# Patient Record
Sex: Female | Born: 1935
Health system: Southern US, Community
[De-identification: ages and names within clinical notes are randomized; demographics above are authoritative.]

## PROBLEM LIST (undated history)

## (undated) DIAGNOSIS — K219 Gastro-esophageal reflux disease without esophagitis: Secondary | ICD-10-CM

## (undated) DIAGNOSIS — E119 Type 2 diabetes mellitus without complications: Secondary | ICD-10-CM

## (undated) DIAGNOSIS — I10 Essential (primary) hypertension: Secondary | ICD-10-CM

## (undated) DIAGNOSIS — T7840XA Allergy, unspecified, initial encounter: Secondary | ICD-10-CM

## (undated) DIAGNOSIS — C50919 Malignant neoplasm of unspecified site of unspecified female breast: Secondary | ICD-10-CM

## (undated) DIAGNOSIS — Z923 Personal history of irradiation: Secondary | ICD-10-CM

## (undated) DIAGNOSIS — Z973 Presence of spectacles and contact lenses: Secondary | ICD-10-CM

## (undated) DIAGNOSIS — M1990 Unspecified osteoarthritis, unspecified site: Secondary | ICD-10-CM

## (undated) HISTORY — PX: TONSILLECTOMY: SUR1361

## (undated) HISTORY — DX: Essential (primary) hypertension: I10

## (undated) HISTORY — PX: KNEE SURGERY: SHX244

## (undated) HISTORY — PX: COLONOSCOPY: SHX174

## (undated) HISTORY — DX: Allergy, unspecified, initial encounter: T78.40XA

## (undated) HISTORY — DX: Unspecified osteoarthritis, unspecified site: M19.90

## (undated) HISTORY — DX: Malignant neoplasm of unspecified site of unspecified female breast: C50.919

## (undated) HISTORY — PX: BREAST LUMPECTOMY: SHX2

## (undated) HISTORY — PX: DILATION AND CURETTAGE OF UTERUS: SHX78

## (undated) HISTORY — DX: Type 2 diabetes mellitus without complications: E11.9

---

## 1998-03-01 ENCOUNTER — Other Ambulatory Visit: Admission: RE | Admit: 1998-03-01 | Discharge: 1998-03-01 | Payer: Self-pay | Admitting: Internal Medicine

## 1998-05-18 ENCOUNTER — Ambulatory Visit (HOSPITAL_COMMUNITY): Admission: RE | Admit: 1998-05-18 | Discharge: 1998-05-18 | Payer: Self-pay | Admitting: Gastroenterology

## 1998-11-24 ENCOUNTER — Emergency Department (HOSPITAL_COMMUNITY): Admission: EM | Admit: 1998-11-24 | Discharge: 1998-11-24 | Payer: Self-pay | Admitting: Emergency Medicine

## 2000-01-02 ENCOUNTER — Encounter: Payer: Self-pay | Admitting: Internal Medicine

## 2000-01-02 ENCOUNTER — Encounter: Admission: RE | Admit: 2000-01-02 | Discharge: 2000-01-02 | Payer: Self-pay | Admitting: Internal Medicine

## 2000-01-09 ENCOUNTER — Encounter: Admission: RE | Admit: 2000-01-09 | Discharge: 2000-01-09 | Payer: Self-pay | Admitting: Internal Medicine

## 2000-01-09 ENCOUNTER — Encounter: Payer: Self-pay | Admitting: Internal Medicine

## 2000-01-15 ENCOUNTER — Encounter: Admission: RE | Admit: 2000-01-15 | Discharge: 2000-04-14 | Payer: Self-pay | Admitting: Internal Medicine

## 2001-08-19 ENCOUNTER — Encounter: Payer: Self-pay | Admitting: Internal Medicine

## 2001-08-19 ENCOUNTER — Encounter: Admission: RE | Admit: 2001-08-19 | Discharge: 2001-08-19 | Payer: Self-pay | Admitting: Internal Medicine

## 2002-11-11 ENCOUNTER — Encounter: Payer: Self-pay | Admitting: Internal Medicine

## 2002-11-11 ENCOUNTER — Encounter: Admission: RE | Admit: 2002-11-11 | Discharge: 2002-11-11 | Payer: Self-pay | Admitting: Internal Medicine

## 2003-05-03 ENCOUNTER — Ambulatory Visit (HOSPITAL_COMMUNITY): Admission: RE | Admit: 2003-05-03 | Discharge: 2003-05-03 | Payer: Self-pay | Admitting: Gastroenterology

## 2003-05-03 ENCOUNTER — Encounter (INDEPENDENT_AMBULATORY_CARE_PROVIDER_SITE_OTHER): Payer: Self-pay

## 2003-10-22 ENCOUNTER — Encounter: Admission: RE | Admit: 2003-10-22 | Discharge: 2003-10-22 | Payer: Self-pay | Admitting: Internal Medicine

## 2004-08-06 HISTORY — PX: FRACTURE SURGERY: SHX138

## 2005-05-14 ENCOUNTER — Encounter: Admission: RE | Admit: 2005-05-14 | Discharge: 2005-05-14 | Payer: Self-pay | Admitting: Internal Medicine

## 2005-05-24 ENCOUNTER — Encounter: Admission: RE | Admit: 2005-05-24 | Discharge: 2005-05-24 | Payer: Self-pay | Admitting: Internal Medicine

## 2006-05-20 ENCOUNTER — Encounter: Admission: RE | Admit: 2006-05-20 | Discharge: 2006-05-20 | Payer: Self-pay | Admitting: Internal Medicine

## 2006-06-03 ENCOUNTER — Encounter: Admission: RE | Admit: 2006-06-03 | Discharge: 2006-06-03 | Payer: Self-pay | Admitting: Internal Medicine

## 2007-05-22 ENCOUNTER — Encounter: Admission: RE | Admit: 2007-05-22 | Discharge: 2007-05-22 | Payer: Self-pay | Admitting: Internal Medicine

## 2008-05-24 ENCOUNTER — Encounter: Admission: RE | Admit: 2008-05-24 | Discharge: 2008-05-24 | Payer: Self-pay | Admitting: Internal Medicine

## 2009-06-02 ENCOUNTER — Encounter: Admission: RE | Admit: 2009-06-02 | Discharge: 2009-06-02 | Payer: Self-pay | Admitting: Internal Medicine

## 2010-06-06 ENCOUNTER — Encounter: Admission: RE | Admit: 2010-06-06 | Discharge: 2010-06-06 | Payer: Self-pay | Admitting: Internal Medicine

## 2010-08-26 ENCOUNTER — Encounter: Payer: Self-pay | Admitting: Internal Medicine

## 2010-12-22 NOTE — Op Note (Signed)
   NAME:  Laurie Fox, Laurie Fox                        ACCOUNT NO.:  [id number]   MEDICAL RECORD NO.:  [id number]                   PATIENT TYPE:  AMB   LOCATION:  ENDO                                 FACILITY:  [facility]   PHYSICIAN:  Danise Edge, M.D.                DATE OF BIRTH:  May 13, 1932   DATE OF PROCEDURE:  05/03/2003  DATE OF DISCHARGE:                                 OPERATIVE REPORT   PROCEDURE:  Screening colonoscopy.   INDICATIONS FOR PROCEDURE:  [name] is a 77 year old female born  [birth year]. In October 1999, [name] underwent a colonoscopy  and polyps were removed. She is scheduled to undergo her second screening  colonoscopy with polypectomy to prevent colon cancer.   ENDOSCOPIST:  Charolett Bumpers, M.D.   PREMEDICATION:  Versed 7 mg, Demerol 50 mg .   DESCRIPTION OF PROCEDURE:  After obtaining informed consent,  [name]  was placed in the left lateral decubitus position. I administered  intravenous Demerol and intravenous Versed to achieve conscious sedation for  the procedure. The patient's blood pressure, oxygen saturation and cardiac  rhythm were monitored throughout the procedure and documented in the medical  record.   Anal inspection was normal. Digital rectal exam was normal. The Olympus  adjustable pediatric colonoscope was introduced into the rectum and advanced  to the cecum. Colonic preparation for the exam today was excellent.   RECTUM:  Normal.   SIGMOID COLON AND DESCENDING COLON:  Normal.   SPLENIC FLEXURE:  Normal.   TRANSVERSE COLON:  Normal.   HEPATIC FLEXURE:  Two diminutive polyps were removed from the hepatic  flexure with the cold biopsy forceps and submitted for pathological  interpretation.   ASCENDING COLON:  Normal.   CECUM AND ILEOCECAL VALVE:  Normal.    ASSESSMENT:  Two diminutive polyps were removed from the hepatic flexure;  otherwise, normal proctocolonoscopy to the cecum.   RECOMMENDATIONS:  Repeat colonoscopy in five years.                                               Danise Edge, M.D.    MJ/MEDQ  D:  05/03/2003  T:  05/03/2003  Job:  [id number]   cc:   Thora Lance, M.D.  [address]  [place]  [place] [address]  Fax: [phone]

## 2011-05-04 ENCOUNTER — Other Ambulatory Visit: Payer: Self-pay | Admitting: Internal Medicine

## 2011-05-04 DIAGNOSIS — Z1231 Encounter for screening mammogram for malignant neoplasm of breast: Secondary | ICD-10-CM

## 2011-06-11 ENCOUNTER — Ambulatory Visit
Admission: RE | Admit: 2011-06-11 | Discharge: 2011-06-11 | Disposition: A | Discharge status: Home / Self Care | Payer: Medicare Other | Source: Ambulatory Visit | Attending: Internal Medicine | Admitting: Internal Medicine

## 2011-06-11 DIAGNOSIS — Z1231 Encounter for screening mammogram for malignant neoplasm of breast: Secondary | ICD-10-CM

## 2012-05-30 ENCOUNTER — Other Ambulatory Visit: Payer: Self-pay | Admitting: Internal Medicine

## 2012-05-30 DIAGNOSIS — Z1231 Encounter for screening mammogram for malignant neoplasm of breast: Secondary | ICD-10-CM

## 2012-07-02 ENCOUNTER — Ambulatory Visit
Admission: RE | Admit: 2012-07-02 | Discharge: 2012-07-02 | Disposition: A | Discharge status: Home / Self Care | Payer: Medicare Other | Source: Ambulatory Visit | Attending: Internal Medicine | Admitting: Internal Medicine

## 2012-07-02 DIAGNOSIS — Z1231 Encounter for screening mammogram for malignant neoplasm of breast: Secondary | ICD-10-CM

## 2013-05-28 ENCOUNTER — Other Ambulatory Visit: Payer: Self-pay

## 2013-05-28 DIAGNOSIS — Z1231 Encounter for screening mammogram for malignant neoplasm of breast: Secondary | ICD-10-CM

## 2013-07-06 ENCOUNTER — Ambulatory Visit
Admission: RE | Admit: 2013-07-06 | Discharge: 2013-07-06 | Disposition: A | Discharge status: Home / Self Care | Payer: Medicare Other | Source: Ambulatory Visit

## 2013-07-06 DIAGNOSIS — Z1231 Encounter for screening mammogram for malignant neoplasm of breast: Secondary | ICD-10-CM

## 2013-07-07 ENCOUNTER — Other Ambulatory Visit: Payer: Self-pay | Admitting: Internal Medicine

## 2013-07-07 DIAGNOSIS — R928 Other abnormal and inconclusive findings on diagnostic imaging of breast: Secondary | ICD-10-CM

## 2013-07-13 ENCOUNTER — Ambulatory Visit
Admission: RE | Admit: 2013-07-13 | Discharge: 2013-07-13 | Disposition: A | Discharge status: Home / Self Care | Payer: Medicare Other | Source: Ambulatory Visit | Attending: Internal Medicine | Admitting: Internal Medicine

## 2013-07-13 ENCOUNTER — Other Ambulatory Visit: Payer: Self-pay | Admitting: Internal Medicine

## 2013-07-13 DIAGNOSIS — R928 Other abnormal and inconclusive findings on diagnostic imaging of breast: Secondary | ICD-10-CM

## 2013-07-15 ENCOUNTER — Other Ambulatory Visit: Payer: Self-pay | Admitting: Internal Medicine

## 2013-07-15 ENCOUNTER — Ambulatory Visit
Admission: RE | Admit: 2013-07-15 | Discharge: 2013-07-15 | Disposition: A | Discharge status: Home / Self Care | Payer: Medicare Other | Source: Ambulatory Visit | Attending: Internal Medicine | Admitting: Internal Medicine

## 2013-07-15 DIAGNOSIS — C50919 Malignant neoplasm of unspecified site of unspecified female breast: Secondary | ICD-10-CM

## 2013-07-15 DIAGNOSIS — R928 Other abnormal and inconclusive findings on diagnostic imaging of breast: Secondary | ICD-10-CM

## 2013-07-15 HISTORY — DX: Malignant neoplasm of unspecified site of unspecified female breast: C50.919

## 2013-07-15 HISTORY — PX: BREAST BIOPSY: SHX20

## 2013-07-16 ENCOUNTER — Ambulatory Visit
Admission: RE | Admit: 2013-07-16 | Discharge: 2013-07-16 | Disposition: A | Discharge status: Home / Self Care | Payer: Medicare Other | Source: Ambulatory Visit | Attending: Internal Medicine | Admitting: Internal Medicine

## 2013-07-16 ENCOUNTER — Other Ambulatory Visit: Payer: Self-pay | Admitting: Internal Medicine

## 2013-07-16 DIAGNOSIS — D0591 Unspecified type of carcinoma in situ of right breast: Secondary | ICD-10-CM

## 2013-07-16 DIAGNOSIS — R928 Other abnormal and inconclusive findings on diagnostic imaging of breast: Secondary | ICD-10-CM

## 2013-07-17 ENCOUNTER — Other Ambulatory Visit: Payer: Self-pay | Admitting: *Deleted

## 2013-07-17 ENCOUNTER — Telehealth: Payer: Self-pay | Admitting: *Deleted

## 2013-07-17 DIAGNOSIS — C50211 Malignant neoplasm of upper-inner quadrant of right female breast: Secondary | ICD-10-CM | POA: Insufficient documentation

## 2013-07-17 NOTE — Telephone Encounter (Signed)
Confirmed [facility] for 07/22/13 at 12N.  Instructions and contact information given. 

## 2013-07-20 ENCOUNTER — Ambulatory Visit
Admission: RE | Admit: 2013-07-20 | Discharge: 2013-07-20 | Disposition: A | Discharge status: Home / Self Care | Payer: Medicare Other | Source: Ambulatory Visit | Attending: Internal Medicine | Admitting: Internal Medicine

## 2013-07-20 DIAGNOSIS — D0591 Unspecified type of carcinoma in situ of right breast: Secondary | ICD-10-CM

## 2013-07-20 MED ORDER — GADOBENATE DIMEGLUMINE 529 MG/ML IV SOLN
20.0000 mL | Freq: Once | INTRAVENOUS | Status: AC | PRN
  Administered 2013-07-20: 20 mL via INTRAVENOUS

## 2013-07-22 ENCOUNTER — Ambulatory Visit: Payer: Medicare Other

## 2013-07-22 ENCOUNTER — Encounter: Payer: Self-pay | Admitting: Oncology

## 2013-07-22 ENCOUNTER — Ambulatory Visit (HOSPITAL_BASED_OUTPATIENT_CLINIC_OR_DEPARTMENT_OTHER): Payer: Medicare Other | Admitting: Oncology

## 2013-07-22 ENCOUNTER — Encounter: Payer: Self-pay | Admitting: *Deleted

## 2013-07-22 ENCOUNTER — Ambulatory Visit: Payer: Medicare Other | Attending: General Surgery | Admitting: Physical Therapy

## 2013-07-22 ENCOUNTER — Ambulatory Visit
Admission: RE | Admit: 2013-07-22 | Discharge: 2013-07-22 | Disposition: A | Discharge status: Home / Self Care | Payer: Medicare Other | Source: Ambulatory Visit | Attending: Radiation Oncology | Admitting: Radiation Oncology

## 2013-07-22 ENCOUNTER — Ambulatory Visit (HOSPITAL_BASED_OUTPATIENT_CLINIC_OR_DEPARTMENT_OTHER): Payer: Medicare Other | Admitting: General Surgery

## 2013-07-22 ENCOUNTER — Other Ambulatory Visit (HOSPITAL_BASED_OUTPATIENT_CLINIC_OR_DEPARTMENT_OTHER): Payer: Medicare Other

## 2013-07-22 VITALS — BP 147/69 | HR 61 | Temp 97.9°F | Resp 20 | Ht 64.0 in | Wt 207.5 lb

## 2013-07-22 DIAGNOSIS — C50219 Malignant neoplasm of upper-inner quadrant of unspecified female breast: Secondary | ICD-10-CM

## 2013-07-22 DIAGNOSIS — C50211 Malignant neoplasm of upper-inner quadrant of right female breast: Secondary | ICD-10-CM

## 2013-07-22 DIAGNOSIS — R293 Abnormal posture: Secondary | ICD-10-CM | POA: Insufficient documentation

## 2013-07-22 DIAGNOSIS — IMO0001 Reserved for inherently not codable concepts without codable children: Secondary | ICD-10-CM | POA: Insufficient documentation

## 2013-07-22 DIAGNOSIS — C50919 Malignant neoplasm of unspecified site of unspecified female breast: Secondary | ICD-10-CM | POA: Insufficient documentation

## 2013-07-22 LAB — COMPREHENSIVE METABOLIC PANEL (CC13)
ALT: 12 U/L (ref 0–55)
AST: 15 U/L (ref 5–34)
Anion Gap: 10 mEq/L (ref 3–11)
BUN: 17.7 mg/dL (ref 7.0–26.0)
CO2: 30 mEq/L — ABNORMAL HIGH (ref 22–29)
Calcium: 10.7 mg/dL — ABNORMAL HIGH (ref 8.4–10.4)
Chloride: 103 mEq/L (ref 98–109)
Glucose: 136 mg/dl (ref 70–140)
Sodium: 143 mEq/L (ref 136–145)
Total Bilirubin: 0.42 mg/dL (ref 0.20–1.20)
Total Protein: 6.7 g/dL (ref 6.4–8.3)

## 2013-07-22 LAB — CBC WITH DIFFERENTIAL/PLATELET
Basophils Absolute: 0 10*3/uL (ref 0.0–0.1)
Eosinophils Absolute: 0.2 10*3/uL (ref 0.0–0.5)
HCT: 36.6 % (ref 34.8–46.6)
LYMPH%: 18.9 % (ref 14.0–49.7)
MONO#: 0.5 10*3/uL (ref 0.1–0.9)
NEUT#: 5.4 10*3/uL (ref 1.5–6.5)
NEUT%: 71.4 % (ref 38.4–76.8)
Platelets: 220 10*3/uL (ref 145–400)
RBC: 4.36 10*6/uL (ref 3.70–5.45)
WBC: 7.6 10*3/uL (ref 3.9–10.3)
lymph#: 1.4 10*3/uL (ref 0.9–3.3)

## 2013-07-22 NOTE — Progress Notes (Signed)
Laurie Fox [id number] 29-Sep-1931 80 y.o. 07/22/2013 2:56 PM  CC  Laurie Mountain, MD [address] [place] [address] Laurie Fox Dr. Everardo Fox REASON FOR CONSULTATION:  79 year old female with invasive ductal carcinoma/ductal carcinoma in situ of the right breast. Patient is seen in the breast clinic for discussion of treatment options  STAGE:   Breast cancer of upper-inner quadrant of right female breast   Primary site: Breast (Right)   Staging method: AJCC 7th Edition   Clinical: Stage IA (T1c, N0, cM0)   Summary: Stage IA (T1c, N0, cM0)  REFERRING PHYSICIAN: Dr. Everardo Fox  HISTORY OF PRESENT ILLNESS:  Laurie Fox is a 79 y.o. female.  Medical history significant for diabetes and hyperlipidemia as well as hypertension. Patient had an annual screening mammogram. In her right breast she was noted to have a mass at the 1:00 position. By ultrasound it measured 7 mm. MRI reveals 1.3 cm area postbiopsy changes. Core needle biopsy of the mass at 1:00 position revealed a grade 1/2 invasive ductal carcinoma/DCIS. Tumor was ER positive PR positive HER-2/neu negative with a proliferation marker Ki-67 30%. Her case was discussed at the multidisciplinary breast conference. She is now seen in the clinic for discussion of treatment options. She was also seen by Dr. Donell Fox and Dr. Dorothy Fox. She is without any complaints related to her breast cancer.   Past Medical History: Past Medical History  Diagnosis Date  . Diabetes mellitus without complication   . Hypertension   . Arthritis     Past Surgical History: Past Surgical History  Procedure Laterality Date  . Knee surgery      Family History: Family History  Problem Relation Age of Onset  . Alzheimer's disease Mother   . Colon cancer Father     Social History History  Substance Use Topics  . Smoking status: Never Smoker   . Smokeless tobacco: Not on file  . Alcohol Use: No     Allergies: Allergies  Allergen Reactions  . Codeine   . Sulfa Antibiotics     Current Medications: Current Outpatient Prescriptions  Medication Sig Dispense Refill  . acetaminophen (TYLENOL) 325 MG tablet Take 650 mg by mouth every 6 (six) hours as needed.      Marland Kitchen amLODipine (NORVASC) 10 MG tablet Take 10 mg by mouth daily.      Marland Kitchen aspirin 81 MG tablet Take 81 mg by mouth daily.      . carboxymethylcellulose (REFRESH PLUS) 0.5 % SOLN 1 drop 3 (three) times daily as needed.      . carvedilol (COREG) 25 MG tablet Take 25 mg by mouth 2 (two) times daily with a meal.      . cycloSPORINE (RESTASIS) 0.05 % ophthalmic emulsion 1 drop 2 (two) times daily.      Marland Kitchen MELATONIN PO Take 2.5 mg by mouth. 1/2 tablet once a day      . metFORMIN (GLUCOPHAGE) 500 MG tablet Take 500 mg by mouth 2 (two) times daily with a meal. 3 tablets in am, 2 tablets in evening      . Multiple Vitamin (MULTI-VITAMIN DAILY PO) Take by mouth.      Marland Kitchen omeprazole (PRILOSEC) 20 MG capsule Take 20 mg by mouth daily.      . simvastatin (ZOCOR) 20 MG tablet Take 20 mg by mouth daily.      Marland Kitchen telmisartan-hydrochlorothiazide (MICARDIS HCT) 40-12.5 MG per tablet Take 1 tablet by mouth daily.      Marland Kitchen  vitamin B-12 (CYANOCOBALAMIN) 100 MCG tablet Take 100 mcg by mouth daily.      Marland Kitchen VITAMIN D, ERGOCALCIFEROL, PO Take by mouth.       No current facility-administered medications for this visit.    OB/GYN History: Patient had menarche at age 46 she underwent menopause at 19 she has never been on hormone replacement therapy first live birth was at 56. She has taken birth control pills in the past.  Fertility Discussion: Not applicable Prior History of Cancer: No  Health Maintenance:  Colonoscopy 2013 Bone Density 2010 Last PAP smear 1995  ECOG PERFORMANCE STATUS: 0 - Asymptomatic  Genetic Counseling/testing: no  REVIEW OF SYSTEMS:  The 14 point comprehensive review of system was obtained and this can separately into the  electronic medical record  PHYSICAL EXAMINATION: Blood pressure 147/69, pulse 61, temperature 97.9 F (36.6 C), temperature source Oral, resp. rate 20, height 5\' 4"  (1.626 m), weight 207 lb 8 oz (94.121 kg).  WUJ:WJXBJ, healthy, no distress, well nourished and well developed SKIN: skin color, texture, turgor are normal HEAD: Normocephalic EYES: PERRLA, EOMI, Conjunctiva are pink and non-injected EARS: External ears normal, Canals clear OROPHARYNX:no exudate  NECK: no adenopathy LYMPH:  no palpable lymphadenopathy BREAST:breasts appear normal, no suspicious masses, no skin or nipple changes or axillary nodes LUNGS: clear to auscultation and percussion HEART: regular rate & rhythm ABDOMEN:abdomen soft, non-tender, normal bowel sounds and no masses or organomegaly BACK: No CVA tenderness EXTREMITIES:no edema, no clubbing, no cyanosis  NEURO: alert & oriented x 3 with fluent speech, no focal motor/sensory deficits, gait normal     STUDIES/RESULTS: US Breast Right  07/13/2013   ADDENDUM REPORT: 07/13/2013 13:19  ADDENDUM: No sonographically identified right axillary lymphadenopathy.   Electronically Signed   By: Annia Belt M.D.   On: 07/13/2013 13:19   07/13/2013   CLINICAL DATA:  Patient recalled from screening for right breast mass.  EXAM: DIGITAL DIAGNOSTIC  RIGHT MAMMOGRAM WITH CAD  ULTRASOUND RIGHT BREAST  COMPARISON:  [facility]  ACR Breast Density Category b: There are scattered areas of fibroglandular density.  FINDINGS: Spot compression views of the right breast 1 o'clock position middle to posterior depth demonstrates a persistent 1 cm irregular mass.  Mammographic images were processed with CAD.  On physical exam,I palpate no discrete mass within the 1 o'clock position middle to posterior depth right breast.  Ultrasound is performed, showing a 7 x 7 x 5 mm irregular hypoechoic mass within the right breast 1 o'clock position 5 cm from the nipple.  IMPRESSION: Suspicious right breast  mass. Ultrasound-guided core needle biopsy is recommended.  RECOMMENDATION: Ultrasound-guided core needle biopsy right breast mass. This is scheduled for July 15, 2013.  I have discussed the findings and recommendations with the patient. Results were also provided in writing at the conclusion of the visit.  BI-RADS CATEGORY  4: Suspicious abnormality - biopsy should be considered.  Electronically Signed: By: Annia Belt M.D. On: 07/13/2013 13:14   Mr Breast Bilateral W Wo Contrast  07/21/2013   CLINICAL DATA:  Recently diagnosed right breast invasive mammary carcinoma and mammary carcinoma in situ.  EXAM: BILATERAL BREAST MRI WITH AND WITHOUT CONTRAST  LABS:  None obtained today.  TECHNIQUE: Multiplanar, multisequence MR images of both breasts were obtained prior to and following the intravenous administration of 20ml of MultiHance.  THREE-DIMENSIONAL MR IMAGE RENDERING ON INDEPENDENT WORKSTATION:  Three-dimensional MR images were rendered by post-processing of the original MR data on an independent workstation. The three-dimensional  MR images were interpreted, and findings are reported in the following complete MRI report for this study. Three dimensional images were evaluated at the independent DynaCad workstation  COMPARISON:  Previous exams  FINDINGS: Breast composition: b. Scattered fibroglandular tissue  Background parenchymal enhancement: Mild  Right breast: Biopsy marker clip artifact and biopsy tract in the upper inner right breast with a small amount of surrounding enhancement measuring 1.3 x 0.9 x 0.9 cm.  Left breast: No mass or abnormal enhancement.  Lymph nodes: No abnormal appearing lymph nodes.  Ancillary findings:  None.  IMPRESSION: 1.3 x 0.9 x 0.9 cm area of post biopsy change and possible minimal residual malignancy in the upper inner right breast. Otherwise, unremarkable examination.  RECOMMENDATION: Treatment plan.  BI-RADS CATEGORY  6: Known biopsy-proven malignancy - appropriate  action should be taken.   Electronically Signed   By: Gordan Payment M.D.   On: 07/21/2013 10:08   Mm Digital Diag Ltd R  07/13/2013   ADDENDUM REPORT: 07/13/2013 13:19  ADDENDUM: No sonographically identified right axillary lymphadenopathy.   Electronically Signed   By: Annia Belt M.D.   On: 07/13/2013 13:19   07/13/2013   CLINICAL DATA:  Patient recalled from screening for right breast mass.  EXAM: DIGITAL DIAGNOSTIC  RIGHT MAMMOGRAM WITH CAD  ULTRASOUND RIGHT BREAST  COMPARISON:  [facility]  ACR Breast Density Category b: There are scattered areas of fibroglandular density.  FINDINGS: Spot compression views of the right breast 1 o'clock position middle to posterior depth demonstrates a persistent 1 cm irregular mass.  Mammographic images were processed with CAD.  On physical exam,I palpate no discrete mass within the 1 o'clock position middle to posterior depth right breast.  Ultrasound is performed, showing a 7 x 7 x 5 mm irregular hypoechoic mass within the right breast 1 o'clock position 5 cm from the nipple.  IMPRESSION: Suspicious right breast mass. Ultrasound-guided core needle biopsy is recommended.  RECOMMENDATION: Ultrasound-guided core needle biopsy right breast mass. This is scheduled for July 15, 2013.  I have discussed the findings and recommendations with the patient. Results were also provided in writing at the conclusion of the visit.  BI-RADS CATEGORY  4: Suspicious abnormality - biopsy should be considered.  Electronically Signed: By: Annia Belt M.D. On: 07/13/2013 13:14   Mm Digital Diagnostic Unilat R  07/15/2013   CLINICAL DATA:  Status post ultrasound-guided core needle biopsy of a 7 mm 1 o'clock right breast mass.  EXAM: DIAGNOSTIC RIGHT MAMMOGRAM POST ULTRASOUND BIOPSY  COMPARISON:  Previous exams  FINDINGS: Mammographic images were obtained following ultrasound guided biopsy of a 7 mm mass in the 1 o'clock position of the right breast. These demonstrate a bow tie shaped biopsy  marker clip at the location of the biopsied mass.  IMPRESSION: Appropriate clip deployment following right breast ultrasound-guided core needle biopsy.  Final Assessment: Post Procedure Mammograms for Marker Placement   Electronically Signed   By: Gordan Payment M.D.   On: 07/15/2013 15:48   Mm Digital Screening  07/06/2013   CLINICAL DATA:  Screening.  EXAM: DIGITAL SCREENING BILATERAL MAMMOGRAM WITH CAD  COMPARISON:  Previous exam(s)  ACR Breast Density Category b: There are scattered areas of fibroglandular density.  FINDINGS: In the right breast, a possible mass warrants further evaluation with spot compression views and possibly ultrasound. In the left breast, no masses or malignant type calcifications are identified. Images were processed with CAD.  IMPRESSION: Further evaluation is suggested for possible mass in the right  breast.  RECOMMENDATION: Diagnostic mammogram and possibly ultrasound of the right breast. (Code:[id number])  The patient will be contacted regarding the findings, and additional imaging will be scheduled.  BI-RADS CATEGORY  0: Incomplete. Need additional imaging evaluation and/or prior mammograms for comparison.   Electronically Signed   By: Amie Portland M.D.   On: 07/06/2013 16:55   Mm Radiologist Eval And Mgmt  07/16/2013   EXAM: ESTABLISHED PATIENT OFFICE VISIT - LEVEL II ([id number])  EXAM: There is firmness to palpation in the upper inner right breast superior to the biopsy site, measuring approximately 4 cm in diameter. Minimal bruising. No significant tenderness to palpation.  HISTORY OF PRESENT ILLNESS: 7 mm mass in the 1 o'clock position of the right breast at recent mammography and ultrasound, biopsied under ultrasound guidance yesterday.  CHIEF COMPLAINT: Status post ultrasound-guided core needle biopsy of a 7 mm mass in the 1 o'clock position of the right breast. No pain reported at the biopsy site.  PATHOLOGY: Invasive mammary carcinoma and mammary carcinoma in situ.  ASSESSMENT  AND PLAN: ASSESSMENT AND PLAN The pathological diagnosis is concordant with the imaging findings. The patient was given an appointment for a bilateral breast MRI with and without contrast at [facility] at [address] at 10:15 a.m. on 07/20/2013. She will be contacted by the Multidisciplinary Clinic for an appointment.   Electronically Signed   By: Gordan Payment M.D.   On: 07/16/2013 15:29   [place] Rt Breast Bx W Loc Dev 1st Lesion Img Bx Spec US Guide  07/15/2013   CLINICAL DATA:  7 mm mass in the 1 o'clock position of the right breast with imaging features suspicious for malignancy.  EXAM: ULTRASOUND GUIDED RIGHT BREAST CORE NEEDLE BIOPSY WITH VACUUM ASSIST  COMPARISON:  Previous exams.  PROCEDURE: I met with the patient and we discussed the procedure of ultrasound-guided biopsy, including benefits and alternatives. We discussed the high likelihood of a successful procedure. We discussed the risks of the procedure including infection, bleeding, tissue injury, clip migration, and inadequate sampling. Informed written consent was given. The usual time-out protocol was performed immediately prior to the procedure.  Using sterile technique and 2% Lidocaine as local anesthetic, under direct ultrasound visualization, a 12 gauge vacuum-assisteddevice was used to perform biopsy of the recently demonstrated 7 mm mass in the 1 o'clock position of the right breast using an inferomedial approach. At the conclusion of the procedure, a bow tie tissue marker clip was deployed into the biopsy cavity. Follow-up 2-view mammogram was performed and dictated separately.  IMPRESSION: Ultrasound-guided biopsy of a 7 mm 1 o'clock right breast mass. No apparent complications.   Electronically Signed   By: Gordan Payment M.D.   On: 07/15/2013 15:19     LABS:    Chemistry      Component Value Date/Time   NA 143 07/22/2013 1206   K 3.7 07/22/2013 1206   CO2 30* 07/22/2013 1206   BUN 17.7 07/22/2013 1206   CREATININE  0.8 07/22/2013 1206      Component Value Date/Time   CALCIUM 10.7* 07/22/2013 1206   ALKPHOS 72 07/22/2013 1206   AST 15 07/22/2013 1206   ALT 12 07/22/2013 1206   BILITOT 0.42 07/22/2013 1206      Lab Results  Component Value Date   WBC 7.6 07/22/2013   HGB 12.1 07/22/2013   HCT 36.6 07/22/2013   MCV 84.1 07/22/2013   PLT 220 07/22/2013       PATHOLOGY: PROGNOSTIC INDICATORS -  ACIS Results: IMMUNOHISTOCHEMICAL AND MORPHOMETRIC ANALYSIS BY THE AUTOMATED CELLULAR IMAGING SYSTEM (ACIS) Estrogen Receptor: 100%, POSITIVE, STRONG STAINING INTENSITY Progesterone Receptor: 100%, POSITIVE, STRONG STAINING INTENSITY Proliferation Marker Ki67: 25% REFERENCE RANGE ESTROGEN RECEPTOR NEGATIVE <1% POSITIVE =>1% PROGESTERONE RECEPTOR NEGATIVE <1% POSITIVE =>1% All controls stained appropriately Jimmy Picket MD Pathologist, Electronic Signature ( Signed 07/22/2013) CHROMOGENIC IN-SITU HYBRIDIZATION Results: HER-2/NEU BY CISH - NO AMPLIFICATION OF HER-2 DETECTED. RESULT RATIO OF HER2: CEP 17 SIGNALS 1.40 AVERAGE HER2 COPY NUMBER PER CELL 2.45 REFERENCE RANGE 1 of 3 FINAL for ICY, FUHRMANN ([id number]) ADDITIONAL INFORMATION:(continued) NEGATIVE HER2/Chr17 Ratio <2.0 and Average HER2 copy number <4.0 EQUIVOCAL HER2/Chr17 Ratio <2.0 and Average HER2 copy number 4.0 and <6.0 POSITIVE HER2/Chr17 Ratio >=2.0 and/or Average HER2 copy number >=6.0  ASSESSMENT    79 year old female with new diagnosis of stage I (T1 NX MX) invasive ductal carcinoma with ductal carcinoma in situ of the  right breast measuring 7 mm on ultrasound 1.3 cm on MRI. Tumor was ER positive PR positive HER-2/neu negative with a proliferation marker Ki-67 30%. Patient is a good breast conservation candidate. We have discussed lumpectomy with sentinel lymph node biopsy. She will also need radiation therapy. We discussed rationale for antiestrogen therapy. We discussed pathophysiology of breast cancer and  multimodality treatment options. We discussed side effects from antiestrogen therapy. We discussed tamoxifen and aromatase inhibitors today. We discussed the side effects risks benefits. Currently patient would be a good candidate for tamoxifen or Arimidex. My initial impression is that patient will not need chemotherapy. However I would like to see her final pathology before making that definitive decision. She has a significant invasive component she may need Oncotype DX testing.  Clinical Trial Eligibility: no  Multidisciplinary conference discussion yes     PLAN:     #1 proceed with lumpectomy with sentinel lymph node biopsy.  #2 I will see the patient back after the surgery to discuss radiation as well as systemic treatment.       Discussion: Patient is being treated per NCCN breast cancer care guidelines appropriate for stage.1   Thank you so much for allowing me to participate in the care of Kelsee S Gallardo. I will continue to follow up the patient with you and assist in her care.  All questions were answered. The patient knows to call the clinic with any problems, questions or concerns. We can certainly see the patient much sooner if necessary.  I spent 30 minutes counseling the patient face to face. The total time spent in the appointment was 60 minutes.  Drue Second, MD Medical/Oncology [facility] [phone] (beeper) [phone] (Office)  07/22/2013, 2:56 PM

## 2013-07-22 NOTE — Assessment & Plan Note (Signed)
Patient has new diagnosis of cT1N0 right breast cancer. She is a good candidate for breast conservation. We will schedule her for right lumpectomy and sentinel lymph node biopsy.  The surgical procedure was described to the patient.  I discussed the incision type and location and that we would need radiology involved on the day of surgery with a wire marker and sentinel node.      The risks and benefits of the procedure were described to the patient and she wishes to proceed.    We discussed the risks bleeding, infection, damage to other structures, need for further procedures/surgeries.  We discussed the risk of seroma.  The patient was advised if the margins are positive, we may need to go back to surgery for additional tissue. The patient was advised that these are the most common complications, but that others can occur as well.  They were advised against taking aspirin or other anti-inflammatory agents/blood thinners the week before surgery.    45 min spent in counseling, exam, evaluation, and coordination of care.  >50% spent in counseling.

## 2013-07-22 NOTE — Progress Notes (Signed)
Checked in new patient with no financial issues and she has appt card and [facility] packet. She has not been to [place].

## 2013-07-22 NOTE — Progress Notes (Signed)
Chief complaint:  Right breast cancer    HISTORY: Patient is a 79-year-old female with a new diagnosis of right breast cancer. She presented with screening detected right breast mass at 1:00. This was 7 mm on mammography and ultrasound. On MRI it measured 1.3 cm and the MRI was negative for other concerning lesions.  The cancer was ER/PR positive Ki-67 was 30%. HER-2 was not overexpressed. She did have invasive mammary and in situ carcinoma.  Her father had colon cancer, but she has no other family cancer history.  Menarche was at age 14.  She had menopause at age 54.  She had 2 children, with the first at age 21.  She did use OCPs for 6 years.  She has not used HRT.  She is up to date with bone density and colonoscopy.    Past Medical History  Diagnosis Date  . Diabetes mellitus without complication   . Hypertension   . Arthritis     Past Surgical History  Procedure Laterality Date  . Knee surgery      Current Outpatient Prescriptions  Medication Sig Dispense Refill  . acetaminophen (TYLENOL) 325 MG tablet Take 650 mg by mouth every 6 (six) hours as needed.      . amLODipine (NORVASC) 10 MG tablet Take 10 mg by mouth daily.      . aspirin 81 MG tablet Take 81 mg by mouth daily.      . carboxymethylcellulose (REFRESH PLUS) 0.5 % SOLN 1 drop 3 (three) times daily as needed.      . carvedilol (COREG) 25 MG tablet Take 25 mg by mouth 2 (two) times daily with a meal.      . cycloSPORINE (RESTASIS) 0.05 % ophthalmic emulsion 1 drop 2 (two) times daily.      . MELATONIN PO Take 2.5 mg by mouth. 1/2 tablet once a day      . metFORMIN (GLUCOPHAGE) 500 MG tablet Take 500 mg by mouth 2 (two) times daily with a meal. 3 tablets in am, 2 tablets in evening      . Multiple Vitamin (MULTI-VITAMIN DAILY PO) Take by mouth.      . omeprazole (PRILOSEC) 20 MG capsule Take 20 mg by mouth daily.      . simvastatin (ZOCOR) 20 MG tablet Take 20 mg by mouth daily.      . telmisartan-hydrochlorothiazide  (MICARDIS HCT) 40-12.5 MG per tablet Take 1 tablet by mouth daily.      . vitamin B-12 (CYANOCOBALAMIN) 100 MCG tablet Take 100 mcg by mouth daily.      . VITAMIN D, ERGOCALCIFEROL, PO Take by mouth.       No current facility-administered medications for this visit.     Allergies  Allergen Reactions  . Codeine   . Sulfa Antibiotics      Family History  Problem Relation Age of Onset  . Alzheimer's disease Mother   . Colon cancer Father      History   Social History  . Marital Status: Widowed    Spouse Name: N/A    Number of Children: N/A  . Years of Education: N/A   Social History Main Topics  . Smoking status: Never Smoker   . Smokeless tobacco: Not on file  . Alcohol Use: No  . Drug Use: No  . Sexual Activity: Not Currently   Other Topics Concern  . Not on file   Social History Narrative  . No narrative on file       REVIEW OF SYSTEMS - PERTINENT POSITIVES ONLY: 12 point review of systems negative other than HPI and PMH except for knee pain, dry eyes, runny nose, dry cough, incontinence, back pain, goint pain, arthritis, difficulty walking.  Diabetes.    EXAM: Wt Readings from Last 3 Encounters:  07/22/13 207 lb 8 oz (94.121 kg)   Temp Readings from Last 3 Encounters:  07/22/13 97.9 F (36.6 C) Oral   BP Readings from Last 3 Encounters:  07/22/13 147/69   Pulse Readings from Last 3 Encounters:  07/22/13 61   Gen:  No acute distress.  Well nourished and well groomed.  Looks younger than stated age. Neurological: Alert and oriented to person, place, and time. Coordination normal.  Head: Normocephalic and atraumatic.  Eyes: Conjunctivae are normal. Pupils are equal, round, and reactive to light. No scleral icterus.  Neck: Normal range of motion. Neck supple. No tracheal deviation or thyromegaly present.  Breast:  Dense breast tissue.  No palpable masses.  Small hematoma on right breast.  No nipple retraction or discharge.  No skin dimpling.  No LAD.    Cardiovascular: Normal rate, regular rhythm, normal heart sounds and intact distal pulses.  Exam reveals no gallop and no friction rub.  No murmur heard. Respiratory: Effort normal.  No respiratory distress. No chest wall tenderness. Breath sounds normal.  No wheezes, rales or rhonchi.  GI: Soft. Bowel sounds are normal. The abdomen is soft and nontender.  There is no rebound and no guarding.  Musculoskeletal: Normal range of motion. Extremities are nontender.  Lymphadenopathy: No cervical, preauricular, postauricular or axillary adenopathy is present Skin: Skin is warm and dry. No rash noted. No diaphoresis. No erythema. No pallor. No clubbing, cyanosis, or edema.   Psychiatric: Normal mood and affect. Behavior is normal. Judgment and thought content normal.    LABORATORY RESULTS: Available labs are reviewed  Diagnosis Breast, right, needle core biopsy, mass, 1 o' clock - INVASIVE MAMMARY CARCINOMA - MAMMARY CARCINOMA IN SITU. - SEE COMMENTS. CMET, CBC essentially normal other than  Ca 10.7  RADIOLOGY RESULTS: See E-Chart or I-Site for most recent results.  Images and reports are reviewed. MRI IMPRESSION:  1.3 x 0.9 x 0.9 cm area of post biopsy change and possible minimal  residual malignancy in the upper inner right breast. Otherwise,  unremarkable examination  Dx mammo Ultrasound is performed, showing a 7 x 7 x 5 mm irregular hypoechoic  mass within the right breast 1 o'clock position 5 cm from the  nipple.  IMPRESSION:  Suspicious right breast mass. Ultrasound-guided core needle biopsy  is recommended.   ASSESSMENT AND PLAN: Breast cancer of upper-inner quadrant of right female breast Patient has new diagnosis of cT1N0 right breast cancer. She is a good candidate for breast conservation. We will schedule her for right lumpectomy and sentinel lymph node biopsy.  The surgical procedure was described to the patient.  I discussed the incision type and location and that we  would need radiology involved on the day of surgery with a wire marker and sentinel node.      The risks and benefits of the procedure were described to the patient and she wishes to proceed.    We discussed the risks bleeding, infection, damage to other structures, need for further procedures/surgeries.  We discussed the risk of seroma.  The patient was advised if the margins are positive, we may need to go back to surgery for additional tissue. The patient was advised that these are the most   common complications, but that others can occur as well.  They were advised against taking aspirin or other anti-inflammatory agents/blood thinners the week before surgery.    45 min spent in counseling, exam, evaluation, and coordination of care.  >50% spent in counseling.        Frantz Quattrone L Suprena Travaglini MD Surgical Oncology, General and Endocrine Surgery [facility]      Visit Diagnoses: 1. Breast cancer of upper-inner quadrant of right female breast     Primary Care Physician: GRIFFIN,JOHN JOSEPH, MD  [facility], med onc [place], rad onc.     

## 2013-07-22 NOTE — Patient Instructions (Signed)
Breast Cancer, Early Stage, Surgery Choices Breast cancer is the most common cancer, except for skin cancer. It is the second most common cause of death, except for lung cancer, in women. The risk of a woman developing breast cancer is 12.5%. Breast cancer in women younger than 77 years old is rare. Most of these cancer cases have spread to the breast from another part of the body (metastatic). Finding out that you have breast cancer is an emotional shock and is difficult to understand, because it is an overwhelming, serious, and life-threatening disease. You will need to make important decisions about how you want it treated.  As a woman with early-stage breast cancer (DCIS or Stage I, IIA, IIB, IIIA, or IV) you may be able to choose which type of breast surgery to have. Your caregiver will help you understand what stage you are in. Often, your choices are:  Removal of the cancerous part(s) of the breast (breast-sparing surgery).  Lumpectomy.  Partial/Segmental mastectomy.  Removal of the whole breast (simple mastectomy). Research shows that women with early-stage breast cancer who have breast-sparing surgery along with radiation therapy live as long as those who have a mastectomy. Most women with breast cancer will lead long, healthy lives after treatment.  Reconstructive surgery. This type of surgery is to make the breast and nipple area as normal looking as it was before the mastectomy. It is usually done by a plastic surgeon at the same time as the mastectomy. There are several types of reconstructive surgery that should be discussed with your caregiver and plastic surgeon.  Lymph node surgery.  Sentinel (removing those lymph nodes in the armpit that breast cancer spreads to first).  Axillary lymph node dissection (removing all the lymph nodes in the armpit). TREATMENT Treatment for breast cancer usually begins a few weeks after diagnosis. In these weeks, you should meet with a surgeon,  learn the facts about your surgery choices, treatment options, get a second opinion, and think about what is important to you.  Treatment choices include:  Surgery.  Radiation.  Chemotherapy.  Medicines, hormones.  Reconstructive breast surgery.  Any combination of the above treatments. Choose which kind of surgery to have. If radiation, chemotherapy, or hormone therapy is considered, this also should be discussed before the surgery. Radical breast surgery is rarely performed now. Usually, lumpectomy, partial/segmental mastectomy, simple mastectomy in combination with radiation, chemotherapy, and/or hormone treatment is recommended. Clinical trial protocols (specific treatment plan with surgery and radiation, chemotherapy, and hormones) for breast cancer have been put in place in hospitals, universities, medical schools, and cancer centers all over the country. These patients are followed for several years to find out what treatment gives the best results for the protocol given, for the different stages of breast cancer.  Most women want to make this choice with help from their caregiver. After all, the kind of surgery you have will affect how you look and feel. But it is often hard to decide what to do. The more information you have, the better the decision you can make.  Talk to a surgeon about your breast cancer surgery choices. Find out what happens during surgery, types of problems that sometimes occur, and other kinds of treatment (if any) you will need after surgery. Be sure to ask a lot of questions and learn as much as you can. You may also wish to talk with family members, friends, or others who have had breast cancer surgery, radiation therapy, chemotherapy, or hormone therapy.    After talking with a surgeon, you may want a second opinion. This means talking with another doctor or surgeon who might tell you about other treatment options. They may simply give you information that can help  you feel better about the choice you are making. Do not worry about hurting your surgeon's feelings. It is common practice to get a second opinion, and some insurance companies require it. Plus, it is better to get a second opinion than to worry that you have made the wrong choice. STAGES OF BREAST CANCER Staging of breast cancer depends on the size of the tumor, if and how far it has spread, lymph node involvement in the armpits, and whether it has spread to other parts of the body. If you are unsure of the stage of your cancer, ask your caregiver. The following are the stages of breast cancer:  Stage 0: This means you either have DCIS or LCIS.  DCIS (Ductal Carcinoma in Situ) is very early breast cancer. It is often too small to form a lump. Your caregiver may refer to DCIS as noninvasive cancer.  LCIS (Lobular Carcinoma in Situ) is not cancer, but it may increase the chance that you will get breast cancer. Talk with your caregiver about treatment options, if you are diagnosed with LCIS.  The 5 year survival rate in this stage is almost 100%.  Stage I:  Your cancer is less than 1 inch across (2 centimeters) or about the size of a quarter. The cancer is only in the breast. It has not spread to lymph nodes or other parts of your body.  Stage IIA:  No cancer is found in your breast. However, cancer is found in the lymph nodes under your arm, or  Your cancer is 1 inch (2 centimeters) or smaller. It has spread to 3 lymph nodes or less (the lymph nodes under your arm), or  Your cancer is about 1-2 inches (2-5 centimeters). It has not spread to the lymph nodes under your arm.  Stage IIB:  Your cancer is about 1-2 inches (2-5 centimeters). It has spread to the lymph nodes under your arm, or  Your cancer is larger than 2 inches (5 centimeters). It has not spread to the lymph nodes under your arm.  The 5 year survival rate is 85 to 90%.  Stage IIIA:  No cancer is found in the breast. It is  found in lymph nodes under your arm. The lymph nodes are attached to each other, or  Your cancer is 2 inches (5 centimeters) or smaller. It has spread to lymph nodes under your arm. The lymph nodes are attached to each other, or  Your cancer is larger than 2 inches (5 centimeters) and has spread to lymph nodes under your arm, and they are not attached to each other.  Your cancer is smaller than 2 inches (5 centimeters) and has spread to lymph nodes above your collarbone.  Inflammatory cancer of the breast (red rash, tender and swollen breasts) is in the stage III category.  The 5 year survival rate is 45 to 67%.  Stage IV:  Cancer cells have spread to other parts of the body.  The 5 year survival rate is about 20%. ABOUT LYMPH NODES  Lymph nodes are part of your body's immune system, which helps fight infection and disease. Lymph nodes are small, round, and clustered (like a bunch of grapes) throughout your body.  Axillary lymph nodes are in the area under your arm. Breast cancer   may spread to these lymph nodes first, even when the tumor in the breast is small. This is why most surgeons take out some of these lymph nodes at the time of breast surgery.  Lymphedema is swelling caused by a buildup of lymph fluid. You may have this type of swelling in your arm, if your lymph nodes are taken out with surgery or damaged by radiation therapy.  Lymphedema can show up soon after surgery. The symptoms are often mild and last for a short time.  Lymphedema can show up months or even years after cancer treatment is over. Often, lymphedema develops after an insect bite, minor injury, or burn on the arm where your lymph nodes were removed. Sometimes, this can be painful. One way to reduce the swelling is to work with a caregiver who specializes in rehabilitation, or with a physical therapist.  Sentinel lymph node biopsy is surgery to remove as few lymph nodes as possible from under the arm. The surgeon  first injects a dye in the breast to see which lymph nodes the breast tumor drains into. Then, he or she removes these nodes to see if they have any cancer. If there is no cancer, the surgeon may leave the other lymph nodes in place. This surgery is fairly new and is being studied experimentally. Talk with your surgeon if you want to learn more. COMPARE YOUR CHOICES  BREAST-SPARING SURGERY  Is this surgery right for me?   Breast-sparing surgery with radiation is a safe choice for most women who have early-stage breast cancer. This means that your cancer is DCIS or at Stage I, IIA, IIB, or IIIA. What are the names of the different kinds of surgery?  Lumpectomy.  Partial mastectomy.  Breast-sparing surgery.  Segmental mastectomy.  Simple mastectomy.  Sentinel lymph node removal.  Axillary lymph node excision. What doctors am I likely to see?  Oncologist.  Surgeon.  Radiation Oncologist.  Chemotherapy Oncologist. What will my breast look like after surgery?  Your breast should look a lot like it did before surgery. But if your tumor is large, your breast may look different or smaller after breast-sparing surgery. Will I have feeling in the area around my breast?  Yes. You should still have feeling in your breast, nipple, and areola (dark area around your nipple). Will I have pain after the surgery?  You may have pain after surgery. Talk with your caregiver about ways to control this pain. What other problems can I expect?  You may feel very tired after radiation therapy. You may get swelling in your arm (lymphedema). Will I need more surgery?  Maybe. You may need more surgery to remove lymph nodes from under your arm. Also, if the surgeon does not remove all your cancer the first time, you may need more surgery. What other types of treatment will I need?  You may need radiation therapy, given almost every day for 5 to 8 weeks. You may also need chemotherapy, hormone  therapy, or both. Will insurance pay for my surgery?  Check with your insurance company to find out how much it pays for breast cancer surgery and other needed treatments. Will the type of surgery I have affect how long I live?  Women with early-stage breast cancer who have breast-sparing surgery followed by radiation live just as long as women who have a mastectomy. Most women with breast cancer will lead long, healthy lives after treatment. What are the chances that my cancer will come back after   surgery?  About 10% of women who have breast-sparing surgery along with radiation therapy get cancer in the same breast within 12 years. If this happens, you will need a mastectomy, but it will not affect how long you live. MASTECTOMY SURGERY Is this surgery right for me?   Mastectomy is a safe choice for women who have early-stage breast cancer (DCIS, Stage I, IIA, IIB, or IIIA). You may need a mastectomy if:  You have small breasts and a large tumor.  You have cancer in more than one part of your breast.  The tumor is under the nipple.  You do not have access to radiation therapy. What are the names of the different kinds of surgery?  Total mastectomy.  Modified radical mastectomy (not usually done now).  Double mastectomy.  Simple mastectomy. What doctors am I likely to see?  Oncologist.  Surgeon.  Radiation Oncologist.  Chemotherapy Oncologist. What will my breast look like after surgery?  Your breast and nipple will be removed. You will have a flat chest on the side of your body where the breast was removed. Will I have feeling in the area around my breast?  Maybe. After surgery, you will have no feeling (numb) in your chest wall and maybe also under your arm. This numb feeling should go away in 1-2 years, but it will never feel like it did before. Also, the skin where your breast was may feel tight. Will I have pain after the surgery?  You may have pain after surgery.  Talk with your caregiver about ways to control this pain. What other problems can I expect?  You may have pain in your neck or back. You may feel out of balance, if you had large breasts and do not have reconstruction surgery. You may get swelling in your arm (lymphedema). Will I need more surgery?  Maybe. You may need surgery to remove lymph nodes from under your arm. Also, if you have problems after your mastectomy, you may need to see your surgeon for treatment. What other types of treatment will I need?  You may also need chemotherapy, hormone therapy, or radiation therapy. Some women get all 3 types of therapy or any combination of the 3. Will insurance pay for my surgery?  Check with your insurance company to find out how much it pays for breast cancer surgery and other needed treatments. Will the type of surgery I have affect how long I live?  Women with early-stage breast cancer who have a mastectomy live as long as women who have breast-sparing surgery followed by radiation therapy. Most women with breast cancer will lead long, healthy lives after treatment. What are the chances that my cancer will come back after surgery?  About 5% of women who have a mastectomy will get cancer on the same side of their chest within 12 years. MASTECTOMY AND BREAST RECONSTRUCTION SURGERY  Is this surgery right for me?  If you have a mastectomy, you might also want breast reconstruction surgery.  You can choose to have reconstruction surgery at the same time as your mastectomy.  You can wait and have it at a later date. What are the names of the different kinds of surgery?  Breast implant.  Tissue flap surgery.  Reconstructive plastic surgery. What doctors am I likely to see?  Oncologist.  Surgeon.  Radiation Oncologist.  Reconstructive Plastic Surgeon.  Chemotherapy Oncologist. What will my breast look like after surgery?  Although you will have a breast-like shape, your   breast  will not look the same as it did before surgery. Will I have feeling in the area around my breast?  No. The area around your breast will always be numb (have no feeling). Will I have pain after the surgery?  You are likely to have pain after major surgery, such as mastectomy and reconstruction surgery. There are many ways to deal with pain. Let your caregiver know if you need relief from pain. What other problems can I expect?  It may take you many weeks or even months to recover from breast reconstruction surgery. If you have an implant, you may get infections, pain, or hardness. Also, you may not like how your breast-like shape looks. You may need more surgery if your implant breaks or leaks. If you have tissue flap surgery, you may lose strength in the part of your body where the flap came from. You may get swelling in your arm (lymphedema). Will I need more surgery?  Yes. You will need surgery at least 2 more times to build a new breast-like shape. With implants, you may need more surgery months or years later. You may also need surgery to remove lymph nodes from under your arm. What other types of treatment will I need?  You may need chemotherapy, hormone therapy, or radiation therapy. Some women get all 3 types of therapy or any combination of the 3. Will insurance pay for my surgery?  Check with your insurance company to find out if it pays for breast reconstruction surgery. You should also ask if your insurance will pay for problems that may result from breast reconstruction surgery. Will the type of surgery I have affect how long I live?  Women with early-stage breast cancer who have a mastectomy live the same amount of time as women who have breast-sparing surgery followed by radiation therapy. Most women with breast cancer will lead long, healthy lives after treatment. What are the chances that my cancer will come back after surgery?  About 5% of women who have a mastectomy will  get cancer on the same side of their chest within 12 years. Breast reconstruction surgery does not affect the chances of your cancer coming back. Where can I learn more about coping with life after cancer? To learn more about life after cancer, you might want to read "Facing Forward: Life After Cancer Treatment." You can get this booklet at www.cancer.gov/publications or by calling 1-800-4-CANCER. THINK ABOUT WHAT IS IMPORTANT TO YOU   After you have talked with your surgeon and learned the facts, you may also want to talk with your spouse or partner, family, friends, or other women who have had breast cancer surgery. The more you know about breast cancer, the treatment, and what happens afterward, the more informed you will be and the easier it will be for you to make good decisions that are important to you.  Think about what is important to you. Here are some questions to think about:  Do I want to get a second opinion?  How important is it to me how my breast looks after cancer surgery?  How important is it to me how my breast feels after cancer surgery?  If I have breast-sparing surgery or more extensive surgery, am I willing to get radiation, hormone and/or chemotherapy?  If I have a mastectomy, do I also want breast reconstruction surgery?  If I have breast reconstruction surgery, do I want it at the same time as my mastectomy?  What treatment   does my insurance cover, and what do I need to pay for?  Who would I like to talk with about my surgery, radiation, hormone, and chemotherapy choices?  What else do I want to know, do, or learn before I make my choice about breast cancer surgery?  After I have learned all I could and have talked with my surgeon, I will make a choice that feels right for me.  A patient who takes the time to be well-informed will feel more comfortable about her decisions regarding surgery, and will feel better about the procedure following the  surgery.  Coping and Support:  Be open and willing to talk to your family and friends about your cancer. Family and friends can be your best support group, to help you work through your concerns and worries.  Discussing your thoughts, concerns, and intimacy issues with your spouse or partner is very important and helpful.  Join support groups to share and learn how others with breast cancer cope and deal with their cancer. The [employer] can help you find support groups in your area.  Breast cancer may affect your confidence and feelings about being a total woman. It may interfere with your intimate relationship with your partner. Counseling may be necessary to help you overcome these feelings.  Talk to a counselor, your clergyperson, psychologist, or psychiatrist.  Talk to a medical social worker, if you have financial questions or problems.  Do not be afraid to ask for help, especially during your treatment.  Learn to be as independent as possible, as soon as possible. Document Released: 10/13/2003 Document Revised: 11/17/2012 Document Reviewed: 06/20/2009 [employer] Patient Information 2014 [employer].  

## 2013-07-23 ENCOUNTER — Encounter: Payer: Self-pay | Admitting: Specialist

## 2013-07-23 ENCOUNTER — Encounter: Payer: Self-pay | Admitting: *Deleted

## 2013-07-23 NOTE — Progress Notes (Signed)
Faxed Care Plan to [facility] & PCP.  Took to Med Rec to scan.

## 2013-07-23 NOTE — Progress Notes (Signed)
I met with the patient and her daughter in clinic.  Ms. Santalucia had a very positive attitude.  I told her about available support services.  I think she will attend support group, and she did request an Marketing executive.

## 2013-07-23 NOTE — Progress Notes (Signed)
Radiation Oncology         [phone] ________________________________  Name: Laurie Fox MRN: [id number]  Date: 07/22/2013  DOB: 01-Mar-1932  WU:[id number],NWGN Jomarie Longs, MD  Almond Lint, MD     REFERRING PHYSICIAN: Almond Lint, MD   DIAGNOSIS: The encounter diagnosis was Breast cancer of upper-inner quadrant of right female breast.   HISTORY OF PRESENT ILLNESS::Laurie Fox is a 79 y.o. female who is seen for an initial consultation visit. The patient is seen today in multidisciplinary breast clinic regarding her recent diagnosis of right-sided breast cancer. The patient was found to have some suspicious findings within the right breast on recent screening mammogram. Proceeded to undergo an ultrasound which revealed a 7 mm corresponding area. A biopsy was obtained and this returned positive for invasive mammary carcinoma favoring invasive ductal carcinoma. This appeared to represent a grade 1/2 tumor. Associated DCIS was present. Receptor studies have indicated that the tumor is ER positive, PR positive, HER-2/neu negative. The proliferative index was 30%.  The patient proceeded to undergo an MRI scan of the breasts bilaterally. This revealed a 1.3 cm area of post biopsy change within the upper inner quadrant of the right breast. No other suspicious findings within either breast and no abnormal appearing lymph nodes present.  I have been asked to see the patient today in multidisciplinary breast clinic to discuss possible adjuvant radiation treatment as part of her overall treatment plan.   PREVIOUS RADIATION THERAPY: No   PAST MEDICAL HISTORY:  has a past medical history of Diabetes mellitus without complication; Hypertension; and Arthritis.     PAST SURGICAL HISTORY: Past Surgical History  Procedure Laterality Date  . Knee surgery       FAMILY HISTORY: family history includes Alzheimer's disease in her mother; Colon cancer in her father.   SOCIAL HISTORY:  reports  that she has never smoked. She does not have any smokeless tobacco history on file. She reports that she does not drink alcohol or use illicit drugs.   ALLERGIES: Codeine and Sulfa antibiotics   MEDICATIONS:  Current Outpatient Prescriptions  Medication Sig Dispense Refill  . acetaminophen (TYLENOL) 325 MG tablet Take 650 mg by mouth every 6 (six) hours as needed.      Marland Kitchen amLODipine (NORVASC) 10 MG tablet Take 10 mg by mouth daily.      Marland Kitchen aspirin 81 MG tablet Take 81 mg by mouth daily.      . carboxymethylcellulose (REFRESH PLUS) 0.5 % SOLN 1 drop 3 (three) times daily as needed.      . carvedilol (COREG) 25 MG tablet Take 25 mg by mouth 2 (two) times daily with a meal.      . cycloSPORINE (RESTASIS) 0.05 % ophthalmic emulsion 1 drop 2 (two) times daily.      Marland Kitchen MELATONIN PO Take 2.5 mg by mouth. 1/2 tablet once a day      . metFORMIN (GLUCOPHAGE) 500 MG tablet Take 500 mg by mouth 2 (two) times daily with a meal. 3 tablets in am, 2 tablets in evening      . Multiple Vitamin (MULTI-VITAMIN DAILY PO) Take by mouth.      Marland Kitchen omeprazole (PRILOSEC) 20 MG capsule Take 20 mg by mouth daily.      . simvastatin (ZOCOR) 20 MG tablet Take 20 mg by mouth daily.      Marland Kitchen telmisartan-hydrochlorothiazide (MICARDIS HCT) 40-12.5 MG per tablet Take 1 tablet by mouth daily.      . vitamin B-12 (CYANOCOBALAMIN)  100 MCG tablet Take 100 mcg by mouth daily.      Marland Kitchen VITAMIN D, ERGOCALCIFEROL, PO Take by mouth.       No current facility-administered medications for this encounter.     REVIEW OF SYSTEMS:  A 15 point review of systems is documented in the electronic medical record. This was obtained by the nursing staff. However, I reviewed this with the patient to discuss relevant findings and make appropriate changes.  Pertinent items are noted in HPI.    PHYSICAL EXAM:  vitals were not taken for this visit.  ECOG = 1  0 - Asymptomatic (Fully active, able to carry on all predisease activities without  restriction)  1 - Symptomatic but completely ambulatory (Restricted in physically strenuous activity but ambulatory and able to carry out work of a light or sedentary nature. For example, light housework, office work)  2 - Symptomatic, <50% in bed during the day (Ambulatory and capable of all self care but unable to carry out any work activities. Up and about more than 50% of waking hours)  3 - Symptomatic, >50% in bed, but not bedbound (Capable of only limited self-care, confined to bed or chair 50% or more of waking hours)  4 - Bedbound (Completely disabled. Cannot carry on any self-care. Totally confined to bed or chair)  5 - Death   Santiago Glad MM, Creech RH, Tormey DC, et al. [phone]). "Toxicity and response criteria of the [facility]". Am. Evlyn Clines. Oncol. 5 (6): 649-55  General: Well-developed, in no acute distress HEENT: Normocephalic, atraumatic; oral cavity clear Neck: Supple without any lymphadenopathy Cardiovascular: Regular rate and rhythm Respiratory: Clear to auscultation bilaterally Breasts: Post biopsy change within the right breast. Otherwise no palpable masses in either breast and no axillary adenopathy present GI: Soft, nontender, normal bowel sounds Extremities: No edema present Neuro: No focal deficits     LABORATORY DATA:  Lab Results  Component Value Date   WBC 7.6 07/22/2013   HGB 12.1 07/22/2013   HCT 36.6 07/22/2013   MCV 84.1 07/22/2013   PLT 220 07/22/2013   Lab Results  Component Value Date   NA 143 07/22/2013   K 3.7 07/22/2013   CO2 30* 07/22/2013   Lab Results  Component Value Date   ALT 12 07/22/2013   AST 15 07/22/2013   ALKPHOS 72 07/22/2013   BILITOT 0.42 07/22/2013      RADIOGRAPHY: US Breast Right  07/13/2013   ADDENDUM REPORT: 07/13/2013 13:19  ADDENDUM: No sonographically identified right axillary lymphadenopathy.   Electronically Signed   By: Annia Belt M.D.   On: 07/13/2013 13:19   07/13/2013   CLINICAL  DATA:  Patient recalled from screening for right breast mass.  EXAM: DIGITAL DIAGNOSTIC  RIGHT MAMMOGRAM WITH CAD  ULTRASOUND RIGHT BREAST  COMPARISON:  [facility]  ACR Breast Density Category b: There are scattered areas of fibroglandular density.  FINDINGS: Spot compression views of the right breast 1 o'clock position middle to posterior depth demonstrates a persistent 1 cm irregular mass.  Mammographic images were processed with CAD.  On physical exam,I palpate no discrete mass within the 1 o'clock position middle to posterior depth right breast.  Ultrasound is performed, showing a 7 x 7 x 5 mm irregular hypoechoic mass within the right breast 1 o'clock position 5 cm from the nipple.  IMPRESSION: Suspicious right breast mass. Ultrasound-guided core needle biopsy is recommended.  RECOMMENDATION: Ultrasound-guided core needle biopsy right breast mass. This is scheduled for July 15, 2013.  I have discussed the findings and recommendations with the patient. Results were also provided in writing at the conclusion of the visit.  BI-RADS CATEGORY  4: Suspicious abnormality - biopsy should be considered.  Electronically Signed: By: Annia Belt M.D. On: 07/13/2013 13:14   Mr Breast Bilateral W Wo Contrast  07/21/2013   CLINICAL DATA:  Recently diagnosed right breast invasive mammary carcinoma and mammary carcinoma in situ.  EXAM: BILATERAL BREAST MRI WITH AND WITHOUT CONTRAST  LABS:  None obtained today.  TECHNIQUE: Multiplanar, multisequence MR images of both breasts were obtained prior to and following the intravenous administration of 20ml of MultiHance.  THREE-DIMENSIONAL MR IMAGE RENDERING ON INDEPENDENT WORKSTATION:  Three-dimensional MR images were rendered by post-processing of the original MR data on an independent workstation. The three-dimensional MR images were interpreted, and findings are reported in the following complete MRI report for this study. Three dimensional images were evaluated at the  independent DynaCad workstation  COMPARISON:  Previous exams  FINDINGS: Breast composition: b. Scattered fibroglandular tissue  Background parenchymal enhancement: Mild  Right breast: Biopsy marker clip artifact and biopsy tract in the upper inner right breast with a small amount of surrounding enhancement measuring 1.3 x 0.9 x 0.9 cm.  Left breast: No mass or abnormal enhancement.  Lymph nodes: No abnormal appearing lymph nodes.  Ancillary findings:  None.  IMPRESSION: 1.3 x 0.9 x 0.9 cm area of post biopsy change and possible minimal residual malignancy in the upper inner right breast. Otherwise, unremarkable examination.  RECOMMENDATION: Treatment plan.  BI-RADS CATEGORY  6: Known biopsy-proven malignancy - appropriate action should be taken.   Electronically Signed   By: Gordan Payment M.D.   On: 07/21/2013 10:08   Mm Digital Diag Ltd R  07/13/2013   ADDENDUM REPORT: 07/13/2013 13:19  ADDENDUM: No sonographically identified right axillary lymphadenopathy.   Electronically Signed   By: Annia Belt M.D.   On: 07/13/2013 13:19   07/13/2013   CLINICAL DATA:  Patient recalled from screening for right breast mass.  EXAM: DIGITAL DIAGNOSTIC  RIGHT MAMMOGRAM WITH CAD  ULTRASOUND RIGHT BREAST  COMPARISON:  [facility]  ACR Breast Density Category b: There are scattered areas of fibroglandular density.  FINDINGS: Spot compression views of the right breast 1 o'clock position middle to posterior depth demonstrates a persistent 1 cm irregular mass.  Mammographic images were processed with CAD.  On physical exam,I palpate no discrete mass within the 1 o'clock position middle to posterior depth right breast.  Ultrasound is performed, showing a 7 x 7 x 5 mm irregular hypoechoic mass within the right breast 1 o'clock position 5 cm from the nipple.  IMPRESSION: Suspicious right breast mass. Ultrasound-guided core needle biopsy is recommended.  RECOMMENDATION: Ultrasound-guided core needle biopsy right breast mass. This is scheduled  for July 15, 2013.  I have discussed the findings and recommendations with the patient. Results were also provided in writing at the conclusion of the visit.  BI-RADS CATEGORY  4: Suspicious abnormality - biopsy should be considered.  Electronically Signed: By: Annia Belt M.D. On: 07/13/2013 13:14   Mm Digital Diagnostic Unilat R  07/15/2013   CLINICAL DATA:  Status post ultrasound-guided core needle biopsy of a 7 mm 1 o'clock right breast mass.  EXAM: DIAGNOSTIC RIGHT MAMMOGRAM POST ULTRASOUND BIOPSY  COMPARISON:  Previous exams  FINDINGS: Mammographic images were obtained following ultrasound guided biopsy of a 7 mm mass in the 1 o'clock position of the right breast. These  demonstrate a bow tie shaped biopsy marker clip at the location of the biopsied mass.  IMPRESSION: Appropriate clip deployment following right breast ultrasound-guided core needle biopsy.  Final Assessment: Post Procedure Mammograms for Marker Placement   Electronically Signed   By: Gordan Payment M.D.   On: 07/15/2013 15:48   Mm Digital Screening  07/06/2013   CLINICAL DATA:  Screening.  EXAM: DIGITAL SCREENING BILATERAL MAMMOGRAM WITH CAD  COMPARISON:  Previous exam(s)  ACR Breast Density Category b: There are scattered areas of fibroglandular density.  FINDINGS: In the right breast, a possible mass warrants further evaluation with spot compression views and possibly ultrasound. In the left breast, no masses or malignant type calcifications are identified. Images were processed with CAD.  IMPRESSION: Further evaluation is suggested for possible mass in the right breast.  RECOMMENDATION: Diagnostic mammogram and possibly ultrasound of the right breast. (Code:[id number])  The patient will be contacted regarding the findings, and additional imaging will be scheduled.  BI-RADS CATEGORY  0: Incomplete. Need additional imaging evaluation and/or prior mammograms for comparison.   Electronically Signed   By: Amie Portland M.D.   On: 07/06/2013  16:55   Mm Radiologist Eval And Mgmt  07/16/2013   EXAM: ESTABLISHED PATIENT OFFICE VISIT - LEVEL II ([id number])  EXAM: There is firmness to palpation in the upper inner right breast superior to the biopsy site, measuring approximately 4 cm in diameter. Minimal bruising. No significant tenderness to palpation.  HISTORY OF PRESENT ILLNESS: 7 mm mass in the 1 o'clock position of the right breast at recent mammography and ultrasound, biopsied under ultrasound guidance yesterday.  CHIEF COMPLAINT: Status post ultrasound-guided core needle biopsy of a 7 mm mass in the 1 o'clock position of the right breast. No pain reported at the biopsy site.  PATHOLOGY: Invasive mammary carcinoma and mammary carcinoma in situ.  ASSESSMENT AND PLAN: ASSESSMENT AND PLAN The pathological diagnosis is concordant with the imaging findings. The patient was given an appointment for a bilateral breast MRI with and without contrast at [facility] at [address] at 10:15 a.m. on 07/20/2013. She will be contacted by the Multidisciplinary Clinic for an appointment.   Electronically Signed   By: Gordan Payment M.D.   On: 07/16/2013 15:29   [place] Rt Breast Bx W Loc Dev 1st Lesion Img Bx Spec US Guide  07/15/2013   CLINICAL DATA:  7 mm mass in the 1 o'clock position of the right breast with imaging features suspicious for malignancy.  EXAM: ULTRASOUND GUIDED RIGHT BREAST CORE NEEDLE BIOPSY WITH VACUUM ASSIST  COMPARISON:  Previous exams.  PROCEDURE: I met with the patient and we discussed the procedure of ultrasound-guided biopsy, including benefits and alternatives. We discussed the high likelihood of a successful procedure. We discussed the risks of the procedure including infection, bleeding, tissue injury, clip migration, and inadequate sampling. Informed written consent was given. The usual time-out protocol was performed immediately prior to the procedure.  Using sterile technique and 2% Lidocaine as local anesthetic, under  direct ultrasound visualization, a 12 gauge vacuum-assisteddevice was used to perform biopsy of the recently demonstrated 7 mm mass in the 1 o'clock position of the right breast using an inferomedial approach. At the conclusion of the procedure, a bow tie tissue marker clip was deployed into the biopsy cavity. Follow-up 2-view mammogram was performed and dictated separately.  IMPRESSION: Ultrasound-guided biopsy of a 7 mm 1 o'clock right breast mass. No apparent complications.   Electronically Signed   By:  Gordan Payment M.D.   On: 07/15/2013 15:19       IMPRESSION: The patient has a recent diagnosis of invasive mammary carcinoma of the right breast. Clinically this represents a T1, N0, M0 tumor. The patient appears to be a good candidate for breast conservation treatment. She indicates an interest in this overall treatment plan today.   I discussed with the patient the possible role of adjuvant radiotherapy in the setting of breast cancer. The patient is 16 and if this remains a T1 N0 tumor with negative margins after lumpectomy then I believe that her outcome with the quite favorable in the absence of radiation treatment. However, the patient is very active and she appears very interested in aggressive treatment. There is a local control benefit in her setting and I for the comfortable proceeding with radiation treatment as well if this is her preference.  We discussed the benefit of radiation treatment therefore as well as the possible side effects and risks of treatment as well. All of her questions were answered.   PLAN: The patient will be seen postoperatively to review how she is doing at that time and to make a final decision regarding possible adjuvant radiation treatment.    I spent 50 minutes face to face with the patient and more than 50% of that time was spent in counseling and/or coordination of care.    ________________________________   Radene Gunning, MD, PhD

## 2013-07-24 ENCOUNTER — Encounter: Payer: Self-pay | Admitting: *Deleted

## 2013-07-24 NOTE — Progress Notes (Signed)
Mailed after appt letter to pt. 

## 2013-07-27 ENCOUNTER — Telehealth: Payer: Self-pay | Admitting: *Deleted

## 2013-07-27 NOTE — Telephone Encounter (Signed)
Called and spoke with patient from [facility] 07/22/13.  No questions or concerns at this time.  Confirmed appt with Dr. Welton Flakes 08/26/13 at 0830.

## 2013-07-31 ENCOUNTER — Telehealth (INDEPENDENT_AMBULATORY_CARE_PROVIDER_SITE_OTHER): Payer: Self-pay

## 2013-07-31 NOTE — Telephone Encounter (Signed)
Pt notified of post op appointment with Dr. Donell Beers on 08/25/13 at 4 o'clock.

## 2013-08-04 ENCOUNTER — Encounter (HOSPITAL_BASED_OUTPATIENT_CLINIC_OR_DEPARTMENT_OTHER): Payer: Self-pay | Admitting: *Deleted

## 2013-08-04 NOTE — Progress Notes (Signed)
Pt vwery active and self sufficient for age-will come in for ekg-had labs done [facility] 07/22/13-no cardiac or resp problems

## 2013-08-05 ENCOUNTER — Encounter (HOSPITAL_BASED_OUTPATIENT_CLINIC_OR_DEPARTMENT_OTHER)
Admission: RE | Admit: 2013-08-05 | Discharge: 2013-08-05 | Disposition: A | Discharge status: Home / Self Care | Payer: Medicare Other | Source: Ambulatory Visit | Attending: General Surgery | Admitting: General Surgery

## 2013-08-05 DIAGNOSIS — Z01810 Encounter for preprocedural cardiovascular examination: Secondary | ICD-10-CM | POA: Insufficient documentation

## 2013-08-05 DIAGNOSIS — Z01818 Encounter for other preprocedural examination: Secondary | ICD-10-CM | POA: Insufficient documentation

## 2013-08-11 ENCOUNTER — Encounter (HOSPITAL_BASED_OUTPATIENT_CLINIC_OR_DEPARTMENT_OTHER)
Admission: RE | Disposition: A | Discharge status: Home / Self Care | Payer: Self-pay | Source: Ambulatory Visit | Attending: General Surgery

## 2013-08-11 ENCOUNTER — Ambulatory Visit
Admission: RE | Admit: 2013-08-11 | Discharge: 2013-08-11 | Disposition: A | Discharge status: Home / Self Care | Payer: Medicare Other | Source: Ambulatory Visit | Attending: General Surgery | Admitting: General Surgery

## 2013-08-11 ENCOUNTER — Encounter (HOSPITAL_BASED_OUTPATIENT_CLINIC_OR_DEPARTMENT_OTHER): Payer: Medicare Other | Admitting: Anesthesiology

## 2013-08-11 ENCOUNTER — Encounter (HOSPITAL_BASED_OUTPATIENT_CLINIC_OR_DEPARTMENT_OTHER): Payer: Self-pay | Admitting: *Deleted

## 2013-08-11 ENCOUNTER — Ambulatory Visit (HOSPITAL_BASED_OUTPATIENT_CLINIC_OR_DEPARTMENT_OTHER): Payer: Medicare Other | Admitting: Anesthesiology

## 2013-08-11 ENCOUNTER — Encounter (HOSPITAL_COMMUNITY)
Admission: RE | Admit: 2013-08-11 | Discharge: 2013-08-11 | Disposition: A | Discharge status: Home / Self Care | Payer: Medicare Other | Source: Ambulatory Visit | Attending: General Surgery | Admitting: General Surgery

## 2013-08-11 ENCOUNTER — Other Ambulatory Visit (INDEPENDENT_AMBULATORY_CARE_PROVIDER_SITE_OTHER): Payer: Self-pay | Admitting: General Surgery

## 2013-08-11 ENCOUNTER — Ambulatory Visit (HOSPITAL_BASED_OUTPATIENT_CLINIC_OR_DEPARTMENT_OTHER)
Admission: RE | Admit: 2013-08-11 | Discharge: 2013-08-11 | Disposition: A | Discharge status: Home / Self Care | Payer: Medicare Other | Source: Ambulatory Visit | Attending: General Surgery | Admitting: General Surgery

## 2013-08-11 DIAGNOSIS — C50211 Malignant neoplasm of upper-inner quadrant of right female breast: Secondary | ICD-10-CM

## 2013-08-11 DIAGNOSIS — E119 Type 2 diabetes mellitus without complications: Secondary | ICD-10-CM | POA: Insufficient documentation

## 2013-08-11 DIAGNOSIS — C50911 Malignant neoplasm of unspecified site of right female breast: Secondary | ICD-10-CM

## 2013-08-11 DIAGNOSIS — D4860 Neoplasm of uncertain behavior of unspecified breast: Secondary | ICD-10-CM

## 2013-08-11 DIAGNOSIS — C50919 Malignant neoplasm of unspecified site of unspecified female breast: Secondary | ICD-10-CM | POA: Insufficient documentation

## 2013-08-11 DIAGNOSIS — I10 Essential (primary) hypertension: Secondary | ICD-10-CM | POA: Insufficient documentation

## 2013-08-11 DIAGNOSIS — K219 Gastro-esophageal reflux disease without esophagitis: Secondary | ICD-10-CM | POA: Insufficient documentation

## 2013-08-11 HISTORY — PX: BREAST LUMPECTOMY WITH NEEDLE LOCALIZATION AND AXILLARY SENTINEL LYMPH NODE BX: SHX5760

## 2013-08-11 HISTORY — DX: Presence of spectacles and contact lenses: Z97.3

## 2013-08-11 HISTORY — DX: Gastro-esophageal reflux disease without esophagitis: K21.9

## 2013-08-11 LAB — GLUCOSE, CAPILLARY
GLUCOSE-CAPILLARY: 143 mg/dL — AB (ref 70–99)
Glucose-Capillary: 132 mg/dL — ABNORMAL HIGH (ref 70–99)

## 2013-08-11 SURGERY — BREAST LUMPECTOMY WITH NEEDLE LOCALIZATION AND AXILLARY SENTINEL LYMPH NODE BX
Anesthesia: General | Site: Breast | Laterality: Right

## 2013-08-11 MED ORDER — SODIUM CHLORIDE 0.9 % IJ SOLN
INTRAMUSCULAR | Status: DC | PRN
  Administered 2013-08-11: 12:00:00

## 2013-08-11 MED ORDER — DEXAMETHASONE SODIUM PHOSPHATE 4 MG/ML IJ SOLN
INTRAMUSCULAR | Status: DC | PRN
  Administered 2013-08-11: 4 mg via INTRAVENOUS

## 2013-08-11 MED ORDER — SODIUM CHLORIDE 0.9 % IJ SOLN: 3.0000 mL | Freq: Two times a day (BID) | INTRAMUSCULAR | Status: DC

## 2013-08-11 MED ORDER — OXYCODONE HCL 5 MG PO TABS
5.0000 mg | ORAL_TABLET | ORAL | Status: DC | PRN
  Administered 2013-08-11: 5 mg via ORAL

## 2013-08-11 MED ORDER — BUPIVACAINE HCL (PF) 0.25 % IJ SOLN
INTRAMUSCULAR | Status: DC | PRN
  Administered 2013-08-11: 10 mL

## 2013-08-11 MED ORDER — TECHNETIUM TC 99M SULFUR COLLOID FILTERED
1.0000 | Freq: Once | INTRAVENOUS | Status: AC | PRN
  Administered 2013-08-11: 1 via INTRADERMAL

## 2013-08-11 MED ORDER — METHYLENE BLUE 1 % INJ SOLN
INTRAMUSCULAR | Status: AC
  Filled 2013-08-11: qty 10

## 2013-08-11 MED ORDER — ONDANSETRON HCL 4 MG/2ML IJ SOLN
4.0000 mg | Freq: Four times a day (QID) | INTRAMUSCULAR | Status: DC | PRN
Start: 2013-08-11 — End: 2013-08-11

## 2013-08-11 MED ORDER — OXYCODONE HCL 5 MG PO TABS
ORAL_TABLET | ORAL | Status: AC
  Filled 2013-08-11: qty 1

## 2013-08-11 MED ORDER — CHLORHEXIDINE GLUCONATE 4 % EX LIQD: 1.0000 "application " | Freq: Once | CUTANEOUS | Status: DC

## 2013-08-11 MED ORDER — ONDANSETRON HCL 4 MG/2ML IJ SOLN
INTRAMUSCULAR | Status: DC | PRN
  Administered 2013-08-11: 4 mg via INTRAVENOUS

## 2013-08-11 MED ORDER — BUPIVACAINE-EPINEPHRINE PF 0.5-1:200000 % IJ SOLN
INTRAMUSCULAR | Status: AC
  Filled 2013-08-11: qty 30

## 2013-08-11 MED ORDER — SODIUM CHLORIDE 0.9 % IV SOLN: 250.0000 mL | INTRAVENOUS | Status: DC | PRN

## 2013-08-11 MED ORDER — HYDROMORPHONE HCL PF 1 MG/ML IJ SOLN
0.2500 mg | INTRAMUSCULAR | Status: DC | PRN
  Administered 2013-08-11: 0.25 mg via INTRAVENOUS
  Administered 2013-08-11: 0.5 mg via INTRAVENOUS

## 2013-08-11 MED ORDER — FENTANYL CITRATE 0.05 MG/ML IJ SOLN
INTRAMUSCULAR | Status: AC
  Filled 2013-08-11: qty 4

## 2013-08-11 MED ORDER — LIDOCAINE HCL (CARDIAC) 20 MG/ML IV SOLN
INTRAVENOUS | Status: DC | PRN
  Administered 2013-08-11: 100 mg via INTRAVENOUS

## 2013-08-11 MED ORDER — ACETAMINOPHEN 325 MG PO TABS: 650.0000 mg | ORAL_TABLET | ORAL | Status: DC | PRN

## 2013-08-11 MED ORDER — BUPIVACAINE HCL (PF) 0.25 % IJ SOLN
INTRAMUSCULAR | Status: AC
  Filled 2013-08-11: qty 30

## 2013-08-11 MED ORDER — ACETAMINOPHEN 650 MG RE SUPP: 650.0000 mg | RECTAL | Status: DC | PRN

## 2013-08-11 MED ORDER — FENTANYL CITRATE 0.05 MG/ML IJ SOLN
INTRAMUSCULAR | Status: DC | PRN
  Administered 2013-08-11: 25 ug via INTRAVENOUS
  Administered 2013-08-11: 100 ug via INTRAVENOUS

## 2013-08-11 MED ORDER — MIDAZOLAM HCL 2 MG/2ML IJ SOLN
INTRAMUSCULAR | Status: AC
  Filled 2013-08-11: qty 2

## 2013-08-11 MED ORDER — LACTATED RINGERS IV SOLN
INTRAVENOUS | Status: DC
  Administered 2013-08-11 (×2): via INTRAVENOUS

## 2013-08-11 MED ORDER — SODIUM CHLORIDE 0.9 % IJ SOLN: 3.0000 mL | INTRAMUSCULAR | Status: DC | PRN

## 2013-08-11 MED ORDER — PROMETHAZINE HCL 25 MG/ML IJ SOLN: 6.2500 mg | INTRAMUSCULAR | Status: DC | PRN

## 2013-08-11 MED ORDER — MIDAZOLAM HCL 2 MG/2ML IJ SOLN
1.0000 mg | INTRAMUSCULAR | Status: DC | PRN
  Administered 2013-08-11: 1 mg via INTRAVENOUS

## 2013-08-11 MED ORDER — OXYCODONE-ACETAMINOPHEN 5-325 MG PO TABS: 1.0000 | ORAL_TABLET | ORAL | Status: DC | PRN

## 2013-08-11 MED ORDER — CEFAZOLIN SODIUM-DEXTROSE 2-3 GM-% IV SOLR
2.0000 g | INTRAVENOUS | Status: AC
  Administered 2013-08-11: 2 g via INTRAVENOUS

## 2013-08-11 MED ORDER — BUPIVACAINE-EPINEPHRINE 0.5% -1:200000 IJ SOLN
INTRAMUSCULAR | Status: DC | PRN
  Administered 2013-08-11: 22 mL

## 2013-08-11 MED ORDER — FENTANYL CITRATE 0.05 MG/ML IJ SOLN
50.0000 ug | INTRAMUSCULAR | Status: DC | PRN
  Administered 2013-08-11: 50 ug via INTRAVENOUS

## 2013-08-11 MED ORDER — SODIUM CHLORIDE 0.9 % IJ SOLN
INTRAMUSCULAR | Status: AC
  Filled 2013-08-11: qty 10

## 2013-08-11 MED ORDER — HYDROMORPHONE HCL PF 1 MG/ML IJ SOLN
INTRAMUSCULAR | Status: AC
  Filled 2013-08-11: qty 1

## 2013-08-11 MED ORDER — PROPOFOL 10 MG/ML IV BOLUS
INTRAVENOUS | Status: DC | PRN
  Administered 2013-08-11: 150 mg via INTRAVENOUS

## 2013-08-11 MED ORDER — FENTANYL CITRATE 0.05 MG/ML IJ SOLN
INTRAMUSCULAR | Status: AC
  Filled 2013-08-11: qty 2

## 2013-08-11 MED ORDER — CEFAZOLIN SODIUM 1-5 GM-% IV SOLN
INTRAVENOUS | Status: AC
  Filled 2013-08-11: qty 100

## 2013-08-11 SURGICAL SUPPLY — 70 items
BINDER BREAST LRG (GAUZE/BANDAGES/DRESSINGS) IMPLANT
BINDER BREAST MEDIUM (GAUZE/BANDAGES/DRESSINGS) IMPLANT
BINDER BREAST XLRG (GAUZE/BANDAGES/DRESSINGS) ×2 IMPLANT
BINDER BREAST XXLRG (GAUZE/BANDAGES/DRESSINGS) IMPLANT
BLADE HEX COATED 2.75 (ELECTRODE) ×3 IMPLANT
BLADE SURG 10 STRL SS (BLADE) ×3 IMPLANT
BLADE SURG 15 STRL LF DISP TIS (BLADE) ×1 IMPLANT
BLADE SURG 15 STRL SS (BLADE) ×3
BNDG COHESIVE 4X5 TAN STRL (GAUZE/BANDAGES/DRESSINGS) ×3 IMPLANT
CANISTER SUCT 1200ML W/VALVE (MISCELLANEOUS) ×3 IMPLANT
CHLORAPREP W/TINT 26ML (MISCELLANEOUS) ×3 IMPLANT
CLIP TI LARGE 6 (CLIP) ×3 IMPLANT
CLIP TI MEDIUM 6 (CLIP) ×9 IMPLANT
CLIP TI WIDE RED SMALL 6 (CLIP) IMPLANT
CLOSURE WOUND 1/2 X4 (GAUZE/BANDAGES/DRESSINGS) ×1
COVER MAYO STAND STRL (DRAPES) ×3 IMPLANT
COVER PROBE W GEL 5X96 (DRAPES) ×3 IMPLANT
DECANTER SPIKE VIAL GLASS SM (MISCELLANEOUS) IMPLANT
DEVICE DUBIN W/COMP PLATE 8390 (MISCELLANEOUS) ×2 IMPLANT
DRAIN CHANNEL 19F RND (DRAIN) IMPLANT
DRAPE UTILITY XL STRL (DRAPES) ×3 IMPLANT
DRSG TEGADERM 4X4.75 (GAUZE/BANDAGES/DRESSINGS) IMPLANT
ELECT REM PT RETURN 9FT ADLT (ELECTROSURGICAL) ×3
ELECTRODE REM PT RTRN 9FT ADLT (ELECTROSURGICAL) ×1 IMPLANT
EVACUATOR SILICONE 100CC (DRAIN) IMPLANT
GLOVE BIO SURGEON STRL SZ 6 (GLOVE) ×3 IMPLANT
GLOVE BIOGEL PI IND STRL 6.5 (GLOVE) ×1 IMPLANT
GLOVE BIOGEL PI IND STRL 7.0 (GLOVE) IMPLANT
GLOVE BIOGEL PI IND STRL 7.5 (GLOVE) ×4 IMPLANT
GLOVE BIOGEL PI INDICATOR 6.5 (GLOVE) ×2
GLOVE BIOGEL PI INDICATOR 7.0 (GLOVE) ×2
GLOVE BIOGEL PI INDICATOR 7.5 (GLOVE) ×8
GLOVE ECLIPSE 6.5 STRL STRAW (GLOVE) ×2 IMPLANT
GLOVE ECLIPSE 7.0 STRL STRAW (GLOVE) ×12 IMPLANT
GOWN STRL REUS W/ TWL LRG LVL3 (GOWN DISPOSABLE) ×1 IMPLANT
GOWN STRL REUS W/ TWL XL LVL3 (GOWN DISPOSABLE) IMPLANT
GOWN STRL REUS W/TWL 2XL LVL3 (GOWN DISPOSABLE) ×9 IMPLANT
GOWN STRL REUS W/TWL LRG LVL3 (GOWN DISPOSABLE) ×3
GOWN STRL REUS W/TWL XL LVL3 (GOWN DISPOSABLE) ×6
KIT MARKER MARGIN INK (KITS) ×3 IMPLANT
NDL HYPO 25X1 1.5 SAFETY (NEEDLE) ×2 IMPLANT
NDL SAFETY ECLIPSE 18X1.5 (NEEDLE) ×1 IMPLANT
NEEDLE HYPO 18GX1.5 SHARP (NEEDLE) ×3
NEEDLE HYPO 25X1 1.5 SAFETY (NEEDLE) ×6 IMPLANT
NS IRRIG 1000ML POUR BTL (IV SOLUTION) ×3 IMPLANT
PACK BASIN DAY SURGERY FS (CUSTOM PROCEDURE TRAY) ×3 IMPLANT
PACK UNIVERSAL I (CUSTOM PROCEDURE TRAY) ×3 IMPLANT
PAD ABD 8X10 STRL (GAUZE/BANDAGES/DRESSINGS) ×2 IMPLANT
PENCIL BUTTON HOLSTER BLD 10FT (ELECTRODE) ×3 IMPLANT
PIN SAFETY STERILE (MISCELLANEOUS) IMPLANT
SLEEVE SCD COMPRESS KNEE MED (MISCELLANEOUS) ×2 IMPLANT
SPONGE GAUZE 4X4 12PLY (GAUZE/BANDAGES/DRESSINGS) ×2 IMPLANT
SPONGE LAP 18X18 X RAY DECT (DISPOSABLE) ×3 IMPLANT
SPONGE LAP 4X18 X RAY DECT (DISPOSABLE) IMPLANT
STAPLER VISISTAT 35W (STAPLE) ×3 IMPLANT
STOCKINETTE IMPERVIOUS LG (DRAPES) ×3 IMPLANT
STRIP CLOSURE SKIN 1/2X4 (GAUZE/BANDAGES/DRESSINGS) ×2 IMPLANT
SUT MON AB 4-0 PC3 18 (SUTURE) ×3 IMPLANT
SUT SILK 2 0 SH (SUTURE) IMPLANT
SUT VIC AB 2-0 SH 27 (SUTURE)
SUT VIC AB 2-0 SH 27XBRD (SUTURE) IMPLANT
SUT VIC AB 3-0 SH 27 (SUTURE) ×6
SUT VIC AB 3-0 SH 27X BRD (SUTURE) ×2 IMPLANT
SYR BULB 3OZ (MISCELLANEOUS) IMPLANT
SYR CONTROL 10ML LL (SYRINGE) ×6 IMPLANT
TOWEL OR 17X24 6PK STRL BLUE (TOWEL DISPOSABLE) ×3 IMPLANT
TOWEL OR NON WOVEN STRL DISP B (DISPOSABLE) ×3 IMPLANT
TUBE CONNECTING 20'X1/4 (TUBING) ×1
TUBE CONNECTING 20X1/4 (TUBING) ×2 IMPLANT
YANKAUER SUCT BULB TIP NO VENT (SUCTIONS) ×3 IMPLANT

## 2013-08-11 NOTE — Interval H&P Note (Signed)
History and Physical Interval Note:  08/11/2013 11:09 AM  Laurie Fox  has presented today for surgery, with the diagnosis of right breast cancer  The various methods of treatment have been discussed with the patient and family. After consideration of risks, benefits and other options for treatment, the patient has consented to  Procedure(s): RIGHT BREAST LUMPECTOMY WITH NEEDLE LOCALIZATION AND AXILLARY SENTINEL LYMPH NODE BX (Right) as a surgical intervention .  The patient's history has been reviewed, patient examined, no change in status, stable for surgery.  I have reviewed the patient's chart and labs.  Questions were answered to the patient's satisfaction.     Corrin Hingle

## 2013-08-11 NOTE — H&P (View-Only) (Signed)
Chief complaint:  Right breast cancer    HISTORY: Patient is a 80 year old female with a new diagnosis of right breast cancer. She presented with screening detected right breast mass at 1:00. This was 7 mm on mammography and ultrasound. On MRI it measured 1.3 cm and the MRI was negative for other concerning lesions.  The cancer was ER/PR positive Ki-67 was 30%. HER-2 was not overexpressed. She did have invasive mammary and in situ carcinoma.  Her father had colon cancer, but she has no other family cancer history.  Menarche was at age 10.  She had menopause at age 83.  She had 2 children, with the first at age 34.  She did use OCPs for 6 years.  She has not used HRT.  She is up to date with bone density and colonoscopy.    Past Medical History  Diagnosis Date  . Diabetes mellitus without complication   . Hypertension   . Arthritis     Past Surgical History  Procedure Laterality Date  . Knee surgery      Current Outpatient Prescriptions  Medication Sig Dispense Refill  . acetaminophen (TYLENOL) 325 MG tablet Take 650 mg by mouth every 6 (six) hours as needed.      Marland Kitchen amLODipine (NORVASC) 10 MG tablet Take 10 mg by mouth daily.      Marland Kitchen aspirin 81 MG tablet Take 81 mg by mouth daily.      . carboxymethylcellulose (REFRESH PLUS) 0.5 % SOLN 1 drop 3 (three) times daily as needed.      . carvedilol (COREG) 25 MG tablet Take 25 mg by mouth 2 (two) times daily with a meal.      . cycloSPORINE (RESTASIS) 0.05 % ophthalmic emulsion 1 drop 2 (two) times daily.      Marland Kitchen MELATONIN PO Take 2.5 mg by mouth. 1/2 tablet once a day      . metFORMIN (GLUCOPHAGE) 500 MG tablet Take 500 mg by mouth 2 (two) times daily with a meal. 3 tablets in am, 2 tablets in evening      . Multiple Vitamin (MULTI-VITAMIN DAILY PO) Take by mouth.      Marland Kitchen omeprazole (PRILOSEC) 20 MG capsule Take 20 mg by mouth daily.      . simvastatin (ZOCOR) 20 MG tablet Take 20 mg by mouth daily.      Marland Kitchen telmisartan-hydrochlorothiazide  (MICARDIS HCT) 40-12.5 MG per tablet Take 1 tablet by mouth daily.      . vitamin B-12 (CYANOCOBALAMIN) 100 MCG tablet Take 100 mcg by mouth daily.      Marland Kitchen VITAMIN D, ERGOCALCIFEROL, PO Take by mouth.       No current facility-administered medications for this visit.     Allergies  Allergen Reactions  . Codeine   . Sulfa Antibiotics      Family History  Problem Relation Age of Onset  . Alzheimer's disease Mother   . Colon cancer Father      History   Social History  . Marital Status: Widowed    Spouse Name: N/A    Number of Children: N/A  . Years of Education: N/A   Social History Main Topics  . Smoking status: Never Smoker   . Smokeless tobacco: Not on file  . Alcohol Use: No  . Drug Use: No  . Sexual Activity: Not Currently   Other Topics Concern  . Not on file   Social History Narrative  . No narrative on file  REVIEW OF SYSTEMS - PERTINENT POSITIVES ONLY: 12 point review of systems negative other than HPI and PMH except for knee pain, dry eyes, runny nose, dry cough, incontinence, back pain, goint pain, arthritis, difficulty walking.  Diabetes.    EXAM: Wt Readings from Last 3 Encounters:  07/22/13 207 lb 8 oz (94.121 kg)   Temp Readings from Last 3 Encounters:  07/22/13 97.9 F (36.6 C) Oral   BP Readings from Last 3 Encounters:  07/22/13 147/69   Pulse Readings from Last 3 Encounters:  07/22/13 61   Gen:  No acute distress.  Well nourished and well groomed.  Looks younger than stated age. Neurological: Alert and oriented to person, place, and time. Coordination normal.  Head: Normocephalic and atraumatic.  Eyes: Conjunctivae are normal. Pupils are equal, round, and reactive to light. No scleral icterus.  Neck: Normal range of motion. Neck supple. No tracheal deviation or thyromegaly present.  Breast:  Dense breast tissue.  No palpable masses.  Small hematoma on right breast.  No nipple retraction or discharge.  No skin dimpling.  No LAD.    Cardiovascular: Normal rate, regular rhythm, normal heart sounds and intact distal pulses.  Exam reveals no gallop and no friction rub.  No murmur heard. Respiratory: Effort normal.  No respiratory distress. No chest wall tenderness. Breath sounds normal.  No wheezes, rales or rhonchi.  GI: Soft. Bowel sounds are normal. The abdomen is soft and nontender.  There is no rebound and no guarding.  Musculoskeletal: Normal range of motion. Extremities are nontender.  Lymphadenopathy: No cervical, preauricular, postauricular or axillary adenopathy is present Skin: Skin is warm and dry. No rash noted. No diaphoresis. No erythema. No pallor. No clubbing, cyanosis, or edema.   Psychiatric: Normal mood and affect. Behavior is normal. Judgment and thought content normal.    LABORATORY RESULTS: Available labs are reviewed  Diagnosis Breast, right, needle core biopsy, mass, 1 o' clock - INVASIVE MAMMARY CARCINOMA - MAMMARY CARCINOMA IN SITU. - SEE COMMENTS. CMET, CBC essentially normal other than  Ca 10.7  RADIOLOGY RESULTS: See E-Chart or I-Site for most recent results.  Images and reports are reviewed. MRI IMPRESSION:  1.3 x 0.9 x 0.9 cm area of post biopsy change and possible minimal  residual malignancy in the upper inner right breast. Otherwise,  unremarkable examination  Dx mammo Ultrasound is performed, showing a 7 x 7 x 5 mm irregular hypoechoic  mass within the right breast 1 o'clock position 5 cm from the  nipple.  IMPRESSION:  Suspicious right breast mass. Ultrasound-guided core needle biopsy  is recommended.   ASSESSMENT AND PLAN: Breast cancer of upper-inner quadrant of right female breast Patient has new diagnosis of cT1N0 right breast cancer. She is a good candidate for breast conservation. We will schedule her for right lumpectomy and sentinel lymph node biopsy.  The surgical procedure was described to the patient.  I discussed the incision type and location and that we  would need radiology involved on the day of surgery with a wire marker and sentinel node.      The risks and benefits of the procedure were described to the patient and she wishes to proceed.    We discussed the risks bleeding, infection, damage to other structures, need for further procedures/surgeries.  We discussed the risk of seroma.  The patient was advised if the margins are positive, we may need to go back to surgery for additional tissue. The patient was advised that these are the most  common complications, but that others can occur as well.  They were advised against taking aspirin or other anti-inflammatory agents/blood thinners the week before surgery.    45 min spent in counseling, exam, evaluation, and coordination of care.  >50% spent in counseling.        Milus Height MD Surgical On[facility]      Visit Diagnoses: 1. Breast cancer of upper-inner quadrant of right female breast     Primary Care Physician: Irven Shelling, MD  [facility] Rolls, med onc [place], rad onc.

## 2013-08-11 NOTE — Op Note (Signed)
Right Breast Lumpectomy with Sentinel Node Mapping and Biopsy Procedure Note  Indications: This patient presents with history of right breast cancer with clinically negative axillary lymph node exam.  Pre-operative Diagnosis: right breast cancer  Post-operative Diagnosis: right breast cancer  Surgeon: Stark Klein   Assistants: n/a  Anesthesia: General LMA anesthesia and Local anesthesia 1% plain lidocaine, 0.25.% bupivacaine, with epinephrine  ASA Class: 3  Procedure Details  The patient was seen in the Holding Room. The risks, benefits, complications, treatment options, and expected outcomes were discussed with the patient. The possibilities of reaction to medication, pulmonary aspiration, bleeding, infection, the need for additional procedures, failure to diagnose a condition, and creating a complication requiring transfusion or operation were discussed with the patient. The patient concurred with the proposed plan, giving informed consent.  The site of surgery properly noted/marked. The patient was taken to Operating Room # 8, identified as THELMA LORENZETTI and the procedure verified as Breast Lumpectomy and Sentinel Node Biopsy. A Time Out was held and the above information confirmed.   The lumpectomy was performed by creating an oblique incision over the upper inner quadrant of the breastaround the previously placed localization guidewire.  Dissection was carried down to the pectoral fascia.  The specimen margins were inked.  Specimen radiography confirmed inclusion of the mammographic lesion.  Hemostasis was achieved with cautery.  The wound was irrigated and closed with a 3-0 Vicryl deep dermal interrupted and a 4-0 Monocryl subcuticular closure in layers.   After induction of anesthesia, the right arm, breast, and chest were prepped and draped in standard fashion.  Using a hand-held gamma probe, axillary sentinel nodes were identified transcutaneously.  An oblique incision was created  below the axillary hairline.  Dissection was carried through the clavipectoral fascia.  2 level 2 axillary sentinel nodes were removed and submitted to pathology. The axillary incision was closed with 3-0 vicryl deep dermal interrupted sutures and a 4-0 Vicryl subcuticular closure in layers.      Sterile dressings were applied. At the end of the operation, all sponge, instrument, and needle counts were correct.  Findings: grossly clear surgical margins and 2 axillary sentinel nodes.  #1 hot, cps 20; #2 hot, cps 390; background 5 cps.  No palpable adenopathy.  Posterior margin is pectoralis fascia.    Estimated Blood Loss:  Minimal         Drains: none         Specimens: right breast partial mastectomy, 2 axillary sentinel nodes.                  Complications:  None; patient tolerated the procedure well.         Disposition: PACU - hemodynamically stable.         Condition: stable

## 2013-08-11 NOTE — Discharge Instructions (Addendum)
[facility] °Office Phone Number [phone] ° °BREAST BIOPSY/ PARTIAL MASTECTOMY: POST OP INSTRUCTIONS ° °Always review your discharge instruction sheet given to you by the facility where your surgery was performed. ° °IF YOU HAVE DISABILITY OR FAMILY LEAVE FORMS, YOU MUST BRING THEM TO THE OFFICE FOR PROCESSING.  DO NOT GIVE THEM TO YOUR DOCTOR. ° °1. A prescription for pain medication may be given to you upon discharge.  Take your pain medication as prescribed, if needed.  If narcotic pain medicine is not needed, then you may take acetaminophen (Tylenol) or ibuprofen (Advil) as needed. °2. Take your usually prescribed medications unless otherwise directed °3. If you need a refill on your pain medication, please contact your pharmacy.  They will contact our office to request authorization.  Prescriptions will not be filled after 5pm or on week-ends. °4. You should eat very light the first 24 hours after surgery, such as soup, crackers, pudding, etc.  Resume your normal diet the day after surgery. °5. Most patients will experience some swelling and bruising in the breast.  Ice packs and a good support bra will help.  Swelling and bruising can take several days to resolve.  °6. It is common to experience some constipation if taking pain medication after surgery.  Increasing fluid intake and taking a stool softener will usually help or prevent this problem from occurring.  A mild laxative (Milk of Magnesia or Miralax) should be taken according to package directions if there are no bowel movements after 48 hours. °7. Unless discharge instructions indicate otherwise, you may remove your bandages 48 hours after surgery, and you may shower at that time.  You may have steri-strips (small skin tapes) in place directly over the incision.  These strips should be left on the skin for 7-10 days.   Any sutures or staples will be removed at the office during your follow-up visit. °8. ACTIVITIES:  You may resume  regular daily activities (gradually increasing) beginning the next day.  Wearing a good support bra or sports bra (or the breast binder) minimizes pain and swelling.  You may have sexual intercourse when it is comfortable. °a. You may drive when you no longer are taking prescription pain medication, you can comfortably wear a seatbelt, and you can safely maneuver your car and apply brakes. °b. RETURN TO WORK:  __________1 week_______________ °9. You should see your doctor in the office for a follow-up appointment approximately two weeks after your surgery.  Your doctor’s nurse will typically make your follow-up appointment when she calls you with your pathology report.  Expect your pathology report 2-3 business days after your surgery.  You may call to check if you do not hear from us after three days. ° ° °WHEN TO CALL YOUR DOCTOR: °1. Fever over 101.0 °2. Nausea and/or vomiting. °3. Extreme swelling or bruising. °4. Continued bleeding from incision. °5. Increased pain, redness, or drainage from the incision. ° °The clinic staff is available to answer your questions during regular business hours.  Please don’t hesitate to call and ask to speak to one of the nurses for clinical concerns.  If you have a medical emergency, go to the nearest emergency room or call 911.  A surgeon from [facility] is always on call at the hospital. ° °For further questions, please visit [email or web address]  ° ° °Post Anesthesia Home Care Instructions ° °Activity: °Get plenty of rest for the remainder of the day. A responsible adult should stay with you for 24   hours following the procedure.  °For the next 24 hours, DO NOT: °-Drive a car °-Operate machinery °-Drink alcoholic beverages °-Take any medication unless instructed by your physician °-Make any legal decisions or sign important papers. ° °Meals: °Start with liquid foods such as gelatin or soup. Progress to regular foods as tolerated. Avoid greasy, spicy, heavy  foods. If nausea and/or vomiting occur, drink only clear liquids until the nausea and/or vomiting subsides. Call your physician if vomiting continues. ° °Special Instructions/Symptoms: °Your throat may feel dry or sore from the anesthesia or the breathing tube placed in your throat during surgery. If this causes discomfort, gargle with warm salt water. The discomfort should disappear within 24 hours. ° ° °

## 2013-08-11 NOTE — Anesthesia Procedure Notes (Signed)
Procedure Name: LMA Insertion Date/Time: 08/11/2013 11:39 AM Performed by: Stark Klein Pre-anesthesia Checklist: Patient identified, Emergency Drugs available, Suction available and Patient being monitored Patient Re-evaluated:Patient Re-evaluated prior to inductionOxygen Delivery Method: Circle System Utilized Preoxygenation: Pre-oxygenation with 100% oxygen Intubation Type: IV induction Ventilation: Mask ventilation without difficulty LMA: LMA inserted LMA Size: 4.0 Number of attempts: 1 Airway Equipment and Method: bite block Placement Confirmation: positive ETCO2 and breath sounds checked- equal and bilateral Tube secured with: Tape Dental Injury: Teeth and Oropharynx as per pre-operative assessment

## 2013-08-11 NOTE — Transfer of Care (Signed)
Immediate Anesthesia Transfer of Care Note  Patient: Laurie Fox  Procedure(s) Performed: Procedure(s): RIGHT BREAST LUMPECTOMY WITH NEEDLE LOCALIZATION AND AXILLARY SENTINEL LYMPH NODE BIOPSY (Right)  Patient Location: PACU  Anesthesia Type:General  Level of Consciousness: awake, alert  and oriented  Airway & Oxygen Therapy: Patient Spontanous Breathing and Patient connected to face mask oxygen  Post-op Assessment: Report given to PACU RN, Post -op Vital signs reviewed and stable and Patient moving all extremities  Post vital signs: Reviewed and stable  Complications: No apparent anesthesia complications

## 2013-08-11 NOTE — Anesthesia Preprocedure Evaluation (Addendum)
Anesthesia Evaluation  Patient identified by MRN, date of birth, ID band Patient awake    Reviewed: Allergy & Precautions, H&P , NPO status , Patient's Chart, lab work & pertinent test results  History of Anesthesia Complications Negative for: history of anesthetic complications  Airway Mallampati: II  Neck ROM: Full    Dental  (+) Teeth Intact   Pulmonary neg pulmonary ROS,  breath sounds clear to auscultation        Cardiovascular hypertension, Rhythm:Regular Rate:Normal     Neuro/Psych negative neurological ROS     GI/Hepatic GERD-  ,  Endo/Other  diabetes  Renal/GU      Musculoskeletal   Abdominal   Peds  Hematology   Anesthesia Other Findings   Reproductive/Obstetrics                          Anesthesia Physical Anesthesia Plan  ASA: II  Anesthesia Plan: General   Post-op Pain Management:    Induction: Intravenous  Airway Management Planned: LMA  Additional Equipment:   Intra-op Plan:   Post-operative Plan: Extubation in OR  Informed Consent: I have reviewed the patients History and Physical, chart, labs and discussed the procedure including the risks, benefits and alternatives for the proposed anesthesia with the patient or authorized representative who has indicated his/her understanding and acceptance.   Dental advisory given  Plan Discussed with: CRNA and Surgeon  Anesthesia Plan Comments:         Anesthesia Quick Evaluation

## 2013-08-11 NOTE — Anesthesia Postprocedure Evaluation (Signed)
Anesthesia Post Note  Patient: Laurie Fox  Procedure(s) Performed: Procedure(s) (LRB): RIGHT BREAST LUMPECTOMY WITH NEEDLE LOCALIZATION AND AXILLARY SENTINEL LYMPH NODE BIOPSY (Right)  Anesthesia type: General  Patient location: PACU  Post pain: Pain level controlled  Post assessment: Patient's Cardiovascular Status Stable  Last Vitals:  Filed Vitals:   08/11/13 1345  BP: 135/65  Pulse: 51  Temp:   Resp: 15    Post vital signs: Reviewed and stable  Level of consciousness: alert  Complications: No apparent anesthesia complications

## 2013-08-12 ENCOUNTER — Encounter (HOSPITAL_BASED_OUTPATIENT_CLINIC_OR_DEPARTMENT_OTHER): Payer: Self-pay | Admitting: General Surgery

## 2013-08-14 NOTE — Progress Notes (Signed)
Quick Note:  Please let patient know margins are negative and lymph nodes are negative.  ______ 

## 2013-08-17 ENCOUNTER — Encounter: Payer: Self-pay | Admitting: *Deleted

## 2013-08-17 NOTE — Progress Notes (Signed)
No oncotype per Dr. Humphrey Rolls.

## 2013-08-18 ENCOUNTER — Telehealth (INDEPENDENT_AMBULATORY_CARE_PROVIDER_SITE_OTHER): Payer: Self-pay

## 2013-08-18 NOTE — Telephone Encounter (Signed)
Pt made aware margins are negative and lymph nodes are negative.  Reminded of her post op appt with Dr. Barry Dienes on 08/25/13.

## 2013-08-25 ENCOUNTER — Encounter (INDEPENDENT_AMBULATORY_CARE_PROVIDER_SITE_OTHER): Payer: Self-pay | Admitting: General Surgery

## 2013-08-25 ENCOUNTER — Ambulatory Visit (INDEPENDENT_AMBULATORY_CARE_PROVIDER_SITE_OTHER): Payer: Medicare Other | Admitting: General Surgery

## 2013-08-25 VITALS — BP 126/76 | HR 60 | Temp 99.0°F | Resp 18 | Ht 62.0 in | Wt 211.0 lb

## 2013-08-25 DIAGNOSIS — C50219 Malignant neoplasm of upper-inner quadrant of unspecified female breast: Secondary | ICD-10-CM

## 2013-08-25 DIAGNOSIS — C50211 Malignant neoplasm of upper-inner quadrant of right female breast: Secondary | ICD-10-CM

## 2013-08-25 NOTE — Progress Notes (Signed)
HISTORY: Patient is approximately 2 weeks status post right lumpectomy and sentinel lymph node biopsy for breast cancer. She is doing well. She has had minimal soreness.  She denies fevers and chills. She has had so little soreness that she says sometimes she forgets that she have surgery. She has full range of motion of her arm. She has followup for a scheduled with other disciplines.    EXAM: General:  Alert and oriented.   Incision:  Healing well.  Small to moderate seroma.     PATHOLOGY: 1. Breast, lumpectomy, Right - INVASIVE GRADE II LOBULAR CARCINOMA SPANNING 1.1 CM IN GREATEST DIMENSION. - ASSOCIATED LOBULAR CARCINOMA IN SITU. - MARGINS ARE NEGATIVE. - SEE ONCOLOGY TEMPLATE. 2. Lymph node, sentinel, biopsy, Right axillary #1 - ONE BENIGN LYMPH NODE WITH NO TUMOR SEEN (0/1). - SEE COMMENT. 3. Lymph node, sentinel, biopsy, Right axillary #2 - ONE BENIGN LYMPH NODE WITH NO TUMOR SEEN (0/1). - SEE COMMENT.   ASSESSMENT AND PLAN:   Breast cancer of upper-inner quadrant of right female breast No evidence of surgical complications. She has anticipated postop seroma but this is small to moderate in size.  I will see her back in approximately 3 months unless she develops redness or pain in her breast. She has followup appointments scheduled with radiation oncology and medical oncology.        Milus Height, MD Surgical Oncology, [facility]  Irven Shelling, MD Irven Shelling, MD

## 2013-08-25 NOTE — Patient Instructions (Signed)
OK to start liberalizing activity.  Follow up in 3 months.  Call if you develop pain/redness, fevers/ chills.

## 2013-08-25 NOTE — Assessment & Plan Note (Signed)
No evidence of surgical complications. She has anticipated postop seroma but this is small to moderate in size.  I will see her back in approximately 3 months unless she develops redness or pain in her breast. She has followup appointments scheduled with radiation oncology and medical oncology.

## 2013-08-26 ENCOUNTER — Encounter: Payer: Self-pay | Admitting: *Deleted

## 2013-08-26 ENCOUNTER — Telehealth: Payer: Self-pay | Admitting: Oncology

## 2013-08-26 ENCOUNTER — Encounter: Payer: Self-pay | Admitting: Oncology

## 2013-08-26 ENCOUNTER — Ambulatory Visit (HOSPITAL_BASED_OUTPATIENT_CLINIC_OR_DEPARTMENT_OTHER): Payer: Medicare Other | Admitting: Oncology

## 2013-08-26 ENCOUNTER — Encounter (INDEPENDENT_AMBULATORY_CARE_PROVIDER_SITE_OTHER): Payer: Self-pay

## 2013-08-26 VITALS — BP 179/78 | HR 62 | Temp 98.6°F | Resp 18 | Ht 62.0 in | Wt 211.0 lb

## 2013-08-26 DIAGNOSIS — C50219 Malignant neoplasm of upper-inner quadrant of unspecified female breast: Secondary | ICD-10-CM

## 2013-08-26 DIAGNOSIS — Z170 Estrogen receptor positive status [ER+]: Secondary | ICD-10-CM

## 2013-08-26 DIAGNOSIS — C50211 Malignant neoplasm of upper-inner quadrant of right female breast: Secondary | ICD-10-CM

## 2013-08-26 NOTE — Progress Notes (Unsigned)
Location of Breast Cancer:Right, mass   7  0'clock position Upper inner quadrant  Histology per Pathology Report: Diagnosis 07/15/13:Breast, right, needle core biopsy, mass, 1 o' clock- INVASIVE MAMMARY CARCINOMA- MAMMARY CARCINOMA IN SITU.  Receptor Status: ER(  + ), PR (  + ), Her2-neu (  Neg. )  Did patient present with symptoms (if so, please note symptoms) or was this found on screening mammography?: found on annual mammogram screening  Past/Anticipated interventions by surgeon, if EEF:[id number] 08/11/13:1. Breast, lumpectomy, Right- INVASIVE GRADE II LOBULAR CARCINOMA SPANNING 1.1 CM IN GREATEST DIMENSION.- ASSOCIATED LOBULAR CARCINOMA IN SITU.- MARGINS ARE NEGATIVE.- SEE ONCOLOGY TEMPLATE.2. Lymph node, sentinel, biopsy, Right axillary #1- ONE BENIGN LYMPH NODE WITH NO TUMOR SEEN (0/1).- SEE COMMENT. 3. Lymph node, sentinel, biopsy, Right axillary #2- ONE BENIGN LYMPH NODE WITH NO TUMOR SEEN (0/1). Dr. Stark Klein, last seen 08/25/13  Small to moderate seroma ,healing well  Past/Anticipated interventions by medical oncology, if any: seen in multidisciplinary clinic [place]  07/22/13 next appt  09/25/13,   Lymphedema issues, if any:  Chronic  Swelling right lower extremity     Pain issues, if any: no, just itching at incision rt breast, incisions well healed   SAFETY ISSUES:  Prior radiation? NO  Pacemaker/ICD? NO  Possible current pregnancy NO  Is the patient on methotrexate? No  Current Complaints / other details:  Seen in breast clinic 07/22/13, Widowed, menarche age 14, 2 children 12st live birth age 80, used OCP;s for 6 years, no HRT,menopause age 50 non smoker. No alcohol or illicit drug use Father colon cancer age 94, deceased age 47    Tashona Calk, Felicita Gage, RN 08/26/2013,10:32 AM

## 2013-08-26 NOTE — Progress Notes (Signed)
OFFICE PROGRESS NOTE  CC**  Irven Shelling, MD [address], [address] [place] [address] Kyung Rudd  Dr. Stark Klein  DIAGNOSIS: 80 year old female with invasive lobular carcinoma and in situ of the right breast diagnosed December 2014   PRIOR THERAPY:  #1Patient had an annual screening mammogram. In her right breast she was noted to have a mass at the 1:00 position. By ultrasound it measured 7 mm. MRI reveals 1.3 cm area postbiopsy changes. Core needle biopsy of the mass at 1:00 position revealed a grade 1/2 invasive ductal carcinoma/DCIS. Tumor was ER positive PR positive HER-2/neu negative with a proliferation marker Ki-67 30%  #2 patient is status post right lumpectomy with the final pathology revealing a 1.1 cm invasive lobular and in situ carcinoma that is strongly estrogen receptor and progesterone receptor positive HER-2/neu negative. 2 sentinel nodes were negative for metastatic disease. Proliferation marker Ki-67 25%.  CURRENT THERAPY: Refer to radiation oncology for adjuvant radiation therapy consultation  INTERVAL HISTORY: Laurie Fox 80 y.o. female returns for followup visit post lumpectomy. Overall she's doing well. Her surgical site is healing very nicely. She herself is without any significant complaints. She denies any headaches double vision blurring of vision fevers chills night sweats shortness of breath chest pains or palpitations no myalgias and arthralgias. She does have some swelling in the right lower extremity which is chronic and ongoing. She has no calf tenderness. She has not had any vaginal bleeding or discharge. Patient's last bone density scan was approximately 2 years ago. Remainder of the 10 point review of systems is negative.  MEDICAL HISTORY: Past Medical History  Diagnosis Date  . Diabetes mellitus without complication   . Hypertension   . Arthritis   . Wears glasses   . GERD (gastroesophageal reflux disease)      ALLERGIES:  is allergic to codeine and sulfa antibiotics.  MEDICATIONS:  Current Outpatient Prescriptions  Medication Sig Dispense Refill  . acetaminophen (TYLENOL) 325 MG tablet Take 650 mg by mouth every 6 (six) hours as needed.      Marland Kitchen amLODipine (NORVASC) 10 MG tablet Take 10 mg by mouth daily.      Marland Kitchen aspirin 81 MG tablet Take 81 mg by mouth daily.      . carboxymethylcellulose (REFRESH PLUS) 0.5 % SOLN 1 drop 3 (three) times daily as needed.      . carvedilol (COREG) 25 MG tablet Take 25 mg by mouth 2 (two) times daily with a meal.      . cycloSPORINE (RESTASIS) 0.05 % ophthalmic emulsion 1 drop 2 (two) times daily.      Marland Kitchen MELATONIN PO Take 2.5 mg by mouth. 1/2 tablet once a day      . metFORMIN (GLUCOPHAGE) 500 MG tablet Take 500 mg by mouth 2 (two) times daily with a meal. 3 tablets in am, 2 tablets in evening      . Multiple Vitamin (MULTI-VITAMIN DAILY PO) Take by mouth.      Marland Kitchen omeprazole (PRILOSEC) 20 MG capsule Take 20 mg by mouth daily.      . simvastatin (ZOCOR) 20 MG tablet Take 20 mg by mouth daily at 6 PM.       . telmisartan-hydrochlorothiazide (MICARDIS HCT) 40-12.5 MG per tablet Take 1 tablet by mouth daily.      . vitamin B-12 (CYANOCOBALAMIN) 100 MCG tablet Take 100 mcg by mouth daily.      Marland Kitchen VITAMIN D, ERGOCALCIFEROL, PO Take by mouth.  No current facility-administered medications for this visit.    SURGICAL HISTORY:  Past Surgical History  Procedure Laterality Date  . Knee surgery  2831,[id number]    lt and rt knee scopes  . Tonsillectomy    . Dilation and curettage of uterus    . Fracture surgery  2006    lt foot  . Colonoscopy    . Breast lumpectomy with needle localization and axillary sentinel lymph node bx Right 08/11/2013    Procedure: RIGHT BREAST LUMPECTOMY WITH NEEDLE LOCALIZATION AND AXILLARY SENTINEL LYMPH NODE BIOPSY;  Surgeon: Stark Klein, MD;  Location: [facility];  Service: General;  Laterality: Right;    REVIEW OF SYSTEMS:   Pertinent items are noted in HPI.   HEALTH MAINTENANCE:  PHYSICAL EXAMINATION: Blood pressure 179/78, pulse 62, temperature 98.6 F (37 C), temperature source Oral, resp. rate 18, height $RemoveBe'5\' 2"'bHCPuSXJh$  (1.575 m), weight 211 lb (95.709 kg). Body mass index is 38.58 kg/(m^2). ECOG PERFORMANCE STATUS: 0 - Asymptomatic   General appearance: alert, cooperative and appears stated age Neck: no adenopathy, no carotid bruit, no JVD, supple, symmetrical, trachea midline and thyroid not enlarged, symmetric, no tenderness/mass/nodules Lymph nodes: Cervical, supraclavicular, and axillary nodes normal. Resp: clear to auscultation bilaterally Back: symmetric, no curvature. ROM normal. No CVA tenderness. Cardio: regular rate and rhythm GI: soft, non-tender; bowel sounds normal; no masses,  no organomegaly Extremities: extremities normal, atraumatic, no cyanosis or edema Neurologic: Grossly normal Breast examination: Right breast with well healed curvilinear surgical scar and sentinel lymph node biopsy scar. No infections. Left breast no masses or nipple discharge.  LABORATORY DATA: Lab Results  Component Value Date   WBC 7.6 07/22/2013   HGB 12.1 07/22/2013   HCT 36.6 07/22/2013   MCV 84.1 07/22/2013   PLT 220 07/22/2013      Chemistry      Component Value Date/Time   NA 143 07/22/2013 1206   K 3.7 07/22/2013 1206   CO2 30* 07/22/2013 1206   BUN 17.7 07/22/2013 1206   CREATININE 0.8 07/22/2013 1206      Component Value Date/Time   CALCIUM 10.7* 07/22/2013 1206   ALKPHOS 72 07/22/2013 1206   AST 15 07/22/2013 1206   ALT 12 07/22/2013 1206   BILITOT 0.42 07/22/2013 1206    Diagnosis 1. Breast, lumpectomy, Right - INVASIVE GRADE II LOBULAR CARCINOMA SPANNING 1.1 CM IN GREATEST DIMENSION. - ASSOCIATED LOBULAR CARCINOMA IN SITU. - MARGINS ARE NEGATIVE. - SEE ONCOLOGY TEMPLATE. 2. Lymph node, sentinel, biopsy, Right axillary #1 - ONE BENIGN LYMPH NODE WITH NO TUMOR SEEN (0/1). - SEE  COMMENT. 3. Lymph node, sentinel, biopsy, Right axillary #2 - ONE BENIGN LYMPH NODE WITH NO TUMOR SEEN (0/1). - SEE COMMENT. Microscopic Comment 1. BREAST, INVASIVE TUMOR, WITH LYMPH NODE SAMPLING Specimen, including laterality and lymph node sampling (sentinel, non-sentinel): Right partial breast with right sentinel lymph node sampling. Procedure: Right breast lumpectomy with right sentinel lymph node biopsies. Histologic type: Invasive lobular carcinoma. Grade: II. Tubule formation: 3. Nuclear pleomorphism: 2. Mitotic: 2. Tumor size (gross measurement and glass slide measurement): 1.1 cm in greatest dimension. Margins: Invasive, distance to closest margin: 0.2 cm (posterior margin). Lymphovascular invasion: Definitive lymphovascular invasion is not identified. Ductal carcinoma in situ: No. Lobular neoplasia: Yes, lobular carcinoma in situ present. Tumor focality: Unifocal. Treatment effect: N/A. Extent of tumor: Tumor confined to breast parenchyma. Skin: Not received. Nipple: Not received. 1 of 3 FINAL for CANDISS, GALEANA ([id number]) Microscopic Comment(continued) Skeletal muscle: Not received. Lymph  nodes: Examined: 2 Sentinel. 0 Non-sentinel. 2 Total. Lymph nodes with metastasis: 0. Breast prognostic profile: Performed on previous case [id number] Estrogen receptor: 100%, positive. Progesterone receptor: 100%, positive. Her 2 neu: 1.40 ratio, no amplification . Ki-67: 25%. Non-neoplastic breast: Fat necrosis present. TNM: pT1c, pN0, MX. Comments: An E-Cadherin immunohistochemical stain is performed on a representative block of tumor. It is negative in both the in situ and invasive components confirming the lobular nature of the tumor. As there was previously no amplification of Her-2 neu by C[facility] this will be repeated on the current tumor and reported in an addendum. 2. , 3. The cytokeratin AE1/AE3 immunostain is performed on both lymph nodes (two stains total).  The stains are negative confirming the lack of metastatic carcinoma. (RH:kh 08-13-13)   RADIOGRAPHIC STUDIES:  Nm Sentinel Node Inj-no Rpt (breast)  08/11/2013   CLINICAL DATA: right breast cancer   Sulfur colloid was injected intradermally by the nuclear medicine  technologist for breast cancer sentinel node localization.    [place] Rt Plc Breast Loc Dev   1st Lesion  Inc US Guide  08/11/2013   CLINICAL DATA:  Known right breast carcinoma. Preoperative needle localization.  EXAM: ULTRASOUND NEEDLE LOCALIZATION OF THE right BREAST  COMPARISON:  Previous exams.  FINDINGS: Patient presents for needle localization prior to surgical excision of right breast carcinoma. I met with the patient and we discussed the procedure of needle localization including benefits and alternatives. We discussed the high likelihood of a successful procedure. We discussed the risks of the procedure, including infection, bleeding, tissue injury, and further surgery. Informed, written consent was given.  The usual time-out protocol was performed immediately prior to the procedure.  Using ultrasound guidance, sterile technique, 2% lidocaine and a 9 cm ultra wire, the mass with adjacent clip waslocalized using a medial approach. The films are marked for Dr. Barry Dienes.  Specimen radiograph is performed at surgery and confirms the mass, clip, and the intact wire to be present in the tissue sample. The specimen is marked for pathology.  IMPRESSION: Needle localization of the mass located within the right breast as discussed above. No apparent complications.   Electronically Signed   By: Luberta Robertson M.D.   On: 08/11/2013 16:00    ASSESSMENT: 80 year old female with new diagnosis of invasive lobular and in situ carcinoma of the right breast originally diagnosed in December 2014. Patient was seen in the multidisciplinary breast clinic at that time. Her tumor was estrogen receptor +100% progesterone receptor +100% HER-2/neu negative with a  proliferation marker Ki-67 25%. She is now status post right lumpectomy with sentinel lymph node biopsy. Final pathology did reveal 1.1 cm invasive lobular carcinoma 2 sentinel nodes were negative for metastatic disease ER/PR positive HER-2/neu negative. Postoperatively patient continues to do well. She is seen now to discuss adjuvant treatment options. Patient and I discussed adjuvant antiestrogen therapy. I think overall she would be a good candidate for this. In my clinical opinion she does not need adjuvant chemotherapy due to low risk of having recurrence. Because patient has had a lumpectomy certainly she would be a good candidate for adjuvant radiation therapy. She was seen by Dr. Kyung Rudd in the [facility]. She has an upcoming appointment with him. In the meantime I have discussed different antiestrogen therapies with her. This would certainly include tamoxifen versus aromatase inhibitors. We discussed side effects of tamoxifen as well as aromatase inhibitors. We discussed endometrial cancer and risk of blood clots with tamoxifen as well as strokes. We  discussed osteopenia/osteoporosis and myalgias or arthralgias etc. for a use. I gave her more information on Arimidex. This will not begin until after radiation therapy is completed.   PLAN:   #1 patient will proceed with consultation with Dr. Kyung Rudd for consideration of adjuvant radiation therapy.  #2 I will plan on seeing the patient back on Feb 20th 2015. Prior to her return I will plan on getting a bone density scan this is been ordered.   All questions were answered. The patient knows to call the clinic with any problems, questions or concerns. We can certainly see the patient much sooner if necessary.  I spent 20 minutes counseling the patient face to face. The total time spent in the appointment was 30 minutes.    Marcy Panning, MD Medical/Oncology [facility] [phone] (beeper) [phone] (Office)  08/26/2013, 8:51  AM

## 2013-08-26 NOTE — Telephone Encounter (Signed)
, °

## 2013-08-26 NOTE — Patient Instructions (Signed)
Proceed with radiation oncology consult  Bone density scan  We discussed arimidex   Anastrozole tablets What is this medicine? ANASTROZOLE (an AS troe zole) is used to treat breast cancer in women who have gone through menopause. Some types of breast cancer depend on estrogen to grow, and this medicine can stop tumor growth by blocking estrogen production. This medicine may be used for other purposes; ask your health care provider or pharmacist if you have questions. COMMON BRAND NAME(S): Arimidex What should I tell my health care provider before I take this medicine? They need to know if you have any of these conditions: -liver disease -an unusual or allergic reaction to anastrozole, other medicines, foods, dyes, or preservatives -pregnant or trying to get pregnant -breast-feeding How should I use this medicine? Take this medicine by mouth with a glass of water. Follow the directions on the prescription label. You can take this medicine with or without food. Take your doses at regular intervals. Do not take your medicine more often than directed. Do not stop taking except on the advice of your doctor or health care professional. Talk to your pediatrician regarding the use of this medicine in children. Special care may be needed. Overdosage: If you think you have taken too much of this medicine contact a poison control center or emergency room at once. NOTE: This medicine is only for you. Do not share this medicine with others. What if I miss a dose? If you miss a dose, take it as soon as you can. If it is almost time for your next dose, take only that dose. Do not take double or extra doses. What may interact with this medicine? Do not take this medicine with any of the following medications: -female hormones, like estrogens or progestins and birth control pills This medicine may also interact with the following medications: -tamoxifen This list may not describe all possible  interactions. Give your health care provider a list of all the medicines, herbs, non-prescription drugs, or dietary supplements you use. Also tell them if you smoke, drink alcohol, or use illegal drugs. Some items may interact with your medicine. What should I watch for while using this medicine? Visit your doctor or health care professional for regular checks on your progress. Let your doctor or health care professional know about any unusual vaginal bleeding. Do not treat yourself for diarrhea, nausea, vomiting or other side effects. Ask your doctor or health care professional for advice. What side effects may I notice from receiving this medicine? Side effects that you should report to your doctor or health care professional as soon as possible: -allergic reactions like skin rash, itching or hives, swelling of the face, lips, or tongue -any new or unusual symptoms -breathing problems -chest pain -leg pain or swelling -vomiting Side effects that usually do not require medical attention (report to your doctor or health care professional if they continue or are bothersome): -back or bone pain -cough, or throat infection -diarrhea or constipation -dizziness -headache -hot flashes -loss of appetite -nausea -sweating -weakness and tiredness -weight gain This list may not describe all possible side effects. Call your doctor for medical advice about side effects. You may report side effects to FDA at 1-800-FDA-1088. Where should I keep my medicine? Keep out of the reach of children. Store at room temperature between 20 and 25 degrees C (68 and 77 degrees F). Throw away any unused medicine after the expiration date. NOTE: This sheet is a summary. It may not cover  all possible information. If you have questions about this medicine, talk to your doctor, pharmacist, or health care provider.  2014, Elsevier/Gold Standard. (2007-10-03 16:31:52)

## 2013-08-27 ENCOUNTER — Ambulatory Visit
Admission: RE | Admit: 2013-08-27 | Discharge: 2013-08-27 | Disposition: A | Discharge status: Home / Self Care | Payer: Medicare Other | Source: Ambulatory Visit | Attending: Radiation Oncology | Admitting: Radiation Oncology

## 2013-08-27 ENCOUNTER — Encounter: Payer: Self-pay | Admitting: Radiation Oncology

## 2013-08-27 ENCOUNTER — Encounter: Payer: Self-pay | Admitting: *Deleted

## 2013-08-27 VITALS — BP 166/77 | HR 66 | Temp 97.3°F | Resp 20 | Ht 62.5 in | Wt 211.7 lb

## 2013-08-27 DIAGNOSIS — Z79899 Other long term (current) drug therapy: Secondary | ICD-10-CM | POA: Insufficient documentation

## 2013-08-27 DIAGNOSIS — C50919 Malignant neoplasm of unspecified site of unspecified female breast: Secondary | ICD-10-CM | POA: Insufficient documentation

## 2013-08-27 DIAGNOSIS — C50211 Malignant neoplasm of upper-inner quadrant of right female breast: Secondary | ICD-10-CM

## 2013-08-27 DIAGNOSIS — Z170 Estrogen receptor positive status [ER+]: Secondary | ICD-10-CM | POA: Insufficient documentation

## 2013-08-27 DIAGNOSIS — Z7982 Long term (current) use of aspirin: Secondary | ICD-10-CM | POA: Insufficient documentation

## 2013-08-27 NOTE — Progress Notes (Signed)
Radiation Oncology         [phone] ________________________________  Name: BRIDGID PRINTZ MRN: [id number]  Date: 08/27/2013  DOB: March 18, 1932  Follow-Up Visit Note  CC: Irven Shelling, MD  Stark Klein, MD  Diagnosis:   Invasive lobular carcinoma of the right breast, T1 C. N0 M0, ER positive, PR positive, HER-2/neu negative  Interval Since Last Radiation:  Not applicable   Narrative:  The patient returns today for routine follow-up.  The patient was initially seen in multidisciplinary breast clinic. We discussed breast conservation treatment is a good option for the patient and she has proceeded to undergo a right-sided breast lumpectomy on 08/11/2013. This revealed a grade 2 lobular carcinoma measuring 1.1 cm. The margins were negative and none of the 2 sentinel lymph nodes were positive for carcinoma. The patient indicates that she has recovered well postoperatively.                              ALLERGIES:  is allergic to codeine and sulfa antibiotics.  Meds: Current Outpatient Prescriptions  Medication Sig Dispense Refill  . acetaminophen (TYLENOL) 325 MG tablet Take 650 mg by mouth every 6 (six) hours as needed.      Marland Kitchen amLODipine (NORVASC) 10 MG tablet Take 10 mg by mouth daily.      Marland Kitchen aspirin 81 MG tablet Take 81 mg by mouth daily.      . carboxymethylcellulose (REFRESH PLUS) 0.5 % SOLN 1 drop 3 (three) times daily as needed.      . carvedilol (COREG) 25 MG tablet Take 25 mg by mouth 2 (two) times daily with a meal.      . cycloSPORINE (RESTASIS) 0.05 % ophthalmic emulsion 1 drop 2 (two) times daily.      Marland Kitchen MELATONIN PO Take 2.5 mg by mouth. 1/2 tablet once a day      . metFORMIN (GLUCOPHAGE) 500 MG tablet Take 500 mg by mouth 2 (two) times daily with a meal. 3 tablets in am, 2 tablets in evening      . Multiple Vitamin (MULTI-VITAMIN DAILY PO) Take by mouth.      Marland Kitchen omeprazole (PRILOSEC) 20 MG capsule Take 20 mg by mouth daily.      . simvastatin (ZOCOR) 20 MG tablet  Take 20 mg by mouth daily at 6 PM.       . telmisartan-hydrochlorothiazide (MICARDIS HCT) 40-12.5 MG per tablet Take 1 tablet by mouth daily.      . vitamin B-12 (CYANOCOBALAMIN) 100 MCG tablet Take 100 mcg by mouth daily.      Marland Kitchen VITAMIN D, ERGOCALCIFEROL, PO Take by mouth.       No current facility-administered medications for this encounter.    Physical Findings: The patient is in no acute distress. Patient is alert and oriented.  height is 5' 2.5" (1.588 m) and weight is 211 lb 11.2 oz (96.026 kg). Her oral temperature is 97.3 F (36.3 C). Her blood pressure is 166/77 and her pulse is 66. Her respiration is 20. .   The surgical site is healing well at this time. No sign of infection.  Lab Findings: Lab Results  Component Value Date   WBC 7.6 07/22/2013   HGB 12.1 07/22/2013   HCT 36.6 07/22/2013   MCV 84.1 07/22/2013   PLT 220 07/22/2013     Radiographic Findings: Nm Sentinel Node Inj-no Rpt (breast)  08/11/2013   CLINICAL DATA: right breast cancer  Sulfur colloid was injected intradermally by the nuclear medicine  technologist for breast cancer sentinel node localization.    [place] Rt Plc Breast Loc Dev   1st Lesion  Inc US Guide  08/11/2013   CLINICAL DATA:  Known right breast carcinoma. Preoperative needle localization.  EXAM: ULTRASOUND NEEDLE LOCALIZATION OF THE right BREAST  COMPARISON:  Previous exams.  FINDINGS: Patient presents for needle localization prior to surgical excision of right breast carcinoma. I met with the patient and we discussed the procedure of needle localization including benefits and alternatives. We discussed the high likelihood of a successful procedure. We discussed the risks of the procedure, including infection, bleeding, tissue injury, and further surgery. Informed, written consent was given.  The usual time-out protocol was performed immediately prior to the procedure.  Using ultrasound guidance, sterile technique, 2% lidocaine and a 9 cm ultra wire, the  mass with adjacent clip waslocalized using a medial approach. The films are marked for Dr. Barry Dienes.  Specimen radiograph is performed at surgery and confirms the mass, clip, and the intact wire to be present in the tissue sample. The specimen is marked for pathology.  IMPRESSION: Needle localization of the mass located within the right breast as discussed above. No apparent complications.   Electronically Signed   By: Luberta Robertson M.D.   On: 08/11/2013 16:00    Impression:    The patient has done well and is status post a lumpectomy for a T1 C. N0 M0 invasive lobular carcinoma of the right breast. This represents an ER positive, PR positive tumor which is HER-2/neu negative.  I discussed with the patient once again that she is in a favorable category. I would expect her prognosis should be quite good with or without adjuvant radiation treatment. I do not believe there would be a substantial survival benefit for the patient. They're likely would be a reasonably small local control benefit. The patient is quite active at 80 years old and she does express once again an interest in being aggressive with treatment. We discussed the pros and cons of this approach and she made a decision to proceed with adjuvant radiation treatment at this time.  Plan:  The patient will undergo simulation in the near future such that we can proceed with treatment. The patient will undergo a four-week hypo-fractionated course of radiation treatment.  I spent 20 minutes with the patient today, the majority of which was spent counseling the patient on the diagnosis of cancer and coordinating care.   Jodelle Gross, M.D., Ph.D.

## 2013-08-27 NOTE — Progress Notes (Signed)
Please see the Nurse Progress Note in the MD Initial Consult Encounter for this patient. 

## 2013-08-28 ENCOUNTER — Ambulatory Visit
Admission: RE | Admit: 2013-08-28 | Discharge: 2013-08-28 | Disposition: A | Discharge status: Home / Self Care | Payer: Medicare Other | Source: Ambulatory Visit | Attending: Oncology | Admitting: Oncology

## 2013-08-28 DIAGNOSIS — C50211 Malignant neoplasm of upper-inner quadrant of right female breast: Secondary | ICD-10-CM

## 2013-09-03 ENCOUNTER — Other Ambulatory Visit: Payer: Medicare Other | Admitting: Radiation Oncology

## 2013-09-03 ENCOUNTER — Ambulatory Visit: Admission: RE | Admit: 2013-09-03 | Payer: Medicare Other | Source: Ambulatory Visit | Admitting: Radiation Oncology

## 2013-09-07 ENCOUNTER — Ambulatory Visit
Admission: RE | Admit: 2013-09-07 | Discharge: 2013-09-07 | Disposition: A | Discharge status: Home / Self Care | Payer: Medicare Other | Source: Ambulatory Visit | Attending: Radiation Oncology | Admitting: Radiation Oncology

## 2013-09-07 DIAGNOSIS — C50919 Malignant neoplasm of unspecified site of unspecified female breast: Secondary | ICD-10-CM | POA: Insufficient documentation

## 2013-09-07 DIAGNOSIS — Z510 Encounter for antineoplastic radiation therapy: Secondary | ICD-10-CM | POA: Insufficient documentation

## 2013-09-07 DIAGNOSIS — C50211 Malignant neoplasm of upper-inner quadrant of right female breast: Secondary | ICD-10-CM

## 2013-09-07 DIAGNOSIS — Z79899 Other long term (current) drug therapy: Secondary | ICD-10-CM | POA: Insufficient documentation

## 2013-09-07 DIAGNOSIS — Z7982 Long term (current) use of aspirin: Secondary | ICD-10-CM | POA: Insufficient documentation

## 2013-09-07 NOTE — Progress Notes (Addendum)
  Radiation Oncology         [phone] ________________________________  Name: WHISPER KURKA MRN: [id number]  Date: 09/07/2013  DOB: May 01, 1932  SIMULATION AND TREATMENT PLANNING NOTE  The patient presented for simulation prior to beginning her course of radiation treatment for her diagnosis of right-sided breast cancer. The patient was placed in a supine position on a breast board. A customized vac-LOC device was also constructed and this complex treatment device will be used on a daily basis during her treatment. In this fashion, a CT scan was obtained through the chest area and an isocenter was placed near the chest wall within the right breast.  The patient will be planned to receive a course of radiation initially to a dose of 42.5 gray. This will consist of a whole breast radiotherapy technique. To accomplish this, 2 initial customized blocks have been designed which will correspond to medial and lateral whole breast tangent fields. The patient will undergo treatment with a reduced field/forward planning technique to reduce heterogeneity within the target area. The number of additional fields will be determined during the treatment planning process to optimize the patient's treatment. This treatment will be accomplished at 1.8 gray per fraction. A complex isodose plan is requested to ensure that the breast target area is adequately covered dosimetrically. A forward planning technique will also be evaluated to determine if this approach improves the plan. It is anticipated that the patient will then receive a 7.5 gray boost to the seroma cavity which has been contoured. This will be accomplished at 2 gray per fraction. The final anticipated total dose therefore will correspond to 50 gray.   _______________________________   Jodelle Gross, MD, PhD

## 2013-09-14 ENCOUNTER — Ambulatory Visit
Admission: RE | Admit: 2013-09-14 | Discharge: 2013-09-14 | Disposition: A | Discharge status: Home / Self Care | Payer: Medicare Other | Source: Ambulatory Visit | Attending: Radiation Oncology | Admitting: Radiation Oncology

## 2013-09-14 DIAGNOSIS — C50211 Malignant neoplasm of upper-inner quadrant of right female breast: Secondary | ICD-10-CM

## 2013-09-14 NOTE — Progress Notes (Signed)
  Radiation Oncology         [phone] ________________________________  Name: Laurie Fox MRN: [id number]  Date: 09/14/2013  DOB: February 15, 1932  Simulation Verification Note   NARRATIVE: The patient was brought to the treatment unit and placed in the planned treatment position. The clinical setup was verified. Then port films were obtained and uploaded to the radiation oncology medical record software.  The treatment beams were carefully compared against the planned radiation fields. The position, location, and shape of the radiation fields was reviewed. The targeted volume of tissue appears to be appropriately covered by the radiation beams. Based on my personal review, I approved the simulation verification. The patient's treatment will proceed as planned.  ________________________________   Jodelle Gross, MD, PhD

## 2013-09-15 ENCOUNTER — Ambulatory Visit
Admission: RE | Admit: 2013-09-15 | Discharge: 2013-09-15 | Disposition: A | Discharge status: Home / Self Care | Payer: Medicare Other | Source: Ambulatory Visit | Attending: Radiation Oncology | Admitting: Radiation Oncology

## 2013-09-15 DIAGNOSIS — C50211 Malignant neoplasm of upper-inner quadrant of right female breast: Secondary | ICD-10-CM

## 2013-09-15 MED ORDER — RADIAPLEXRX EX GEL
Freq: Once | CUTANEOUS | Status: AC
  Administered 2013-09-15: 15:00:00 via TOPICAL

## 2013-09-15 NOTE — Progress Notes (Signed)
Patient educatin, rad book, radiaplex gel cream given to patient discusses skin irritation, fatigue, pain, diet,increase protein, stay hydrated  with water, to apply radiaplex gel on breat area after rad tx and bedtime, t, can get [employer] deodorant at [employer],  No skin products on skin 4 hours before rad txs dailyTeach back

## 2013-09-16 ENCOUNTER — Ambulatory Visit
Admission: RE | Admit: 2013-09-16 | Discharge: 2013-09-16 | Disposition: A | Discharge status: Home / Self Care | Payer: Medicare Other | Source: Ambulatory Visit | Attending: Radiation Oncology | Admitting: Radiation Oncology

## 2013-09-17 ENCOUNTER — Ambulatory Visit
Admission: RE | Admit: 2013-09-17 | Discharge: 2013-09-17 | Disposition: A | Discharge status: Home / Self Care | Payer: Medicare Other | Source: Ambulatory Visit | Attending: Radiation Oncology | Admitting: Radiation Oncology

## 2013-09-18 ENCOUNTER — Encounter: Payer: Self-pay | Admitting: Radiation Oncology

## 2013-09-18 ENCOUNTER — Ambulatory Visit
Admission: RE | Admit: 2013-09-18 | Discharge: 2013-09-18 | Disposition: A | Discharge status: Home / Self Care | Payer: Medicare Other | Source: Ambulatory Visit | Attending: Radiation Oncology | Admitting: Radiation Oncology

## 2013-09-18 ENCOUNTER — Ambulatory Visit: Admission: RE | Admit: 2013-09-18 | Payer: Medicare Other | Source: Ambulatory Visit | Admitting: Radiation Oncology

## 2013-09-18 VITALS — BP 151/61 | HR 60 | Temp 98.1°F | Resp 20 | Wt 208.1 lb

## 2013-09-18 DIAGNOSIS — C50211 Malignant neoplasm of upper-inner quadrant of right female breast: Secondary | ICD-10-CM

## 2013-09-18 NOTE — Progress Notes (Signed)
   Department of Radiation Oncology  Phone:  [phone] Fax:        [phone]  Weekly Treatment Note    Name: Laurie Fox Date: 09/18/2013 MRN: [id number] DOB: 1932/05/21   Current dose: 10 Gy  Current fraction: 4   MEDICATIONS: Current Outpatient Prescriptions  Medication Sig Dispense Refill  . acetaminophen (TYLENOL) 325 MG tablet Take 650 mg by mouth every 6 (six) hours as needed.      Marland Kitchen amLODipine (NORVASC) 10 MG tablet Take 10 mg by mouth daily.      Marland Kitchen aspirin 81 MG tablet Take 81 mg by mouth daily.      . carboxymethylcellulose (REFRESH PLUS) 0.5 % SOLN 1 drop 3 (three) times daily as needed.      . carvedilol (COREG) 25 MG tablet Take 25 mg by mouth 2 (two) times daily with a meal.      . cycloSPORINE (RESTASIS) 0.05 % ophthalmic emulsion 1 drop 2 (two) times daily.      . hyaluronate sodium (RADIAPLEXRX) GEL Apply 1 application topically 2 (two) times daily. Apply to affected skin area after rad tx and bedtime  daily      . MELATONIN PO Take 2.5 mg by mouth. 1/2 tablet once a day      . metFORMIN (GLUCOPHAGE) 500 MG tablet Take 500 mg by mouth 2 (two) times daily with a meal. 3 tablets in am, 2 tablets in evening      . Multiple Vitamin (MULTI-VITAMIN DAILY PO) Take by mouth.      Marland Kitchen omeprazole (PRILOSEC) 20 MG capsule Take 20 mg by mouth daily.      . simvastatin (ZOCOR) 20 MG tablet Take 20 mg by mouth daily at 6 PM.       . telmisartan-hydrochlorothiazide (MICARDIS HCT) 40-12.5 MG per tablet Take 1 tablet by mouth daily.      . vitamin B-12 (CYANOCOBALAMIN) 100 MCG tablet Take 100 mcg by mouth daily.      Marland Kitchen VITAMIN D, ERGOCALCIFEROL, PO Take by mouth.       No current facility-administered medications for this encounter.     ALLERGIES: Codeine and Sulfa antibiotics   LABORATORY DATA:  Lab Results  Component Value Date   WBC 7.6 07/22/2013   HGB 12.1 07/22/2013   HCT 36.6 07/22/2013   MCV 84.1 07/22/2013   PLT 220 07/22/2013   Lab Results    Component Value Date   NA 143 07/22/2013   K 3.7 07/22/2013   CO2 30* 07/22/2013   Lab Results  Component Value Date   ALT 12 07/22/2013   AST 15 07/22/2013   ALKPHOS 72 07/22/2013   BILITOT 0.42 07/22/2013     NARRATIVE: Laurie Fox was seen today for weekly treatment management. The chart was checked and the patient's films were reviewed. The patient is doing well in her first week of treatment. No difficulty so far. She is using skin cream daily.  PHYSICAL EXAMINATION: weight is 208 lb 1.6 oz (94.394 kg). Her oral temperature is 98.1 F (36.7 C). Her blood pressure is 151/61 and her pulse is 60. Her respiration is 20.        ASSESSMENT: The patient is doing satisfactorily with treatment.  PLAN: We will continue with the patient's radiation treatment as planned.

## 2013-09-18 NOTE — Progress Notes (Signed)
Weekly rad tx rt breast ,4 txs complete,  erythema only,skin intact, radiaplex  used bid, feels better over her cold, eating better  No apin 11:32 AM

## 2013-09-21 ENCOUNTER — Ambulatory Visit
Admission: RE | Admit: 2013-09-21 | Discharge: 2013-09-21 | Disposition: A | Discharge status: Home / Self Care | Payer: Medicare Other | Source: Ambulatory Visit | Attending: Radiation Oncology | Admitting: Radiation Oncology

## 2013-09-22 ENCOUNTER — Ambulatory Visit
Admission: RE | Admit: 2013-09-22 | Discharge: 2013-09-22 | Disposition: A | Discharge status: Home / Self Care | Payer: Medicare Other | Source: Ambulatory Visit | Attending: Radiation Oncology | Admitting: Radiation Oncology

## 2013-09-23 ENCOUNTER — Ambulatory Visit
Admission: RE | Admit: 2013-09-23 | Discharge: 2013-09-23 | Disposition: A | Discharge status: Home / Self Care | Payer: Medicare Other | Source: Ambulatory Visit | Attending: Radiation Oncology | Admitting: Radiation Oncology

## 2013-09-24 ENCOUNTER — Ambulatory Visit
Admission: RE | Admit: 2013-09-24 | Discharge: 2013-09-24 | Disposition: A | Discharge status: Home / Self Care | Payer: Medicare Other | Source: Ambulatory Visit | Attending: Radiation Oncology | Admitting: Radiation Oncology

## 2013-09-25 ENCOUNTER — Telehealth: Payer: Self-pay | Admitting: *Deleted

## 2013-09-25 ENCOUNTER — Ambulatory Visit
Admission: RE | Admit: 2013-09-25 | Discharge: 2013-09-25 | Disposition: A | Discharge status: Home / Self Care | Payer: Medicare Other | Source: Ambulatory Visit | Attending: Radiation Oncology | Admitting: Radiation Oncology

## 2013-09-25 ENCOUNTER — Encounter: Payer: Self-pay | Admitting: Radiation Oncology

## 2013-09-25 ENCOUNTER — Encounter: Payer: Self-pay | Admitting: Oncology

## 2013-09-25 ENCOUNTER — Ambulatory Visit (HOSPITAL_BASED_OUTPATIENT_CLINIC_OR_DEPARTMENT_OTHER): Payer: Medicare Other | Admitting: Oncology

## 2013-09-25 VITALS — BP 170/70 | HR 65 | Temp 98.6°F | Resp 20 | Ht 62.5 in | Wt 208.7 lb

## 2013-09-25 VITALS — BP 155/60 | HR 59 | Resp 16 | Wt 208.4 lb

## 2013-09-25 DIAGNOSIS — C50211 Malignant neoplasm of upper-inner quadrant of right female breast: Secondary | ICD-10-CM

## 2013-09-25 DIAGNOSIS — Z170 Estrogen receptor positive status [ER+]: Secondary | ICD-10-CM

## 2013-09-25 DIAGNOSIS — C50219 Malignant neoplasm of upper-inner quadrant of unspecified female breast: Secondary | ICD-10-CM

## 2013-09-25 MED ORDER — ANASTROZOLE 1 MG PO TABS: 1.0000 mg | ORAL_TABLET | Freq: Every day | ORAL | Status: DC

## 2013-09-25 NOTE — Progress Notes (Signed)
   Department of Radiation Oncology  Phone:  [phone] Fax:        [phone]  Weekly Treatment Note    Name: Laurie Fox Date: 09/25/2013 MRN: [id number] DOB: 1932-05-20   Current dose: 22.5 Gy  Current fraction: 9   MEDICATIONS: Current Outpatient Prescriptions  Medication Sig Dispense Refill  . acetaminophen (TYLENOL) 325 MG tablet Take 650 mg by mouth every 6 (six) hours as needed.      Marland Kitchen amLODipine (NORVASC) 10 MG tablet Take 10 mg by mouth daily.      Marland Kitchen aspirin 81 MG tablet Take 81 mg by mouth daily.      . carboxymethylcellulose (REFRESH PLUS) 0.5 % SOLN 1 drop 3 (three) times daily as needed.      . carvedilol (COREG) 25 MG tablet Take 25 mg by mouth 2 (two) times daily with a meal.      . cycloSPORINE (RESTASIS) 0.05 % ophthalmic emulsion 1 drop 2 (two) times daily.      . hyaluronate sodium (RADIAPLEXRX) GEL Apply 1 application topically 2 (two) times daily. Apply to affected skin area after rad tx and bedtime  daily      . MELATONIN PO Take 2.5 mg by mouth. 1/2 tablet once a day      . metFORMIN (GLUCOPHAGE) 500 MG tablet Take 500 mg by mouth 2 (two) times daily with a meal. 3 tablets in am, 2 tablets in evening      . Multiple Vitamin (MULTI-VITAMIN DAILY PO) Take by mouth.      Marland Kitchen omeprazole (PRILOSEC) 20 MG capsule Take 20 mg by mouth daily.      . simvastatin (ZOCOR) 20 MG tablet Take 20 mg by mouth daily at 6 PM.       . telmisartan-hydrochlorothiazide (MICARDIS HCT) 40-12.5 MG per tablet Take 1 tablet by mouth daily.      . vitamin B-12 (CYANOCOBALAMIN) 100 MCG tablet Take 100 mcg by mouth daily.      Marland Kitchen VITAMIN D, ERGOCALCIFEROL, PO Take by mouth.      Marland Kitchen anastrozole (ARIMIDEX) 1 MG tablet Take 1 tablet (1 mg total) by mouth daily.  90 tablet  12   No current facility-administered medications for this encounter.     ALLERGIES: Codeine and Sulfa antibiotics   LABORATORY DATA:  Lab Results  Component Value Date   WBC 7.6 07/22/2013   HGB 12.1  07/22/2013   HCT 36.6 07/22/2013   MCV 84.1 07/22/2013   PLT 220 07/22/2013   Lab Results  Component Value Date   NA 143 07/22/2013   K 3.7 07/22/2013   CO2 30* 07/22/2013   Lab Results  Component Value Date   ALT 12 07/22/2013   AST 15 07/22/2013   ALKPHOS 72 07/22/2013   BILITOT 0.42 07/22/2013     NARRATIVE: Laurie Fox was seen today for weekly treatment management. The chart was checked and the patient's films were reviewed.  The patient is doing well with treatment. Some mild skin changes/irritation over the last week. Some manageable fatigue. No other complaints.  PHYSICAL EXAMINATION: weight is 208 lb 6.4 oz (94.53 kg). Her blood pressure is 155/60 and her pulse is 59. Her respiration is 16.      some emerging radiation change in the treatment area. No desquamation.  ASSESSMENT: The patient is doing satisfactorily with treatment.  PLAN: We will continue with the patient's radiation treatment as planned.

## 2013-09-25 NOTE — Progress Notes (Signed)
Mild hyperpigmentation of right/treated breast without breakdown or desquamation. Reports using radiaplex bid as directed. Reports mild manageable fatigue. Denies other complaints.

## 2013-09-25 NOTE — Patient Instructions (Signed)
Anastrozole tablets What is this medicine? ANASTROZOLE (an AS troe zole) is used to treat breast cancer in women who have gone through menopause. Some types of breast cancer depend on estrogen to grow, and this medicine can stop tumor growth by blocking estrogen production. This medicine may be used for other purposes; ask your health care provider or pharmacist if you have questions. COMMON BRAND NAME(S): Arimidex What should I tell my health care provider before I take this medicine? They need to know if you have any of these conditions: -liver disease -an unusual or allergic reaction to anastrozole, other medicines, foods, dyes, or preservatives -pregnant or trying to get pregnant -breast-feeding How should I use this medicine? Take this medicine by mouth with a glass of water. Follow the directions on the prescription label. You can take this medicine with or without food. Take your doses at regular intervals. Do not take your medicine more often than directed. Do not stop taking except on the advice of your doctor or health care professional. Talk to your pediatrician regarding the use of this medicine in children. Special care may be needed. Overdosage: If you think you have taken too much of this medicine contact a poison control center or emergency room at once. NOTE: This medicine is only for you. Do not share this medicine with others. What if I miss a dose? If you miss a dose, take it as soon as you can. If it is almost time for your next dose, take only that dose. Do not take double or extra doses. What may interact with this medicine? Do not take this medicine with any of the following medications: -female hormones, like estrogens or progestins and birth control pills This medicine may also interact with the following medications: -tamoxifen This list may not describe all possible interactions. Give your health care provider a list of all the medicines, herbs, non-prescription  drugs, or dietary supplements you use. Also tell them if you smoke, drink alcohol, or use illegal drugs. Some items may interact with your medicine. What should I watch for while using this medicine? Visit your doctor or health care professional for regular checks on your progress. Let your doctor or health care professional know about any unusual vaginal bleeding. Do not treat yourself for diarrhea, nausea, vomiting or other side effects. Ask your doctor or health care professional for advice. What side effects may I notice from receiving this medicine? Side effects that you should report to your doctor or health care professional as soon as possible: -allergic reactions like skin rash, itching or hives, swelling of the face, lips, or tongue -any new or unusual symptoms -breathing problems -chest pain -leg pain or swelling -vomiting Side effects that usually do not require medical attention (report to your doctor or health care professional if they continue or are bothersome): -back or bone pain -cough, or throat infection -diarrhea or constipation -dizziness -headache -hot flashes -loss of appetite -nausea -sweating -weakness and tiredness -weight gain This list may not describe all possible side effects. Call your doctor for medical advice about side effects. You may report side effects to FDA at 1-800-FDA-1088. Where should I keep my medicine? Keep out of the reach of children. Store at room temperature between 20 and 25 degrees C (68 and 77 degrees F). Throw away any unused medicine after the expiration date. NOTE: This sheet is a summary. It may not cover all possible information. If you have questions about this medicine, talk to your doctor, pharmacist,   or health care provider.  2014, [employer]vier/Gold Standard. (2007-10-03 16:31:52)  Osteoporosis Throughout your life, your body breaks down old bone and replaces it with new bone. As you get older, your body does not replace bone  as quickly as it breaks it down. By the age of 90 years, most people begin to gradually lose bone because of the imbalance between bone loss and replacement. Some people lose more bone than others. Bone loss beyond a specified normal degree is considered osteoporosis.  Osteoporosis affects the strength and durability of your bones. The inside of the ends of your bones and your flat bones, like the bones of your pelvis, look like honeycomb, filled with tiny open spaces. As bone loss occurs, your bones become less dense. This means that the open spaces inside your bones become bigger and the walls between these spaces become thinner. This makes your bones weaker. Bones of a person with osteoporosis can become so weak that they can break (fracture) during minor accidents, such as a simple fall. CAUSES  The following factors have been associated with the development of osteoporosis:  Smoking.  Drinking more than 2 alcoholic drinks several days per week.  Long-term use of certain medicines:  Corticosteroids.  Chemotherapy medicines.  Thyroid medicines.  Antiepileptic medicines.  Gonadal hormone suppression medicine.  Immunosuppression medicine.  Being underweight.  Lack of physical activity.  Lack of exposure to the sun. This can lead to vitamin D deficiency.  Certain medical conditions:  Certain inflammatory bowel diseases, such as Crohn disease and ulcerative colitis.  Diabetes.  Hyperthyroidism.  Hyperparathyroidism. RISK FACTORS Anyone can develop osteoporosis. However, the following factors can increase your risk of developing osteoporosis:  Gender Women are at higher risk than men.  Age Being older than 17 years increases your risk.  Ethnicity White and Asian people have an increased risk.  Weight Being extremely underweight can increase your risk of osteoporosis.  Family history of osteoporosis Having a family member who has developed osteoporosis can increase your  risk. SYMPTOMS  Usually, people with osteoporosis have no symptoms.  DIAGNOSIS  Signs during a physical exam that may prompt your caregiver to suspect osteoporosis include:  Decreased height. This is usually caused by the compression of the bones that form your spine (vertebrae) because they have weakened and become fractured.  A curving or rounding of the upper back (kyphosis). To confirm signs of osteoporosis, your caregiver may request a procedure that uses 2 low-dose X-ray beams with different levels of energy to measure your bone mineral density (dual-energy X-ray absorptiometry [DXA]). Also, your caregiver may check your level of vitamin D. TREATMENT  The goal of osteoporosis treatment is to strengthen bones in order to decrease the risk of bone fractures. There are different types of medicines available to help achieve this goal. Some of these medicines work by slowing the processes of bone loss. Some medicines work by increasing bone density. Treatment also involves making sure that your levels of calcium and vitamin D are adequate. PREVENTION  There are things you can do to help prevent osteoporosis. Adequate intake of calcium and vitamin D can help you achieve optimal bone mineral density. Regular exercise can also help, especially resistance and weight-bearing activities. If you smoke, quitting smoking is an important part of osteoporosis prevention. MAKE SURE YOU:  Understand these instructions.  Will watch your condition.  Will get help right away if you are not doing well or get worse. FOR MORE INFORMATION www.osteo.org and [employer].com.ee Document Released: 05/02/2005  Document Revised: 11/17/2012 Document Reviewed: 07/07/2011 [facility] Patient Information 2014 [place].

## 2013-09-25 NOTE — Telephone Encounter (Signed)
Lm gv appt for 01/29/14 w/ labs@ 1:30p and ov@ 2pm. Made the pt aware that i will mail a letter/avs...td

## 2013-09-25 NOTE — Progress Notes (Signed)
OFFICE PROGRESS NOTE  CC**  Laurie Shelling, MD [address], [address] [place] [address] Kyung Rudd  Dr. Stark Klein  DIAGNOSIS: 80 year old female with invasive lobular carcinoma and in situ of the right breast diagnosed December 2014   PRIOR THERAPY:  #1Patient had an annual screening mammogram. In her right breast she was noted to have a mass at the 1:00 position. By ultrasound it measured 7 mm. MRI reveals 1.3 cm area postbiopsy changes. Core needle biopsy of the mass at 1:00 position revealed a grade 1/2 invasive ductal carcinoma/DCIS. Tumor was ER positive PR positive HER-2/neu negative with a proliferation marker Ki-67 30%  #2 patient is status post right lumpectomy with the final pathology revealing a 1.1 cm invasive lobular and in situ carcinoma that is strongly estrogen receptor and progesterone receptor positive HER-2/neu negative. 2 sentinel nodes were negative for metastatic disease. Proliferation marker Ki-67 25%.  CURRENT THERAPY: continuing adjuvant radiation therapy consultation  INTERVAL HISTORY: Laurie Fox 80 y.o. female returns for followup visit.patient began radiation about 2 weeks ago. She will not finish radiation until March 2015. Patient  And I discussed role of antiestrogen therapy after completion of radiation. We discussed side effects risk benefits and rationale. She understands that the therapy will be for total of 5 years.  Today she is denying any fevers chills night sweats. She has no headaches double vision blurring of vision. No nausea vomiting. She does have some hot flashes off-and-on. Remainder of the 10 point review of systems is negative.  MEDICAL HISTORY: Past Medical History  Diagnosis Date  . Hypertension   . Wears glasses   . GERD (gastroesophageal reflux disease)   . Breast cancer 07/15/13    right breast   . Diabetes mellitus without complication     type II  . Allergy   . Arthritis     knees, osteo,  shoulders,fingers    ALLERGIES:  is allergic to codeine and sulfa antibiotics.  MEDICATIONS:  Current Outpatient Prescriptions  Medication Sig Dispense Refill  . acetaminophen (TYLENOL) 325 MG tablet Take 650 mg by mouth every 6 (six) hours as needed.      Marland Kitchen amLODipine (NORVASC) 10 MG tablet Take 10 mg by mouth daily.      Marland Kitchen aspirin 81 MG tablet Take 81 mg by mouth daily.      . carboxymethylcellulose (REFRESH PLUS) 0.5 % SOLN 1 drop 3 (three) times daily as needed.      . carvedilol (COREG) 25 MG tablet Take 25 mg by mouth 2 (two) times daily with a meal.      . cycloSPORINE (RESTASIS) 0.05 % ophthalmic emulsion 1 drop 2 (two) times daily.      . hyaluronate sodium (RADIAPLEXRX) GEL Apply 1 application topically 2 (two) times daily. Apply to affected skin area after rad tx and bedtime  daily      . MELATONIN PO Take 2.5 mg by mouth. 1/2 tablet once a day      . metFORMIN (GLUCOPHAGE) 500 MG tablet Take 500 mg by mouth 2 (two) times daily with a meal. 3 tablets in am, 2 tablets in evening      . Multiple Vitamin (MULTI-VITAMIN DAILY PO) Take by mouth.      Marland Kitchen omeprazole (PRILOSEC) 20 MG capsule Take 20 mg by mouth daily.      . simvastatin (ZOCOR) 20 MG tablet Take 20 mg by mouth daily at 6 PM.       . telmisartan-hydrochlorothiazide (MICARDIS  HCT) 40-12.5 MG per tablet Take 1 tablet by mouth daily.      . vitamin B-12 (CYANOCOBALAMIN) 100 MCG tablet Take 100 mcg by mouth daily.      Marland Kitchen VITAMIN D, ERGOCALCIFEROL, PO Take by mouth.       No current facility-administered medications for this visit.    SURGICAL HISTORY:  Past Surgical History  Procedure Laterality Date  . Knee surgery  2956,2130    lt and rt knee scopes  . Tonsillectomy    . Dilation and curettage of uterus    . Fracture surgery  2006    lt foot  . Colonoscopy    . Breast lumpectomy with needle localization and axillary sentinel lymph node bx Right 08/11/2013    Procedure: RIGHT BREAST LUMPECTOMY WITH NEEDLE  LOCALIZATION AND AXILLARY SENTINEL LYMPH NODE BIOPSY;  Surgeon: Stark Klein, MD;  Location: [facility];  Service: General;  Laterality: Right;  . Breast biopsy Right 07/15/13    bx=mass 1 0'clock, invasive mammary ca, mammary ca in situ    REVIEW OF SYSTEMS:  Pertinent items are noted in HPI.   HEALTH MAINTENANCE:  PHYSICAL EXAMINATION: Blood pressure 170/70, pulse 65, temperature 98.6 F (37 C), temperature source Oral, resp. rate 20, height 5' 2.5" (1.588 m), weight 208 lb 11.2 oz (94.666 kg). Body mass index is 37.54 kg/(m^2). ECOG PERFORMANCE STATUS: 0 - Asymptomatic   General appearance: alert, cooperative and appears stated age Neck: no adenopathy, no carotid bruit, no JVD, supple, symmetrical, trachea midline and thyroid not enlarged, symmetric, no tenderness/mass/nodules Lymph nodes: Cervical, supraclavicular, and axillary nodes normal. Resp: clear to auscultation bilaterally Back: symmetric, no curvature. ROM normal. No CVA tenderness. Cardio: regular rate and rhythm GI: soft, non-tender; bowel sounds normal; no masses,  no organomegaly Extremities: extremities normal, atraumatic, no cyanosis or edema Neurologic: Grossly normal Breast examination: Right breast with well healed curvilinear surgical scar and sentinel lymph node biopsy scar. No infections. Left breast no masses or nipple discharge.  LABORATORY DATA: Lab Results  Component Value Date   WBC 7.6 07/22/2013   HGB 12.1 07/22/2013   HCT 36.6 07/22/2013   MCV 84.1 07/22/2013   PLT 220 07/22/2013      Chemistry      Component Value Date/Time   NA 143 07/22/2013 1206   K 3.7 07/22/2013 1206   CO2 30* 07/22/2013 1206   BUN 17.7 07/22/2013 1206   CREATININE 0.8 07/22/2013 1206      Component Value Date/Time   CALCIUM 10.7* 07/22/2013 1206   ALKPHOS 72 07/22/2013 1206   AST 15 07/22/2013 1206   ALT 12 07/22/2013 1206   BILITOT 0.42 07/22/2013 1206    Diagnosis 1. Breast, lumpectomy,  Right - INVASIVE GRADE II LOBULAR CARCINOMA SPANNING 1.1 CM IN GREATEST DIMENSION. - ASSOCIATED LOBULAR CARCINOMA IN SITU. - MARGINS ARE NEGATIVE. - SEE ONCOLOGY TEMPLATE. 2. Lymph node, sentinel, biopsy, Right axillary #1 - ONE BENIGN LYMPH NODE WITH NO TUMOR SEEN (0/1). - SEE COMMENT. 3. Lymph node, sentinel, biopsy, Right axillary #2 - ONE BENIGN LYMPH NODE WITH NO TUMOR SEEN (0/1). - SEE COMMENT. Microscopic Comment 1. BREAST, INVASIVE TUMOR, WITH LYMPH NODE SAMPLING Specimen, including laterality and lymph node sampling (sentinel, non-sentinel): Right partial breast with right sentinel lymph node sampling. Procedure: Right breast lumpectomy with right sentinel lymph node biopsies. Histologic type: Invasive lobular carcinoma. Grade: II. Tubule formation: 3. Nuclear pleomorphism: 2. Mitotic: 2. Tumor size (gross measurement and glass slide measurement): 1.1 cm in  greatest dimension. Margins: Invasive, distance to closest margin: 0.2 cm (posterior margin). Lymphovascular invasion: Definitive lymphovascular invasion is not identified. Ductal carcinoma in situ: No. Lobular neoplasia: Yes, lobular carcinoma in situ present. Tumor focality: Unifocal. Treatment effect: N/A. Extent of tumor: Tumor confined to breast parenchyma. Skin: Not received. Nipple: Not received. 1 of 3 FINAL for Laurie Fox, Laurie Fox ([id number]) Microscopic Comment(continued) Skeletal muscle: Not received. Lymph nodes: Examined: 2 Sentinel. 0 Non-sentinel. 2 Total. Lymph nodes with metastasis: 0. Breast prognostic profile: Performed on previous case [id number] Estrogen receptor: 100%, positive. Progesterone receptor: 100%, positive. Her 2 neu: 1.40 ratio, no amplification . Ki-67: 25%. Non-neoplastic breast: Fat necrosis present. TNM: pT1c, pN0, MX. Comments: An E-Cadherin immunohistochemical stain is performed on a representative block of tumor. It is negative in both the in situ and invasive  components confirming the lobular nature of the tumor. As there was previously no amplification of Her-2 neu by [facility] this will be repeated on the current tumor and reported in an addendum. 2. , 3. The cytokeratin AE1/AE3 immunostain is performed on both lymph nodes (two stains total). The stains are negative confirming the lack of metastatic carcinoma. (RH:kh 08-13-13)   RADIOGRAPHIC STUDIES:  Nm Sentinel Node Inj-no Rpt (breast)  08/11/2013   CLINICAL DATA: right breast cancer   Sulfur colloid was injected intradermally by the nuclear medicine  technologist for breast cancer sentinel node localization.    [place] Rt Plc Breast Loc Dev   1st Lesion  Inc US Guide  08/11/2013   CLINICAL DATA:  Known right breast carcinoma. Preoperative needle localization.  EXAM: ULTRASOUND NEEDLE LOCALIZATION OF THE right BREAST  COMPARISON:  Previous exams.  FINDINGS: Patient presents for needle localization prior to surgical excision of right breast carcinoma. I met with the patient and we discussed the procedure of needle localization including benefits and alternatives. We discussed the high likelihood of a successful procedure. We discussed the risks of the procedure, including infection, bleeding, tissue injury, and further surgery. Informed, written consent was given.  The usual time-out protocol was performed immediately prior to the procedure.  Using ultrasound guidance, sterile technique, 2% lidocaine and a 9 cm ultra wire, the mass with adjacent clip waslocalized using a medial approach. The films are marked for Dr. Barry Dienes.  Specimen radiograph is performed at surgery and confirms the mass, clip, and the intact wire to be present in the tissue sample. The specimen is marked for pathology.  IMPRESSION: Needle localization of the mass located within the right breast as discussed above. No apparent complications.   Electronically Signed   By: Luberta Robertson M.D.   On: 08/11/2013 16:00    ASSESSMENT: 80 year old female  with new diagnosis of invasive lobular and in situ carcinoma of the right breast originally diagnosed in December 2014. Patient was seen in the multidisciplinary breast clinic at that time. Her tumor was estrogen receptor +100% progesterone receptor +100% HER-2/neu negative with a proliferation marker Ki-67 25%. She is now status post right lumpectomy with sentinel lymph node biopsy. Final pathology did reveal 1.1 cm invasive lobular carcinoma 2 sentinel nodes were negative for metastatic disease ER/PR positive HER-2/neu negative. Postoperatively patient continues to do well. She is seen now to discuss adjuvant treatment options. Patient and I discussed adjuvant antiestrogen therapy. I think overall she would be a good candidate for this. In my clinical opinion she does not need adjuvant chemotherapy due to low risk of having recurrence. Because patient has had a lumpectomy certainly she would be a  good candidate for adjuvant radiation therapy. She was seen by Dr. Kyung Rudd in the [facility]. She has an upcoming appointment with him. In the meantime I have discussed different antiestrogen therapies with her. This would certainly include tamoxifen versus aromatase inhibitors. We discussed side effects of tamoxifen as well as aromatase inhibitors. We discussed endometrial cancer and risk of blood clots with tamoxifen as well as strokes. We discussed osteopenia/osteoporosis and myalgias or arthralgias etc. for a use. I gave her more information on Arimidex. This will not begin until after radiation therapy is completed.   PLAN:  #1 patient is continuing radiation therapy. She will complete all of her therapy on 10/12/2013.  #2 I discussed adjuvant antiestrogen therapy with her. My plan is to start her on anastrozole 1 mg daily after she completes radiation therapy. I have given her information on anastrozole. Also a prescription to her to be filled by her pharmacy.she will begin this as soon as she completes her  radiation in March.  #3 patient will be seen back in June 2015.  All questions were answered. The patient knows to call the clinic with any problems, questions or concerns. We can certainly see the patient much sooner if necessary.  I spent 20 minutes counseling the patient face to face. The total time spent in the appointment was 30 minutes.    Marcy Panning, MD Medical/Oncology [facility] [phone] (beeper) [phone] (Office)  09/25/2013, 2:59 PM

## 2013-09-27 ENCOUNTER — Encounter: Payer: Self-pay | Admitting: Oncology

## 2013-09-28 ENCOUNTER — Ambulatory Visit
Admission: RE | Admit: 2013-09-28 | Discharge: 2013-09-28 | Disposition: A | Discharge status: Home / Self Care | Payer: Medicare Other | Source: Ambulatory Visit | Attending: Radiation Oncology | Admitting: Radiation Oncology

## 2013-09-29 ENCOUNTER — Ambulatory Visit
Admission: RE | Admit: 2013-09-29 | Discharge: 2013-09-29 | Disposition: A | Discharge status: Home / Self Care | Payer: Medicare Other | Source: Ambulatory Visit | Attending: Radiation Oncology | Admitting: Radiation Oncology

## 2013-09-30 ENCOUNTER — Ambulatory Visit
Admission: RE | Admit: 2013-09-30 | Discharge: 2013-09-30 | Disposition: A | Discharge status: Home / Self Care | Payer: Medicare Other | Source: Ambulatory Visit | Attending: Radiation Oncology | Admitting: Radiation Oncology

## 2013-10-01 ENCOUNTER — Ambulatory Visit
Admission: RE | Admit: 2013-10-01 | Discharge: 2013-10-01 | Disposition: A | Discharge status: Home / Self Care | Payer: Medicare Other | Source: Ambulatory Visit | Attending: Radiation Oncology | Admitting: Radiation Oncology

## 2013-10-02 ENCOUNTER — Ambulatory Visit
Admission: RE | Admit: 2013-10-02 | Discharge: 2013-10-02 | Disposition: A | Discharge status: Home / Self Care | Payer: Medicare Other | Source: Ambulatory Visit | Attending: Radiation Oncology | Admitting: Radiation Oncology

## 2013-10-02 VITALS — BP 147/56 | HR 57 | Temp 98.4°F | Ht 62.5 in | Wt 209.8 lb

## 2013-10-02 DIAGNOSIS — C50211 Malignant neoplasm of upper-inner quadrant of right female breast: Secondary | ICD-10-CM

## 2013-10-02 NOTE — Progress Notes (Signed)
Laurie Fox has had 14 fractions to her right breast.  She denies pain and says her fatigue has improved this week.  The skin on her right breast and underarm are pink and intact.  She is using radiaplex twice a day.

## 2013-10-03 ENCOUNTER — Encounter: Payer: Self-pay | Admitting: Radiation Oncology

## 2013-10-03 NOTE — Progress Notes (Signed)
  Radiation Oncology         [phone] ________________________________  Name: Laurie Fox MRN: [id number]  Date: 10/02/2013  DOB: 1932-05-26  Weekly Radiation Therapy Management  Current Dose: 35 Gy     Planned Dose:  42.5 Gy  Narrative . . . . . . . . The patient presents for routine under treatment assessment.                                   Laurie Fox has had 14 fractions to her right breast. She denies pain and says her fatigue has improved this week. The skin on her right breast and underarm are pink and intact. She is using radiaplex twice a day The patient is without complaint.                                 Set-up films were reviewed.                                 The chart was checked. Physical Findings. . .  height is 5' 2.5" (1.588 m) and weight is 209 lb 12.8 oz (95.165 kg). Her temperature is 98.4 F (36.9 C). Her blood pressure is 147/56 and her pulse is 57. . Weight essentially stable.  No significant changes. Impression . . . . . . . The patient is tolerating radiation. Plan . . . . . . . . . . . . Continue treatment as planned.  ________________________________  Sheral Apley. Tammi Klippel, M.D.

## 2013-10-05 ENCOUNTER — Ambulatory Visit
Admission: RE | Admit: 2013-10-05 | Discharge: 2013-10-05 | Disposition: A | Discharge status: Home / Self Care | Payer: Medicare Other | Source: Ambulatory Visit | Attending: Radiation Oncology | Admitting: Radiation Oncology

## 2013-10-05 ENCOUNTER — Ambulatory Visit: Payer: Medicare Other | Admitting: Radiation Oncology

## 2013-10-06 ENCOUNTER — Ambulatory Visit
Admission: RE | Admit: 2013-10-06 | Discharge: 2013-10-06 | Disposition: A | Discharge status: Home / Self Care | Payer: Medicare Other | Source: Ambulatory Visit | Attending: Radiation Oncology | Admitting: Radiation Oncology

## 2013-10-06 DIAGNOSIS — C50211 Malignant neoplasm of upper-inner quadrant of right female breast: Secondary | ICD-10-CM

## 2013-10-06 MED ORDER — RADIAPLEXRX EX GEL: Freq: Once | CUTANEOUS | Status: DC

## 2013-10-07 ENCOUNTER — Ambulatory Visit
Admission: RE | Admit: 2013-10-07 | Discharge: 2013-10-07 | Disposition: A | Discharge status: Home / Self Care | Payer: Medicare Other | Source: Ambulatory Visit | Attending: Radiation Oncology | Admitting: Radiation Oncology

## 2013-10-07 MED ORDER — RADIAPLEXRX EX GEL
Freq: Once | CUTANEOUS | Status: AC
  Administered 2013-10-07: 08:00:00 via TOPICAL

## 2013-10-08 ENCOUNTER — Ambulatory Visit
Admission: RE | Admit: 2013-10-08 | Discharge: 2013-10-08 | Disposition: A | Discharge status: Home / Self Care | Payer: Medicare Other | Source: Ambulatory Visit | Attending: Radiation Oncology | Admitting: Radiation Oncology

## 2013-10-08 DIAGNOSIS — C50211 Malignant neoplasm of upper-inner quadrant of right female breast: Secondary | ICD-10-CM

## 2013-10-08 NOTE — Progress Notes (Signed)
  Radiation Oncology         [phone] ________________________________  Name: Laurie Fox MRN: [id number]  Date: 10/08/2013  DOB: 1931-09-11  Simulation Verification Note   NARRATIVE: The patient was brought to the treatment unit and placed in the planned treatment position for the patient's boost treatment. The clinical setup was verified. Then port films were obtained and uploaded to the radiation oncology medical record software.  The treatment beams were carefully compared against the planned radiation fields. The position, location, and shape of the radiation fields was reviewed. The targeted volume of tissue appears to be appropriately covered by the radiation beams. Based on my personal review, I approved the simulation verification. The patient's treatment will proceed as planned.  ________________________________   Jodelle Gross, MD, PhD

## 2013-10-09 ENCOUNTER — Ambulatory Visit
Admission: RE | Admit: 2013-10-09 | Discharge: 2013-10-09 | Disposition: A | Discharge status: Home / Self Care | Payer: Medicare Other | Source: Ambulatory Visit | Attending: Radiation Oncology | Admitting: Radiation Oncology

## 2013-10-09 ENCOUNTER — Encounter: Payer: Self-pay | Admitting: Radiation Oncology

## 2013-10-09 VITALS — BP 134/76 | HR 51 | Temp 98.2°F | Resp 20 | Wt 209.3 lb

## 2013-10-09 DIAGNOSIS — C50211 Malignant neoplasm of upper-inner quadrant of right female breast: Secondary | ICD-10-CM

## 2013-10-09 NOTE — Progress Notes (Signed)
Pt denies pain, loss of appetite. She states she is fatigued on some days. Pt applying Radiaplex to right breast for hyperpigmentation. Pt will complete treatment on Monday, gave her 1 month FU card.

## 2013-10-09 NOTE — Progress Notes (Signed)
Department of Radiation Oncology  Phone:  [phone] Fax:        [phone]  Weekly Treatment Note    Name: Laurie Fox Date: 10/09/2013 MRN: [id number] DOB: [birth year]   Current dose: 47.5 Gy  Current fraction: 19   MEDICATIONS: Current Outpatient Prescriptions  Medication Sig Dispense Refill  . acetaminophen (TYLENOL) 325 MG tablet Take 650 mg by mouth every 6 (six) hours as needed.      Marland Kitchen amLODipine (NORVASC) 10 MG tablet Take 10 mg by mouth daily.      Marland Kitchen anastrozole (ARIMIDEX) 1 MG tablet Take 1 tablet (1 mg total) by mouth daily.  90 tablet  12  . aspirin 81 MG tablet Take 81 mg by mouth daily.      Marland Kitchen bismuth subsalicylate (PEPTO BISMOL) 262 MG chewable tablet Chew 524 mg by mouth as needed.      . carboxymethylcellulose (REFRESH PLUS) 0.5 % SOLN 1 drop 3 (three) times daily as needed.      . carvedilol (COREG) 25 MG tablet Take 25 mg by mouth 2 (two) times daily with a meal.      . cycloSPORINE (RESTASIS) 0.05 % ophthalmic emulsion 1 drop 2 (two) times daily.      . hyaluronate sodium (RADIAPLEXRX) GEL Apply 1 application topically 2 (two) times daily. Apply to affected skin area after rad tx and bedtime  daily      . MELATONIN PO Take 2.5 mg by mouth. 1/2 tablet once a day      . metFORMIN (GLUCOPHAGE) 500 MG tablet Take 500 mg by mouth 2 (two) times daily with a meal. 3 tablets in am, 2 tablets in evening      . Multiple Vitamin (MULTI-VITAMIN DAILY PO) Take by mouth.      Marland Kitchen omeprazole (PRILOSEC) 20 MG capsule Take 20 mg by mouth daily.      . simvastatin (ZOCOR) 20 MG tablet Take 20 mg by mouth daily at 6 PM.       . telmisartan-hydrochlorothiazide (MICARDIS HCT) 40-12.5 MG per tablet Take 1 tablet by mouth daily.      . vitamin B-12 (CYANOCOBALAMIN) 100 MCG tablet Take 100 mcg by mouth daily.      Marland Kitchen VITAMIN D, ERGOCALCIFEROL, PO Take by mouth.       No current facility-administered medications for this encounter.     ALLERGIES: Codeine and Sulfa  antibiotics   LABORATORY DATA:  Lab Results  Component Value Date   WBC 7.6 07/22/2013   HGB 12.1 07/22/2013   HCT 36.6 07/22/2013   MCV 84.1 07/22/2013   PLT 220 07/22/2013   Lab Results  Component Value Date   NA 143 07/22/2013   K 3.7 07/22/2013   CO2 30* 07/22/2013   Lab Results  Component Value Date   ALT 12 07/22/2013   AST 15 07/22/2013   ALKPHOS 72 07/22/2013   BILITOT 0.42 07/22/2013     NARRATIVE: Laurie Fox was seen today for weekly treatment management. The chart was checked and the patient's films were reviewed. The patient states she is doing well. She continues to use skin cream daily. Some fatigue.  PHYSICAL EXAMINATION: weight is 209 lb 4.8 oz (94.938 kg). Her oral temperature is 98.2 F (36.8 C). Her blood pressure is 134/76 and her pulse is 51. Her respiration is 20.      the patient's skin shows some moderate irritative signs consistent with radiation change. No desquamation.  ASSESSMENT: The patient is doing  satisfactorily with treatment.  PLAN: We will continue with the patient's radiation treatment as planned. The patient's skin looks are good for where she is in her treatment. She will followup in one month.

## 2013-10-12 ENCOUNTER — Ambulatory Visit
Admission: RE | Admit: 2013-10-12 | Discharge: 2013-10-12 | Disposition: A | Discharge status: Home / Self Care | Payer: Medicare Other | Source: Ambulatory Visit | Attending: Radiation Oncology | Admitting: Radiation Oncology

## 2013-10-12 ENCOUNTER — Encounter: Payer: Self-pay | Admitting: Radiation Oncology

## 2013-10-27 NOTE — Progress Notes (Signed)
Complex simulation note  Diagnosis: Breast cancer  Narrative The patient has initially been planned to receive a course of whole breast radiation to a dose of 42.5 gray in 17 fractions at 2.5 gray per fraction. The patient will now receive an additional boost to the seroma cavity which has been contoured. This will correspond to a boost of 7.5 gray in 3 fractions at 2.5 gray per fraction. To accomplish this, an additional 4 customized blocks have been designed for this purpose. A complex isodose plan is requested to ensure that the target area is adequately covered with radiation dose and that the nearby normal structures such as the lung are adequately spared. The patient's final total dose will be 50 gray.  ------------------------------------------------  Laurie Gross, MD, PhD

## 2013-10-27 NOTE — Progress Notes (Signed)
  Radiation Oncology         [phone]) [phone] ________________________________  Name: CARINNE BRANDENBURGER MRN: [id number]  Date: 10/12/2013  DOB: 1932/04/13  End of Treatment Note  Diagnosis:   Invasive lobular carcinoma of the right breast     Indication for treatment:  Curative       Radiation treatment dates:   09/15/2013 through 10/12/2013  Site/dose:   The patient received a course of whole breast radiation treatment using tangent fields with a reduced field/forward planning technique. This delivered 42.5 gray at 2.5 gray per fraction. The patient then received a boost to the seroma cavity for an additional 7.5 gray. This corresponded to a 4 field photon technique. The total dose was 50 gray.  Narrative: The patient tolerated radiation treatment relatively well.   She experienced some mild to moderate skin changes at the end of treatment. No significant findings difficulties or moist desquamation during treatment.  Plan: The patient has completed radiation treatment. The patient will return to radiation oncology clinic for routine followup in one month. I advised the patient to call or return sooner if they have any questions or concerns related to their recovery or treatment. ________________________________  Jodelle Gross, M.D., Ph.D.

## 2013-10-27 NOTE — Addendum Note (Signed)
Encounter addended by: Marye Round, MD on: 10/27/2013  9:10 PM<BR>     Documentation filed: Notes Section

## 2013-10-27 NOTE — Progress Notes (Signed)
  Radiation Oncology         (336) [phone] ________________________________  Name: Laurie MCDOUGALD MRN: [id number]  Date: 09/07/2013  DOB: 14-Dec-1931  Optical Surface Tracking Plan:  Since intensity modulated radiotherapy (IMRT) and 3D conformal radiation treatment methods are predicated on accurate and precise positioning for treatment, intrafraction motion monitoring is medically necessary to ensure accurate and safe treatment delivery.  The ability to quantify intrafraction motion without excessive ionizing radiation dose can only be performed with optical surface tracking. Accordingly, surface imaging offers the opportunity to obtain 3D measurements of patient position throughout IMRT and 3D treatments without excessive radiation exposure.  I am ordering optical surface tracking for this patient's upcoming course of radiotherapy. ________________________________  Marye Round, MD 10/27/2013 9:09 PM    Reference:   Ursula Alert, J, et al. Surface imaging-based analysis of intrafraction motion for breast radiotherapy patients.Journal of [place], n. 6, nov. 2014. ISSN [id number].   Available at: <http://www.jacmp.org/index.php/jacmp/article/view/4957>.

## 2013-11-13 ENCOUNTER — Encounter: Payer: Self-pay | Admitting: Radiation Oncology

## 2013-11-19 ENCOUNTER — Ambulatory Visit
Admission: RE | Admit: 2013-11-19 | Discharge: 2013-11-19 | Disposition: A | Discharge status: Home / Self Care | Payer: Medicare Other | Source: Ambulatory Visit | Attending: Radiation Oncology | Admitting: Radiation Oncology

## 2013-11-19 ENCOUNTER — Encounter: Payer: Self-pay | Admitting: Radiation Oncology

## 2013-11-19 VITALS — BP 158/56 | HR 58 | Temp 98.0°F | Resp 20 | Wt 210.6 lb

## 2013-11-19 DIAGNOSIS — C50211 Malignant neoplasm of upper-inner quadrant of right female breast: Secondary | ICD-10-CM

## 2013-11-19 HISTORY — DX: Personal history of irradiation: Z92.3

## 2013-11-19 NOTE — Progress Notes (Addendum)
Pt denies pain, loss of appetite. She states her fatigue is resolving. She states her skin of right breast has healed. She takes Arimidex 1 mg daily.

## 2013-11-19 NOTE — Progress Notes (Signed)
Radiation Oncology         [phone] ________________________________  Name: Laurie Fox MRN: [id number]  Date: 11/19/2013  DOB: 1932/05/09  Follow-Up Visit Note  CC: Irven Shelling, MD  Stark Klein, MD  Diagnosis:   Right-sided breast cancer  Interval Since Last Radiation:  Approximately one month   Narrative:  The patient returns today for routine follow-up.  She has done well overall since she finished treatment. The patient's skin has healed significantly since she completed her course of radiation treatment. She has begun anti-hormonal treatment.                              ALLERGIES:  is allergic to codeine and sulfa antibiotics.  Meds: Current Outpatient Prescriptions  Medication Sig Dispense Refill  . acetaminophen (TYLENOL) 325 MG tablet Take 650 mg by mouth every 6 (six) hours as needed.      Marland Kitchen amLODipine (NORVASC) 10 MG tablet Take 10 mg by mouth daily.      Marland Kitchen anastrozole (ARIMIDEX) 1 MG tablet Take 1 mg by mouth daily.      Marland Kitchen aspirin 81 MG tablet Take 81 mg by mouth daily.      Marland Kitchen bismuth subsalicylate (PEPTO BISMOL) 262 MG chewable tablet Chew 524 mg by mouth as needed.      . carboxymethylcellulose (REFRESH PLUS) 0.5 % SOLN 1 drop 3 (three) times daily as needed.      . carvedilol (COREG) 25 MG tablet Take 25 mg by mouth 2 (two) times daily with a meal.      . cycloSPORINE (RESTASIS) 0.05 % ophthalmic emulsion 1 drop 2 (two) times daily.      . hyaluronate sodium (RADIAPLEXRX) GEL Apply 1 application topically 2 (two) times daily. Apply to affected skin area after rad tx and bedtime  daily      . MELATONIN PO Take 2.5 mg by mouth. 1/2 tablet once a day      . metFORMIN (GLUCOPHAGE) 500 MG tablet Take 500 mg by mouth 2 (two) times daily with a meal. 3 tablets in am, 2 tablets in evening      . Multiple Vitamin (MULTI-VITAMIN DAILY PO) Take by mouth.      Marland Kitchen omeprazole (PRILOSEC) 20 MG capsule Take 20 mg by mouth daily.      . simvastatin (ZOCOR) 20 MG  tablet Take 20 mg by mouth daily at 6 PM.       . telmisartan-hydrochlorothiazide (MICARDIS HCT) 40-12.5 MG per tablet Take 1 tablet by mouth daily.      . vitamin B-12 (CYANOCOBALAMIN) 100 MCG tablet Take 100 mcg by mouth daily.      Marland Kitchen VITAMIN D, ERGOCALCIFEROL, PO Take by mouth.       No current facility-administered medications for this encounter.    Physical Findings: The patient is in no acute distress. Patient is alert and oriented.  weight is 210 lb 9.6 oz (95.528 kg). Her oral temperature is 98 F (36.7 C). Her blood pressure is 158/56 and her pulse is 58. Her respiration is 20. .   The skin in the treatment area has healed satisfactorily, no areas of concern/moist desquamation/poor healing  Lab Findings: Lab Results  Component Value Date   WBC 7.6 07/22/2013   HGB 12.1 07/22/2013   HCT 36.6 07/22/2013   MCV 84.1 07/22/2013   PLT 220 07/22/2013     Radiographic Findings: No results found.  Impression:  The patient has done satisfactorily since finishing treatment. She has begun anti-hormonal treatment.  Plan:  The patient will followup in our clinic on a when necessary basis.   Jodelle Gross, M.D., Ph.D.

## 2013-12-14 ENCOUNTER — Encounter (INDEPENDENT_AMBULATORY_CARE_PROVIDER_SITE_OTHER): Payer: Self-pay | Admitting: General Surgery

## 2013-12-14 ENCOUNTER — Ambulatory Visit (INDEPENDENT_AMBULATORY_CARE_PROVIDER_SITE_OTHER): Payer: Medicare Other | Admitting: General Surgery

## 2013-12-14 VITALS — BP 130/68 | HR 68 | Resp 24 | Ht 63.5 in | Wt 203.6 lb

## 2013-12-14 DIAGNOSIS — C50219 Malignant neoplasm of upper-inner quadrant of unspecified female breast: Secondary | ICD-10-CM

## 2013-12-14 DIAGNOSIS — C50211 Malignant neoplasm of upper-inner quadrant of right female breast: Secondary | ICD-10-CM

## 2013-12-14 NOTE — Patient Instructions (Signed)
Follow up in September 2015.

## 2013-12-15 NOTE — Assessment & Plan Note (Signed)
No clinical evidence of disease.  Continue arimedex  Follow up in September

## 2013-12-15 NOTE — Progress Notes (Signed)
HISTORY: Pt is s/p right lumpectomy and SLN bx for right breast cancer, pT1cN0M0 +/+/06 Aug 2013.  She completed radiation around 3 weeks ago.  She has had some tingling sensations like sharp pins and needles in her right breast since radiation started.  She has also had some fatigue.  She is doing well with the arimedex.  She has follow up arranged at the cancer center in June.     PERTINENT REVIEW OF SYSTEMS: Otherwise negative x 11.    Filed Vitals:   12/14/13 1609  BP: 130/68  Pulse: 68  Resp: 24   Wt Readings from Last 3 Encounters:  12/14/13 203 lb 9.6 oz (92.352 kg)  11/19/13 210 lb 9.6 oz (95.528 kg)  10/09/13 209 lb 4.8 oz (94.938 kg)    EXAM: Head: Normocephalic and atraumatic.  Eyes:  Conjunctivae are normal. Pupils are equal, round, and reactive to light. No scleral icterus.  Neck:  Normal range of motion. Neck supple. No tracheal deviation present. No thyromegaly present.  Resp: No respiratory distress, normal effort. Breast:  Right breast with some thickening as expected at the lumpectomy site. She also has some post op right sided nipple retraction.  She has no evidence of skin dimpling or nipple discharge.   Abd:  Abdomen is soft, non distended and non tender. No masses are palpable.  There is no rebound and no guarding.  Neurological: Alert and oriented to person, place, and time. Coordination normal.  Skin: Skin is warm and dry. No rash noted. No diaphoretic. No erythema. No pallor.  Psychiatric: Normal mood and affect. Normal behavior. Judgment and thought content normal.      ASSESSMENT AND PLAN:   Breast cancer of upper-inner quadrant of right female breast No clinical evidence of disease.  Continue arimedex  Follow up in September        Cristofer Yaffe L Zimri Brennen, MD Surgical Oncology, [facility]  Irven Shelling, MD Irven Shelling, MD

## 2014-01-29 ENCOUNTER — Telehealth: Payer: Self-pay | Admitting: Hematology and Oncology

## 2014-01-29 ENCOUNTER — Ambulatory Visit (HOSPITAL_BASED_OUTPATIENT_CLINIC_OR_DEPARTMENT_OTHER): Payer: Medicare Other | Admitting: Hematology and Oncology

## 2014-01-29 ENCOUNTER — Other Ambulatory Visit (HOSPITAL_BASED_OUTPATIENT_CLINIC_OR_DEPARTMENT_OTHER): Payer: Medicare Other

## 2014-01-29 VITALS — BP 182/68 | HR 65 | Temp 98.2°F | Resp 18 | Ht 63.5 in | Wt 205.7 lb

## 2014-01-29 DIAGNOSIS — C50211 Malignant neoplasm of upper-inner quadrant of right female breast: Secondary | ICD-10-CM

## 2014-01-29 DIAGNOSIS — Z923 Personal history of irradiation: Secondary | ICD-10-CM

## 2014-01-29 DIAGNOSIS — Z79811 Long term (current) use of aromatase inhibitors: Secondary | ICD-10-CM

## 2014-01-29 DIAGNOSIS — Z853 Personal history of malignant neoplasm of breast: Secondary | ICD-10-CM

## 2014-01-29 LAB — COMPREHENSIVE METABOLIC PANEL (CC13)
ALT: 9 U/L (ref 0–55)
AST: 12 U/L (ref 5–34)
Albumin: 4.2 g/dL (ref 3.5–5.0)
Alkaline Phosphatase: 70 U/L (ref 40–150)
Anion Gap: 10 mEq/L (ref 3–11)
BILIRUBIN TOTAL: 0.29 mg/dL (ref 0.20–1.20)
BUN: 22 mg/dL (ref 7.0–26.0)
CO2: 28 meq/L (ref 22–29)
CREATININE: 0.8 mg/dL (ref 0.6–1.1)
Calcium: 10.2 mg/dL (ref 8.4–10.4)
Chloride: 103 mEq/L (ref 98–109)
Glucose: 149 mg/dl — ABNORMAL HIGH (ref 70–140)
Potassium: 4.4 mEq/L (ref 3.5–5.1)
SODIUM: 141 meq/L (ref 136–145)
TOTAL PROTEIN: 6.6 g/dL (ref 6.4–8.3)

## 2014-01-29 LAB — CBC WITH DIFFERENTIAL/PLATELET
BASO%: 0.4 % (ref 0.0–2.0)
Basophils Absolute: 0 10*3/uL (ref 0.0–0.1)
EOS%: 3.9 % (ref 0.0–7.0)
Eosinophils Absolute: 0.2 10*3/uL (ref 0.0–0.5)
HEMATOCRIT: 37.7 % (ref 34.8–46.6)
HGB: 12.1 g/dL (ref 11.6–15.9)
LYMPH%: 16 % (ref 14.0–49.7)
MCH: 26.9 pg (ref 25.1–34.0)
MCHC: 32 g/dL (ref 31.5–36.0)
MCV: 84.1 fL (ref 79.5–101.0)
MONO#: 0.5 10*3/uL (ref 0.1–0.9)
MONO%: 9.4 % (ref 0.0–14.0)
NEUT#: 4.1 10*3/uL (ref 1.5–6.5)
NEUT%: 70.3 % (ref 38.4–76.8)
Platelets: 207 10*3/uL (ref 145–400)
RBC: 4.49 10*6/uL (ref 3.70–5.45)
RDW: 15.1 % — ABNORMAL HIGH (ref 11.2–14.5)
WBC: 5.8 10*3/uL (ref 3.9–10.3)
lymph#: 0.9 10*3/uL (ref 0.9–3.3)

## 2014-01-29 NOTE — Telephone Encounter (Signed)
gv pt appt schedule for oct and mammo for 9/10 @ [facility]

## 2014-01-29 NOTE — Progress Notes (Signed)
OFFICE PROGRESS NOTE    Irven Shelling, MD [address], [address] [place]  [address] [place]  Dr. Stark Klein  Chief complaint: Follow up visit for breast cancer  DIAGNOSIS:  invasive lobular carcinoma and in situ carcinoma of the right breast diagnosed December 2014   PRIOR THERAPY: From original intake note:  #1Patient had an annual screening mammogram. In her right breast she was noted to have a mass at the 1:00 position. By ultrasound it measured 7 mm. MRI reveals 1.3 cm area postbiopsy changes. Core needle biopsy of the mass at 1:00 position revealed a grade 1/2 invasive ductal carcinoma/DCIS. Tumor was ER positive PR positive HER-2/neu negative with a proliferation marker Ki-67 30%  #2 patient is status post right lumpectomy with the final pathology revealing a 1.1 cm invasive lobular and in situ carcinoma that is strongly estrogen receptor and progesterone receptor positive HER-2/neu negative. 2 sentinel nodes were negative for metastatic disease. Proliferation marker Ki-67 25%.  CURRENT THERAPY: Anastrozole 1 mg by mouth once daily started on 10/13/2013   INTERVAL HISTORY: Laurie Fox 80 y.o. female returns for followup visit for her breast cancer. She completed radiation therapy on 10/12/2013. She tolerated radiation therapy very well. He does complain of occasional sharp pains in the right breast. She complains of minimal joint pains. Denies any hot flashes, shortness of breath, chest pain, palpitations, blood in the stool or blood in the urine. She says  her  bilateral lower extremity swelling is improving.  She denies any dizziness, headaches, blurred vision ,constipation or diarrhea.      MEDICAL HISTORY: Past Medical History  Diagnosis Date  . Hypertension   . Wears glasses   . GERD (gastroesophageal reflux disease)   . Breast cancer 07/15/13    right breast   . Diabetes mellitus without complication     type II  . Allergy   .  Arthritis     knees, osteo, shoulders,fingers  . History of radiation therapy 09/15/13-10/12/13    right breast    ALLERGIES:  is allergic to codeine and sulfa antibiotics.  MEDICATIONS:  Current Outpatient Prescriptions  Medication Sig Dispense Refill  . acetaminophen (TYLENOL) 325 MG tablet Take 650 mg by mouth every 6 (six) hours as needed.      Marland Kitchen amLODipine (NORVASC) 10 MG tablet Take 10 mg by mouth daily.      Marland Kitchen anastrozole (ARIMIDEX) 1 MG tablet Take 1 mg by mouth daily.      Marland Kitchen aspirin 81 MG tablet Take 81 mg by mouth daily.      Marland Kitchen bismuth subsalicylate (PEPTO BISMOL) 262 MG chewable tablet Chew 524 mg by mouth as needed.      . carboxymethylcellulose (REFRESH PLUS) 0.5 % SOLN 1 drop 3 (three) times daily as needed.      . carvedilol (COREG) 25 MG tablet Take 25 mg by mouth 2 (two) times daily with a meal.      . cycloSPORINE (RESTASIS) 0.05 % ophthalmic emulsion 1 drop 2 (two) times daily.      Marland Kitchen MELATONIN PO Take 2.5 mg by mouth. 1/2 tablet once a day      . metFORMIN (GLUCOPHAGE) 500 MG tablet Take 500 mg by mouth 2 (two) times daily with a meal. 3 tablets in am, 2 tablets in evening      . Multiple Vitamin (MULTI-VITAMIN DAILY PO) Take by mouth.      Marland Kitchen omeprazole (PRILOSEC) 20 MG capsule Take 20 mg by mouth  daily.      . simvastatin (ZOCOR) 20 MG tablet Take 20 mg by mouth daily at 6 PM.       . telmisartan-hydrochlorothiazide (MICARDIS HCT) 40-12.5 MG per tablet Take 1 tablet by mouth daily.      . vitamin B-12 (CYANOCOBALAMIN) 100 MCG tablet Take 100 mcg by mouth daily.      Marland Kitchen VITAMIN D, ERGOCALCIFEROL, PO Take by mouth.      . hyaluronate sodium (RADIAPLEXRX) GEL Apply 1 application topically 2 (two) times daily. Apply to affected skin area after rad tx and bedtime  daily       No current facility-administered medications for this visit.    SURGICAL HISTORY:  Past Surgical History  Procedure Laterality Date  . Knee surgery  3710,6269    lt and rt knee scopes  .  Tonsillectomy    . Dilation and curettage of uterus    . Fracture surgery  2006    lt foot  . Colonoscopy    . Breast lumpectomy with needle localization and axillary sentinel lymph node bx Right 08/11/2013    Procedure: RIGHT BREAST LUMPECTOMY WITH NEEDLE LOCALIZATION AND AXILLARY SENTINEL LYMPH NODE BIOPSY;  Surgeon: Stark Klein, MD;  Location: [facility];  Service: General;  Laterality: Right;  . Breast biopsy Right 07/15/13    bx=mass 1 0'clock, invasive mammary ca, mammary ca in situ    REVIEW OF SYSTEMS:   A detailed review of systems is been assessed and the pertinent symptoms as mentioned in history of present illness   HEALTH MAINTENANCE:  PHYSICAL EXAMINATION: Blood pressure 182/68, pulse 65, temperature 98.2 F (36.8 C), temperature source Oral, resp. rate 18, height 5' 3.5" (1.613 m), weight 205 lb 11.2 oz (93.305 kg). Body mass index is 35.86 kg/(m^2). ECOG PERFORMANCE STATUS: 0 - Asymptomatic   General appearance: alert, cooperative and appears stated age Neck: no adenopathy, no carotid bruit, no JVD, supple, symmetrical, trachea midline and thyroid not enlarged, symmetric, no tenderness/mass/nodules Lymph nodes: Cervical, supraclavicular, and axillary nodes normal. Resp: clear to auscultation bilaterally Back: symmetric, no curvature. ROM normal. No CVA tenderness. Cardio: regular rate and rhythm GI: soft, non-tender; bowel sounds normal; no masses,  no organomegaly Extremities: extremities normal, atraumatic, no cyanosis or edema Neurologic: Grossly normal Breast examination: Right breast with lumpectomy scar noted. Left breast no masses felt. No bilateral axillary lymphadenopathy noted   LABORATORY DATA: Lab Results  Component Value Date   WBC 5.8 01/29/2014   HGB 12.1 01/29/2014   HCT 37.7 01/29/2014   MCV 84.1 01/29/2014   PLT 207 01/29/2014      Chemistry      Component Value Date/Time   NA 141 01/29/2014 1331   K 4.4 01/29/2014 1331   CO2  28 01/29/2014 1331   BUN 22.0 01/29/2014 1331   CREATININE 0.8 01/29/2014 1331      Component Value Date/Time   CALCIUM 10.2 01/29/2014 1331   ALKPHOS 70 01/29/2014 1331   AST 12 01/29/2014 1331   ALT 9 01/29/2014 1331   BILITOT 0.29 01/29/2014 1331    Diagnosis 1. Breast, lumpectomy, Right - INVASIVE GRADE II LOBULAR CARCINOMA SPANNING 1.1 CM IN GREATEST DIMENSION. - ASSOCIATED LOBULAR CARCINOMA IN SITU. - MARGINS ARE NEGATIVE. - SEE ONCOLOGY TEMPLATE. 2. Lymph node, sentinel, biopsy, Right axillary #1 - ONE BENIGN LYMPH NODE WITH NO TUMOR SEEN (0/1). - SEE COMMENT. 3. Lymph node, sentinel, biopsy, Right axillary #2 - ONE BENIGN LYMPH NODE WITH NO TUMOR SEEN (  0/1). - SEE COMMENT. Microscopic Comment 1. BREAST, INVASIVE TUMOR, WITH LYMPH NODE SAMPLING Specimen, including laterality and lymph node sampling (sentinel, non-sentinel): Right partial breast with right sentinel lymph node sampling. Procedure: Right breast lumpectomy with right sentinel lymph node biopsies. Histologic type: Invasive lobular carcinoma. Grade: II. Tubule formation: 3. Nuclear pleomorphism: 2. Mitotic: 2. Tumor size (gross measurement and glass slide measurement): 1.1 cm in greatest dimension. Margins: Invasive, distance to closest margin: 0.2 cm (posterior margin). Lymphovascular invasion: Definitive lymphovascular invasion is not identified. Ductal carcinoma in situ: No. Lobular neoplasia: Yes, lobular carcinoma in situ present. Tumor focality: Unifocal. Treatment effect: N/A. Extent of tumor: Tumor confined to breast parenchyma. Skin: Not received. Nipple: Not received. 1 of 3 FINAL for Laurie Fox, Laurie Fox ([id number]) Microscopic Comment(continued) Skeletal muscle: Not received. Lymph nodes: Examined: 2 Sentinel. 0 Non-sentinel. 2 Total. Lymph nodes with metastasis: 0. Breast prognostic profile: Performed on previous case [id number] Estrogen receptor: 100%, positive. Progesterone receptor:  100%, positive. Her 2 neu: 1.40 ratio, no amplification . Ki-67: 25%. Non-neoplastic breast: Fat necrosis present. TNM: pT1c, pN0, MX. Comments: An E-Cadherin immunohistochemical stain is performed on a representative block of tumor. It is negative in both the in situ and invasive components confirming the lobular nature of the tumor. As there was previously no amplification of Her-2 neu by C[facility] this will be repeated on the current tumor and reported in an addendum. 2. , 3. The cytokeratin AE1/AE3 immunostain is performed on both lymph nodes (two stains total). The stains are negative confirming the lack of metastatic carcinoma. (RH:kh 08-13-13)   RADIOGRAPHIC STUDIES:  Nm Sentinel Node Inj-no Rpt (breast)  08/11/2013   CLINICAL DATA: right breast cancer   Sulfur colloid was injected intradermally by the nuclear medicine  technologist for breast cancer sentinel node localization.    [place] Rt Plc Breast Loc Dev   1st Lesion  Inc US Guide  08/11/2013   CLINICAL DATA:  Known right breast carcinoma. Preoperative needle localization.  EXAM: ULTRASOUND NEEDLE LOCALIZATION OF THE right BREAST  COMPARISON:  Previous exams.  FINDINGS: Patient presents for needle localization prior to surgical excision of right breast carcinoma. I met with the patient and we discussed the procedure of needle localization including benefits and alternatives. We discussed the high likelihood of a successful procedure. We discussed the risks of the procedure, including infection, bleeding, tissue injury, and further surgery. Informed, written consent was given.  The usual time-out protocol was performed immediately prior to the procedure.  Using ultrasound guidance, sterile technique, 2% lidocaine and a 9 cm ultra wire, the mass with adjacent clip waslocalized using a medial approach. The films are marked for Dr. Barry Dienes.  Specimen radiograph is performed at surgery and confirms the mass, clip, and the intact wire to be present in the  tissue sample. The specimen is marked for pathology.  IMPRESSION: Needle localization of the mass located within the right breast as discussed above. No apparent complications.   Electronically Signed   By: Luberta Robertson M.D.   On: 08/11/2013 16:00    ASSESSMENT:  #76.  80 year old female with new diagnosis of invasive lobular and in situ carcinoma of the right breast originally diagnosed in December 2014. Patient was seen in the multidisciplinary breast clinic at that time. Her tumor was estrogen receptor +100% progesterone receptor +100% HER-2/neu negative with a proliferation marker Ki-67 25%. She is now status post right lumpectomy with sentinel lymph node biopsy. Final pathology did reveal 1.1 cm invasive lobular carcinoma 2 sentinel  nodes were negative for metastatic disease ER/PR positive HER-2/neu negative  #2 completed radiation therapy in 10/12/2013  #3 started on Arimidex 1 mg by mouth once once daily from 10/13/2013  PLAN:  #1 Princella is tolerating anastrozole well. Continue anastrozole 1 mg by mouth once daily. We will arrange for her right breast mammogram in September 2015, since that will be her 6 months after radiation therapy. Her CBC and differential and CMP are within normal range.  #2 I have reviewed DEXA scan which was done in January 2015 -was normal by [facility] criteria  Next followup visit  in 4 months with CBC differential and CMP   All questions were answered. The patient knows to call the clinic with any problems, questions or concerns. We can certainly see the patient much sooner if necessary.  I spent 20 minutes counseling the patient face to face. The total time spent in the appointment was 30 minutes.   Wilmon Arms, MD Medical/Oncology [facility] [phone] (Office)  01/29/2014, 2:34 PM

## 2014-04-07 ENCOUNTER — Telehealth: Payer: Self-pay | Admitting: Hematology and Oncology

## 2014-04-07 NOTE — Telephone Encounter (Signed)
, °

## 2014-04-15 ENCOUNTER — Encounter (INDEPENDENT_AMBULATORY_CARE_PROVIDER_SITE_OTHER): Payer: Self-pay

## 2014-04-15 ENCOUNTER — Ambulatory Visit
Admission: RE | Admit: 2014-04-15 | Discharge: 2014-04-15 | Disposition: A | Discharge status: Home / Self Care | Payer: Medicare Other | Source: Ambulatory Visit | Attending: Hematology and Oncology | Admitting: Hematology and Oncology

## 2014-04-15 DIAGNOSIS — C50211 Malignant neoplasm of upper-inner quadrant of right female breast: Secondary | ICD-10-CM

## 2014-04-22 ENCOUNTER — Ambulatory Visit (INDEPENDENT_AMBULATORY_CARE_PROVIDER_SITE_OTHER): Payer: Medicare Other | Admitting: General Surgery

## 2014-05-31 ENCOUNTER — Other Ambulatory Visit: Payer: Self-pay | Admitting: *Deleted

## 2014-05-31 ENCOUNTER — Other Ambulatory Visit: Payer: Medicare Other

## 2014-05-31 ENCOUNTER — Ambulatory Visit: Payer: Medicare Other | Admitting: Hematology and Oncology

## 2014-05-31 DIAGNOSIS — C50211 Malignant neoplasm of upper-inner quadrant of right female breast: Secondary | ICD-10-CM

## 2014-06-01 ENCOUNTER — Telehealth: Payer: Self-pay | Admitting: Hematology and Oncology

## 2014-06-01 ENCOUNTER — Ambulatory Visit (HOSPITAL_BASED_OUTPATIENT_CLINIC_OR_DEPARTMENT_OTHER): Payer: Medicare Other | Admitting: Hematology and Oncology

## 2014-06-01 ENCOUNTER — Other Ambulatory Visit (HOSPITAL_BASED_OUTPATIENT_CLINIC_OR_DEPARTMENT_OTHER): Payer: Medicare Other

## 2014-06-01 VITALS — BP 156/43 | HR 61 | Temp 98.4°F | Resp 19 | Ht 63.5 in | Wt 202.6 lb

## 2014-06-01 DIAGNOSIS — C50211 Malignant neoplasm of upper-inner quadrant of right female breast: Secondary | ICD-10-CM

## 2014-06-01 LAB — COMPREHENSIVE METABOLIC PANEL (CC13)
ALT: 17 U/L (ref 0–55)
AST: 15 U/L (ref 5–34)
Albumin: 4.1 g/dL (ref 3.5–5.0)
Alkaline Phosphatase: 80 U/L (ref 40–150)
Anion Gap: 12 mEq/L — ABNORMAL HIGH (ref 3–11)
BUN: 19.9 mg/dL (ref 7.0–26.0)
CALCIUM: 10 mg/dL (ref 8.4–10.4)
CHLORIDE: 104 meq/L (ref 98–109)
CO2: 27 mEq/L (ref 22–29)
CREATININE: 1 mg/dL (ref 0.6–1.1)
Glucose: 125 mg/dl (ref 70–140)
Potassium: 4.2 mEq/L (ref 3.5–5.1)
Sodium: 142 mEq/L (ref 136–145)
Total Bilirubin: 0.39 mg/dL (ref 0.20–1.20)
Total Protein: 6.7 g/dL (ref 6.4–8.3)

## 2014-06-01 LAB — CBC WITH DIFFERENTIAL/PLATELET
BASO%: 0.5 % (ref 0.0–2.0)
BASOS ABS: 0 10*3/uL (ref 0.0–0.1)
EOS%: 2.8 % (ref 0.0–7.0)
Eosinophils Absolute: 0.2 10*3/uL (ref 0.0–0.5)
HEMATOCRIT: 39.4 % (ref 34.8–46.6)
HEMOGLOBIN: 12.4 g/dL (ref 11.6–15.9)
LYMPH#: 1.1 10*3/uL (ref 0.9–3.3)
LYMPH%: 16.8 % (ref 14.0–49.7)
MCH: 26.5 pg (ref 25.1–34.0)
MCHC: 31.6 g/dL (ref 31.5–36.0)
MCV: 84.1 fL (ref 79.5–101.0)
MONO#: 0.5 10*3/uL (ref 0.1–0.9)
MONO%: 8.1 % (ref 0.0–14.0)
NEUT#: 4.8 10*3/uL (ref 1.5–6.5)
NEUT%: 71.8 % (ref 38.4–76.8)
Platelets: 228 10*3/uL (ref 145–400)
RBC: 4.68 10*6/uL (ref 3.70–5.45)
RDW: 15.1 % — ABNORMAL HIGH (ref 11.2–14.5)
WBC: 6.7 10*3/uL (ref 3.9–10.3)

## 2014-06-01 NOTE — Progress Notes (Signed)
Patient Care Team: Irven Shelling, MD as PCP - General (Internal Medicine)  DIAGNOSIS: Breast cancer of upper-inner quadrant of right female breast   Primary site: Breast (Right)   Staging method: AJCC 7th Edition   Clinical: Stage IA (T1c, N0, cM0)   Summary: Stage IA (T1c, N0, cM0)   Clinical comments: Staged at breast conference 07/22/13.  Oncology history: #1Patient had an annual screening mammogram. In her right breast she was noted to have a mass at the 1:00 position. By ultrasound it measured 7 mm. MRI reveals 1.3 cm area postbiopsy changes. Core needle biopsy of the mass at 1:00 position revealed a grade 1/2 invasive ductal carcinoma/DCIS. Tumor was ER positive PR positive HER-2/neu negative with a proliferation marker Ki-67 30%  #2 patient is status post right lumpectomy with the final pathology revealing a 1.1 cm invasive lobular and in situ carcinoma that is strongly estrogen receptor and progesterone receptor positive HER-2/neu negative. 2 sentinel nodes were negative for metastatic disease. Proliferation marker Ki-67 25%.   CURRENT THERAPY: Anastrozole 1 mg by mouth once daily started on 10/13/2013   CHIEF COMPLIANT: Followup of breast cancer  INTERVAL HISTORY: Laurie Fox is a 80 year old Caucasian with above-mentioned history of breast cancer diagnosed and treated with lumpectomy and radiation followed by anastrozole but stopped in March 2015. She is tolerating anastrozole extremely well without any major problems or concerns. She is scheduled for a mammogram in November 2015.  REVIEW OF SYSTEMS:   Constitutional: Denies fevers, chills or abnormal weight loss Eyes: Denies blurriness of vision Ears, nose, mouth, throat, and face: Denies mucositis or sore throat Respiratory: Denies cough, dyspnea or wheezes Cardiovascular: Denies palpitation, chest discomfort or lower extremity swelling Gastrointestinal:  Denies nausea, heartburn or change in bowel habits Skin:  Denies abnormal skin rashes Lymphatics: Denies new lymphadenopathy or easy bruising Neurological:Denies numbness, tingling or new weaknesses Behavioral/Psych: Mood is stable, no new changes  Breast: Occasional discomfort in both breasts. All other systems were reviewed with the patient and are negative.  I have reviewed the past medical history, past surgical history, social history and family history with the patient and they are unchanged from previous note.  ALLERGIES:  is allergic to codeine and sulfa antibiotics.  MEDICATIONS:  Current Outpatient Prescriptions  Medication Sig Dispense Refill  . acetaminophen (TYLENOL) 325 MG tablet Take 650 mg by mouth every 6 (six) hours as needed.      Marland Kitchen amLODipine (NORVASC) 10 MG tablet Take 10 mg by mouth daily.      Marland Kitchen anastrozole (ARIMIDEX) 1 MG tablet Take 1 mg by mouth daily.      Marland Kitchen aspirin 81 MG tablet Take 81 mg by mouth daily.      Marland Kitchen bismuth subsalicylate (PEPTO BISMOL) 262 MG chewable tablet Chew 524 mg by mouth as needed.      . carboxymethylcellulose (REFRESH PLUS) 0.5 % SOLN 1 drop 3 (three) times daily as needed.      . carvedilol (COREG) 25 MG tablet Take 25 mg by mouth 2 (two) times daily with a meal.      . cycloSPORINE (RESTASIS) 0.05 % ophthalmic emulsion 1 drop 2 (two) times daily.      Marland Kitchen MELATONIN PO Take 2.5 mg by mouth. 1/2 tablet once a day      . metFORMIN (GLUCOPHAGE) 500 MG tablet Take 500 mg by mouth 2 (two) times daily with a meal. 3 tablets in am, 2 tablets in evening      . Multiple  Vitamin (MULTI-VITAMIN DAILY PO) Take by mouth.      Marland Kitchen omeprazole (PRILOSEC) 20 MG capsule Take 20 mg by mouth daily.      . simvastatin (ZOCOR) 20 MG tablet Take 20 mg by mouth daily at 6 PM.       . telmisartan-hydrochlorothiazide (MICARDIS HCT) 40-12.5 MG per tablet Take 1 tablet by mouth daily.      . vitamin B-12 (CYANOCOBALAMIN) 100 MCG tablet Take 100 mcg by mouth daily.      Marland Kitchen VITAMIN D, ERGOCALCIFEROL, PO Take by mouth.       No  current facility-administered medications for this visit.    PHYSICAL EXAMINATION: ECOG PERFORMANCE STATUS: 0 - Asymptomatic  Filed Vitals:   06/01/14 1449  BP: 156/43  Pulse:   Temp:   Resp:    Filed Weights   06/01/14 1407  Weight: 202 lb 9 oz (91.882 kg)    GENERAL:alert, no distress and comfortable SKIN: skin color, texture, turgor are normal, no rashes or significant lesions EYES: normal, Conjunctiva are pink and non-injected, sclera clear OROPHARYNX:no exudate, no erythema and lips, buccal mucosa, and tongue normal  NECK: supple, thyroid normal size, non-tender, without nodularity LYMPH:  no palpable lymphadenopathy in the cervical, axillary or inguinal LUNGS: clear to auscultation and percussion with normal breathing effort HEART: regular rate & rhythm and no murmurs and no lower extremity edema ABDOMEN:abdomen soft, non-tender and normal bowel sounds Musculoskeletal:no cyanosis of digits and no clubbing  NEURO: alert & oriented x 3 with fluent speech, no focal motor/sensory deficits BREAST: No palpable masses or nodules in either right or left breasts. No palpable axillary supraclavicular or infraclavicular adenopathy no breast tenderness or nipple discharge.   LABORATORY DATA:  I have reviewed the data as listed   Chemistry      Component Value Date/Time   NA 141 01/29/2014 1331   K 4.4 01/29/2014 1331   CO2 28 01/29/2014 1331   BUN 22.0 01/29/2014 1331   CREATININE 0.8 01/29/2014 1331      Component Value Date/Time   CALCIUM 10.2 01/29/2014 1331   ALKPHOS 70 01/29/2014 1331   AST 12 01/29/2014 1331   ALT 9 01/29/2014 1331   BILITOT 0.29 01/29/2014 1331       Lab Results  Component Value Date   WBC 5.8 01/29/2014   HGB 12.1 01/29/2014   HCT 37.7 01/29/2014   MCV 84.1 01/29/2014   PLT 207 01/29/2014   NEUTROABS 4.1 01/29/2014     RADIOGRAPHIC STUDIES: I have personally reviewed the radiology reports and agreed with their findings. No results found.    ASSESSMENT & PLAN:  Breast cancer of upper-inner quadrant of right female breast Right breast invasive lobular carcinoma and LCIS diagnosed December 2014 yard 100%, PR percent, HER-2 negative, Ki-67 25% status post right lumpectomy 1.1 cm tumor 2 SLN negative status post radiation therapy and started to Arimidex April 2015.  Surveillance: Breast exam today is normal. Mammograms are set up for November. I would like to see her back in 6 months for a followup.  Aromatase inhibitor surveillance: Patient is tolerating Arimidex extremely well other than occasional hot flashes. Her bone density test showed excellent bone density. Continue with the same treatment plan for a total period of 5 years that was complete April 2020.   Orders Placed This Encounter  Procedures  . CBC with Differential    Standing Status: Future     Number of Occurrences:      Standing Expiration Date:  06/01/2015  . Comprehensive metabolic panel (Cmet) - [facility]    Standing Status: Future     Number of Occurrences:      Standing Expiration Date: 06/01/2015   The patient has a good understanding of the overall plan. she agrees with it. She will call with any problems that may develop before her next visit here.  I spent 15 minutes counseling the patient face to face. The total time spent in the appointment was 20 minutes and more than 50% was on counseling and review of test results    Rulon Eisenmenger, MD 06/01/2014 2:51 PM

## 2014-06-01 NOTE — Assessment & Plan Note (Signed)
Right breast invasive lobular carcinoma and LCIS diagnosed December 2014 yard 100%, PR percent, HER-2 negative, Ki-67 25% status post right lumpectomy 1.1 cm tumor 2 SLN negative status post radiation therapy and started to Arimidex April 2015.  Surveillance: Breast exam today is normal. Mammograms are set up for November. I would like to see her back in 6 months for a followup.  Aromatase inhibitor surveillance: Patient is tolerating Arimidex extremely well other than occasional hot flashes. Her bone density test showed excellent bone density. Continue with the same treatment plan for a total period of 5 years that was complete April 2020.

## 2014-06-01 NOTE — Telephone Encounter (Signed)
, °

## 2014-06-03 ENCOUNTER — Other Ambulatory Visit: Payer: Self-pay

## 2014-06-03 ENCOUNTER — Other Ambulatory Visit: Payer: Self-pay | Admitting: Hematology and Oncology

## 2014-06-03 ENCOUNTER — Other Ambulatory Visit (INDEPENDENT_AMBULATORY_CARE_PROVIDER_SITE_OTHER): Payer: Self-pay | Admitting: General Surgery

## 2014-06-03 DIAGNOSIS — Z9889 Other specified postprocedural states: Secondary | ICD-10-CM

## 2014-06-03 DIAGNOSIS — Z853 Personal history of malignant neoplasm of breast: Secondary | ICD-10-CM

## 2014-07-07 ENCOUNTER — Ambulatory Visit
Admission: RE | Admit: 2014-07-07 | Discharge: 2014-07-07 | Disposition: A | Discharge status: Home / Self Care | Payer: Medicare Other | Source: Ambulatory Visit | Attending: Hematology and Oncology | Admitting: Hematology and Oncology

## 2014-07-07 DIAGNOSIS — Z853 Personal history of malignant neoplasm of breast: Secondary | ICD-10-CM

## 2014-07-07 DIAGNOSIS — Z9889 Other specified postprocedural states: Secondary | ICD-10-CM

## 2014-08-13 ENCOUNTER — Telehealth: Payer: Self-pay | Admitting: Physical Therapy

## 2014-08-13 NOTE — Telephone Encounter (Signed)
Called pt to r/s appt on 08/06/12 to end of January per Inez Catalina

## 2014-10-03 ENCOUNTER — Other Ambulatory Visit: Payer: Self-pay | Admitting: Oncology

## 2014-11-30 ENCOUNTER — Telehealth: Payer: Self-pay | Admitting: Hematology and Oncology

## 2014-11-30 ENCOUNTER — Other Ambulatory Visit (HOSPITAL_BASED_OUTPATIENT_CLINIC_OR_DEPARTMENT_OTHER): Payer: Medicare Other

## 2014-11-30 ENCOUNTER — Ambulatory Visit (HOSPITAL_BASED_OUTPATIENT_CLINIC_OR_DEPARTMENT_OTHER): Payer: Medicare Other | Admitting: Hematology and Oncology

## 2014-11-30 VITALS — BP 151/46 | HR 57 | Temp 97.8°F | Resp 18 | Ht 63.5 in | Wt 189.7 lb

## 2014-11-30 DIAGNOSIS — Z170 Estrogen receptor positive status [ER+]: Secondary | ICD-10-CM

## 2014-11-30 DIAGNOSIS — C50211 Malignant neoplasm of upper-inner quadrant of right female breast: Secondary | ICD-10-CM

## 2014-11-30 LAB — CBC WITH DIFFERENTIAL/PLATELET
BASO%: 0.2 % (ref 0.0–2.0)
Basophils Absolute: 0 10*3/uL (ref 0.0–0.1)
EOS%: 4.1 % (ref 0.0–7.0)
Eosinophils Absolute: 0.2 10*3/uL (ref 0.0–0.5)
HCT: 35.5 % (ref 34.8–46.6)
HEMOGLOBIN: 11.7 g/dL (ref 11.6–15.9)
LYMPH%: 22 % (ref 14.0–49.7)
MCH: 27.9 pg (ref 25.1–34.0)
MCHC: 33 g/dL (ref 31.5–36.0)
MCV: 84.7 fL (ref 79.5–101.0)
MONO#: 0.5 10*3/uL (ref 0.1–0.9)
MONO%: 9.3 % (ref 0.0–14.0)
NEUT#: 3.3 10*3/uL (ref 1.5–6.5)
NEUT%: 64.4 % (ref 38.4–76.8)
Platelets: 207 10*3/uL (ref 145–400)
RBC: 4.19 10*6/uL (ref 3.70–5.45)
RDW: 14.4 % (ref 11.2–14.5)
WBC: 5.1 10*3/uL (ref 3.9–10.3)
lymph#: 1.1 10*3/uL (ref 0.9–3.3)

## 2014-11-30 LAB — COMPREHENSIVE METABOLIC PANEL (CC13)
ALT: 11 U/L (ref 0–55)
AST: 11 U/L (ref 5–34)
Albumin: 4 g/dL (ref 3.5–5.0)
Alkaline Phosphatase: 68 U/L (ref 40–150)
Anion Gap: 15 mEq/L — ABNORMAL HIGH (ref 3–11)
BILIRUBIN TOTAL: 0.26 mg/dL (ref 0.20–1.20)
BUN: 18.9 mg/dL (ref 7.0–26.0)
CO2: 24 meq/L (ref 22–29)
Calcium: 10 mg/dL (ref 8.4–10.4)
Chloride: 106 mEq/L (ref 98–109)
Creatinine: 0.8 mg/dL (ref 0.6–1.1)
EGFR: 70 mL/min/{1.73_m2} — AB (ref >90)
Glucose: 119 mg/dl (ref 70–140)
Potassium: 4 mEq/L (ref 3.5–5.1)
Sodium: 144 mEq/L (ref 136–145)
TOTAL PROTEIN: 6.2 g/dL — AB (ref 6.4–8.3)

## 2014-11-30 NOTE — Progress Notes (Signed)
Patient Care Team: Lavone Orn, MD as PCP - General (Internal Medicine)  DIAGNOSIS: Breast cancer of upper-inner quadrant of right female breast   Staging form: Breast, AJCC 7th Edition     Clinical: Stage IA (T1c, N0, cM0) - Unsigned       Staging comments: Staged at breast conference 07/22/13.      Pathologic: No stage assigned - Unsigned   SUMMARY OF ONCOLOGIC HISTORY:   Breast cancer of upper-inner quadrant of right female breast   08/11/2013 Surgery Right breast lumpectomy: Invasive lobular cancer, grade 2, 1.1 cm, with LCIS, 0/2 lymph nodes, T1 cN0 M0 stage IA, ER 100%, PR 100%, HER-2 -1.4 ratio, Ki-67 25%   09/11/2013 - 10/09/2013 Radiation Therapy Adjuvant radiation therapy   10/13/2013 -  Anti-estrogen oral therapy Anastrozole 1 mg daily 5 years    CHIEF COMPLIANT: Follow-up on anastrozole for breast cancer  INTERVAL HISTORY: Laurie Fox is a 81 year old lady with above-mentioned history of right-sided breast cancer treated with lumpectomy followed by radiation and has been on anastrozole for the past 1 year. She is tolerating anastrozole extremely well without any major problems or concerns. Denies any hot flashes but she does have muscle aches and pains. Denies any lumps or nodules in the breast.  Patient complains of pain in the right knee from arthritis.  REVIEW OF SYSTEMS:   Constitutional: Denies fevers, chills or abnormal weight loss Eyes: Denies blurriness of vision Ears, nose, mouth, throat, and face: Denies mucositis or sore throat Respiratory: Denies cough, dyspnea or wheezes Cardiovascular: Denies palpitation, chest discomfort or lower extremity swelling Gastrointestinal:  Denies nausea, heartburn or change in bowel habits Skin: Denies abnormal skin rashes Lymphatics: Denies new lymphadenopathy or easy bruising Neurological:Denies numbness, tingling or new weaknesses, complains of musculoskeletal aches and pains Behavioral/Psych: Mood is stable, no new changes   Breast:  denies any pain or lumps or nodules in either breasts All other systems were reviewed with the patient and are negative.  I have reviewed the past medical history, past surgical history, social history and family history with the patient and they are unchanged from previous note.  ALLERGIES:  is allergic to codeine and sulfa antibiotics.  MEDICATIONS:  Current Outpatient Prescriptions  Medication Sig Dispense Refill  . acetaminophen (TYLENOL) 325 MG tablet Take 650 mg by mouth every 6 (six) hours as needed.    Marland Kitchen amLODipine (NORVASC) 10 MG tablet Take 10 mg by mouth daily.    Marland Kitchen anastrozole (ARIMIDEX) 1 MG tablet Take 1 mg by mouth daily.    Marland Kitchen anastrozole (ARIMIDEX) 1 MG tablet TAKE 1 TABLET BY MOUTH EVERY DAY 90 tablet 1  . aspirin 81 MG tablet Take 81 mg by mouth daily.    Marland Kitchen bismuth subsalicylate (PEPTO BISMOL) 262 MG chewable tablet Chew 524 mg by mouth as needed.    . carboxymethylcellulose (REFRESH PLUS) 0.5 % SOLN 1 drop 3 (three) times daily as needed.    . carvedilol (COREG) 25 MG tablet Take 25 mg by mouth 2 (two) times daily with a meal.    . cycloSPORINE (RESTASIS) 0.05 % ophthalmic emulsion 1 drop 2 (two) times daily.    Marland Kitchen MELATONIN PO Take 2.5 mg by mouth. 1/2 tablet once a day    . metFORMIN (GLUCOPHAGE) 500 MG tablet Take 500 mg by mouth 2 (two) times daily with a meal. 3 tablets in am, 2 tablets in evening    . Multiple Vitamin (MULTI-VITAMIN DAILY PO) Take by mouth.    Marland Kitchen  omeprazole (PRILOSEC) 20 MG capsule Take 20 mg by mouth daily.    . simvastatin (ZOCOR) 20 MG tablet Take 20 mg by mouth daily at 6 PM.     . telmisartan-hydrochlorothiazide (MICARDIS HCT) 40-12.5 MG per tablet Take 1 tablet by mouth daily.    . vitamin B-12 (CYANOCOBALAMIN) 100 MCG tablet Take 100 mcg by mouth daily.    Marland Kitchen VITAMIN D, ERGOCALCIFEROL, PO Take by mouth.     No current facility-administered medications for this visit.    PHYSICAL EXAMINATION: ECOG PERFORMANCE STATUS: 1 -  Symptomatic but completely ambulatory  Filed Vitals:   11/30/14 1515  BP: 151/46  Pulse: 57  Temp: 97.8 F (36.6 C)  Resp: 18   Filed Weights   11/30/14 1515  Weight: 189 lb 11.2 oz (86.047 kg)    GENERAL:alert, no distress and comfortable SKIN: skin color, texture, turgor are normal, no rashes or significant lesions EYES: normal, Conjunctiva are pink and non-injected, sclera clear OROPHARYNX:no exudate, no erythema and lips, buccal mucosa, and tongue normal  NECK: supple, thyroid normal size, non-tender, without nodularity LYMPH:  no palpable lymphadenopathy in the cervical, axillary or inguinal LUNGS: clear to auscultation and percussion with normal breathing effort HEART: regular rate & rhythm and no murmurs and no lower extremity edema ABDOMEN:abdomen soft, non-tender and normal bowel sounds Musculoskeletal:no cyanosis of digits and no clubbing  NEURO: alert & oriented x 3 with fluent speech, no focal motor/sensory deficits BREAST: No palpable masses or nodules in either right or left breasts. No palpable axillary supraclavicular or infraclavicular adenopathy no breast tenderness or nipple discharge. (exam performed in the presence of a chaperone)  LABORATORY DATA:  I have reviewed the data as listed   Chemistry      Component Value Date/Time   NA 144 11/30/2014 1504   K 4.0 11/30/2014 1504   CO2 24 11/30/2014 1504   BUN 18.9 11/30/2014 1504   CREATININE 0.8 11/30/2014 1504      Component Value Date/Time   CALCIUM 10.0 11/30/2014 1504   ALKPHOS 68 11/30/2014 1504   AST 11 11/30/2014 1504   ALT 11 11/30/2014 1504   BILITOT 0.26 11/30/2014 1504       Lab Results  Component Value Date   WBC 5.1 11/30/2014   HGB 11.7 11/30/2014   HCT 35.5 11/30/2014   MCV 84.7 11/30/2014   PLT 207 11/30/2014   NEUTROABS 3.3 11/30/2014   ASSESSMENT & PLAN:  Breast cancer of upper-inner quadrant of right female breast Right breast invasive lobular carcinoma and LCIS diagnosed  December 2014 yard 100%, PR percent, HER-2 negative, Ki-67 25% status post right lumpectomy 1.1 cm tumor 2 SLN negative status post radiation therapy and started to Arimidex April 2015.  Arimidex toxicities: No major side effects to Arimidex  Normal bone density 08/28/2013 T score -0.4  Breast Cancer Surveillance: 1. Breast exam 11/30/2014: Normal 2. Mammogram 07/07/2014  No abnormalities. Postsurgical changes. Breast Density Category B. I recommended that she get 3-D mammograms for surveillance. Discussed the differences between different breast density categories.   Right knee arthritis: Because of this he is unable to do much physical activity. She is able to walk but cannot do much exercise. Return to clinic in 6 months for follow-up   No orders of the defined types were placed in this encounter.   The patient has a good understanding of the overall plan. she agrees with it. She will call with any problems that may develop before her next visit here.  Rulon Eisenmenger, MD

## 2014-11-30 NOTE — Assessment & Plan Note (Addendum)
Right breast invasive lobular carcinoma and LCIS diagnosed December 2014 yard 100%, PR percent, HER-2 negative, Ki-67 25% status post right lumpectomy 1.1 cm tumor 2 SLN negative status post radiation therapy and started to Arimidex April 2015.  Arimidex toxicities: No major side effects to Arimidex  Normal bone density 08/28/2013 T score -0.4  Breast Cancer Surveillance: 1. Breast exam 11/30/2014: Normal 2. Mammogram 07/07/2014  No abnormalities. Postsurgical changes. Breast Density Category B. I recommended that she get 3-D mammograms for surveillance. Discussed the differences between different breast density categories.   Return to clinic in 6 months for follow-up

## 2014-11-30 NOTE — Telephone Encounter (Signed)
Appointments made and avs printed for patient °

## 2015-02-08 IMAGING — US US BREAST*R*
1 series · 7 of 7 positions shown · non-contrast
Comparison: Priors

ADDENDUM:
No sonographically identified right axillary lymphadenopathy.
CLINICAL DATA: Patient recalled from screening for right breast
mass.

EXAM:
DIGITAL DIAGNOSTIC  RIGHT MAMMOGRAM WITH CAD
ULTRASOUND RIGHT BREAST

[Series 1: breast · 7 of 7 slices shown]
[im 1/7]
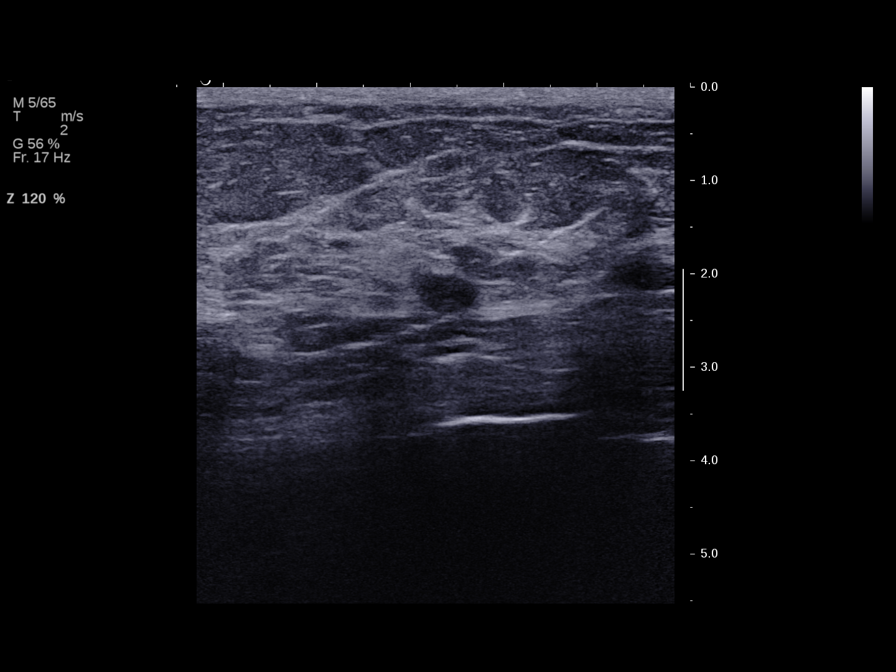
[im 2/7]
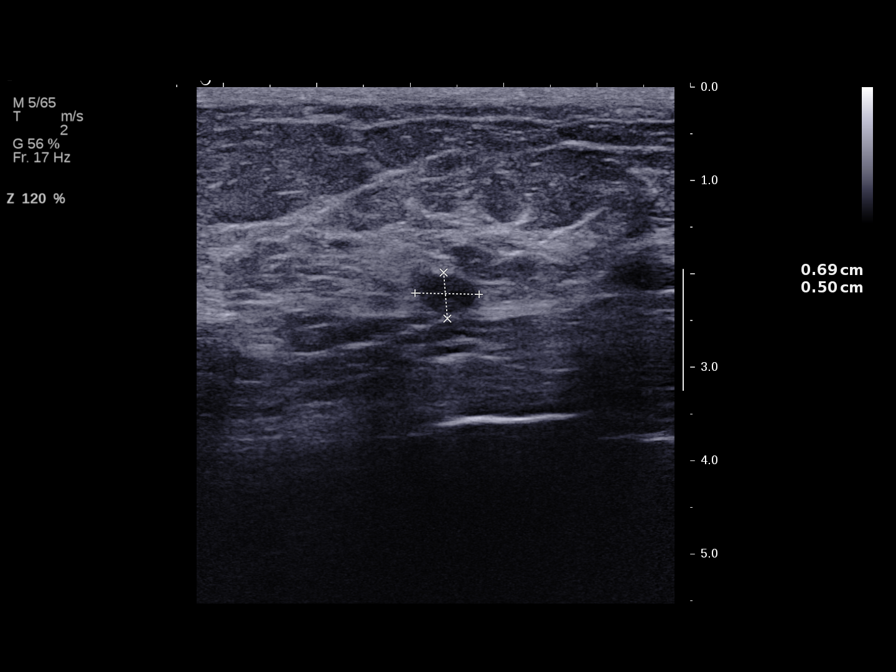
[im 3/7]
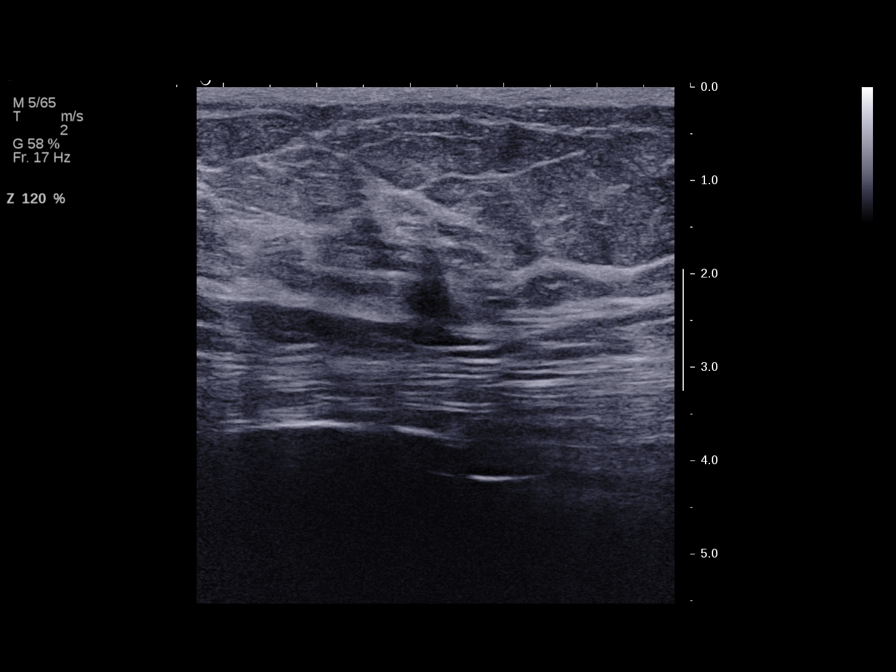
[im 4/7]
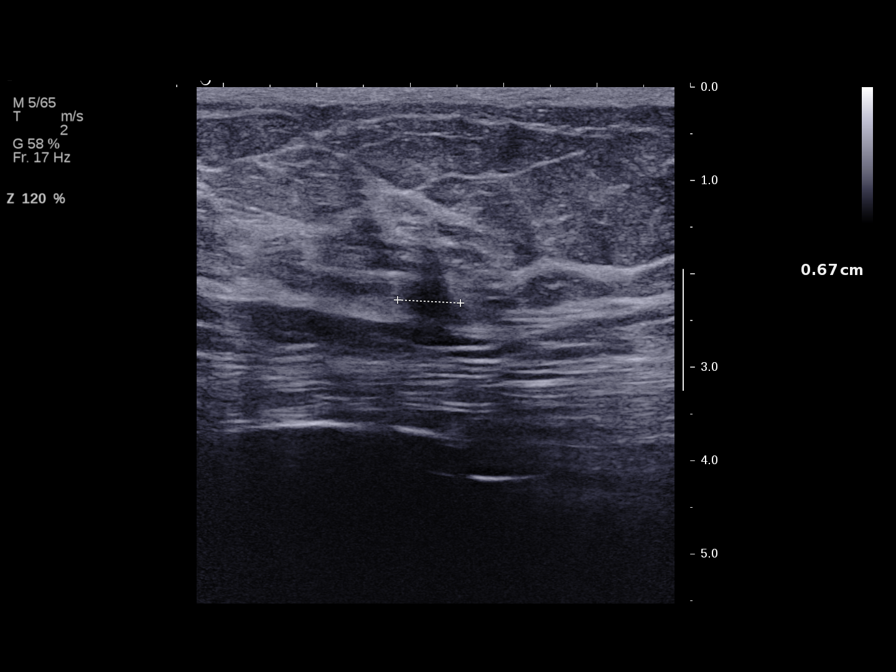
[im 5/7]
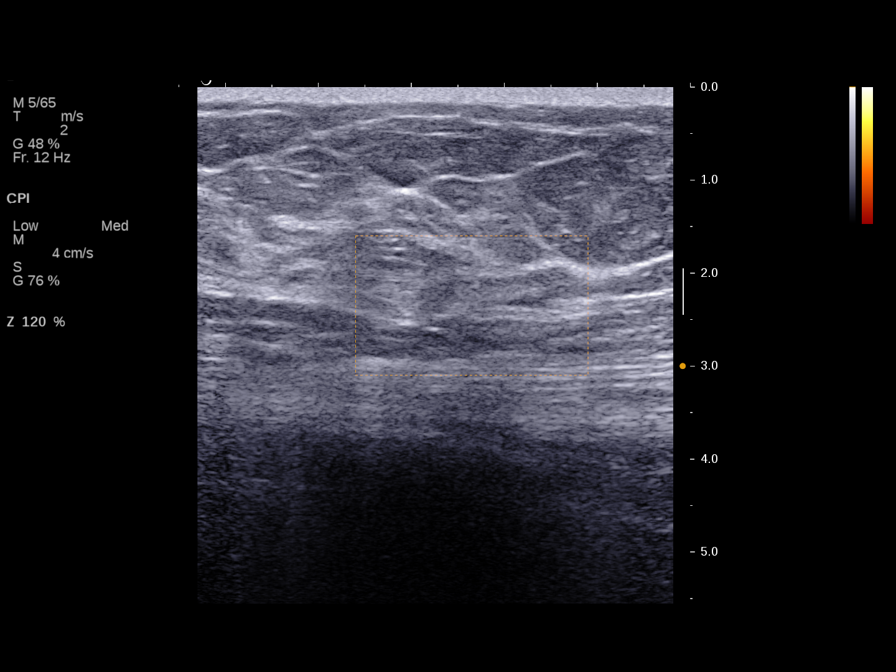
[im 6/7]
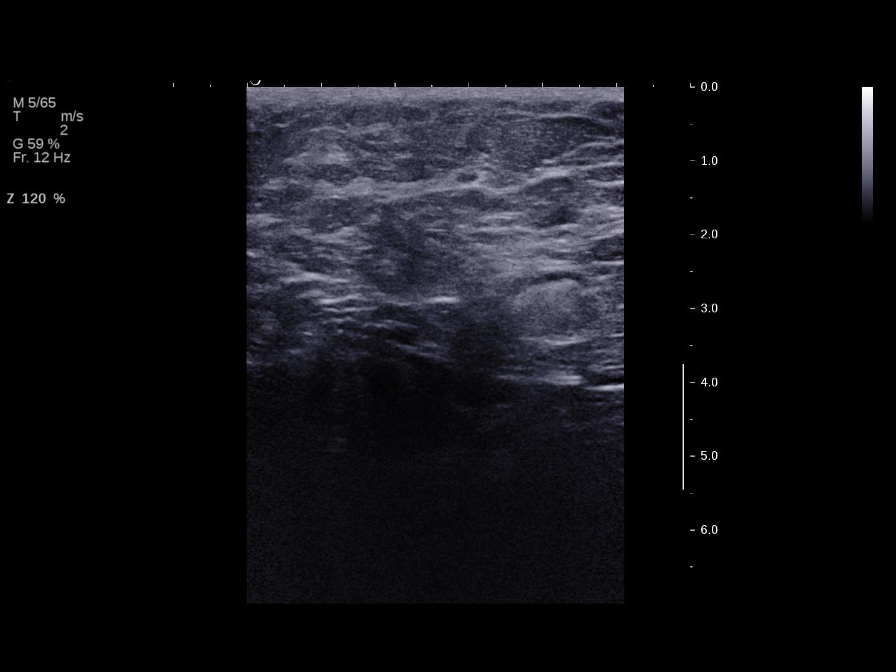
[im 7/7]
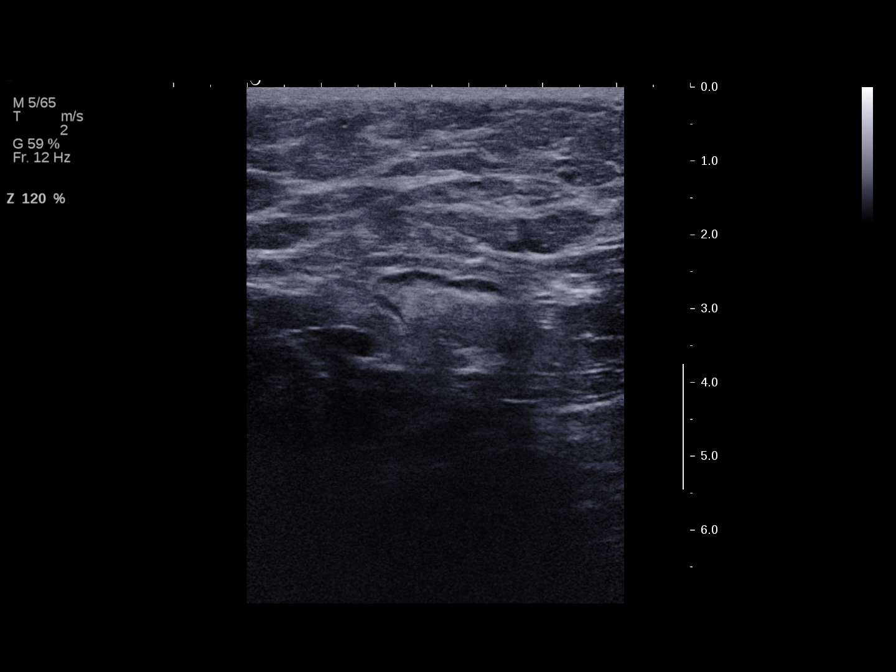

[7 of 7 positions shown; findings below may reference images not displayed]

ACR Breast Density Category b: There are scattered areas of
fibroglandular density.
FINDINGS: Spot compression views of the right breast 1 o'clock position middle
to posterior depth demonstrates a persistent 1 cm irregular mass.

Mammographic images were processed with CAD.

On physical exam,I palpate no discrete mass within the 1 o'clock
position middle to posterior depth right breast..

Ultrasound is performed, showing a 7 x 7 x 5 mm irregular hypoechoic
mass within the right breast 1 o'clock position 5 cm from the
nipple..
IMPRESSION: Suspicious right breast mass. Ultrasound-guided core needle biopsy
is recommended.

RECOMMENDATION:
Ultrasound-guided core needle biopsy right breast mass. This is
scheduled for July 15, 2013.

I have discussed the findings and recommendations with the patient.
Results were also provided in writing at the conclusion of the
visit.

BI-RADS CATEGORY  4: Suspicious abnormality - biopsy should be
considered.

## 2015-02-08 IMAGING — MG MM DIGITAL DIAGNOSTIC LIMITED*R*
2 series · 2 of 2 positions shown · non-contrast
Comparison: Priors

ADDENDUM:
No sonographically identified right axillary lymphadenopathy.
CLINICAL DATA: Patient recalled from screening for right breast
mass.

EXAM:
DIGITAL DIAGNOSTIC  RIGHT MAMMOGRAM WITH CAD
ULTRASOUND RIGHT BREAST

[R CC]
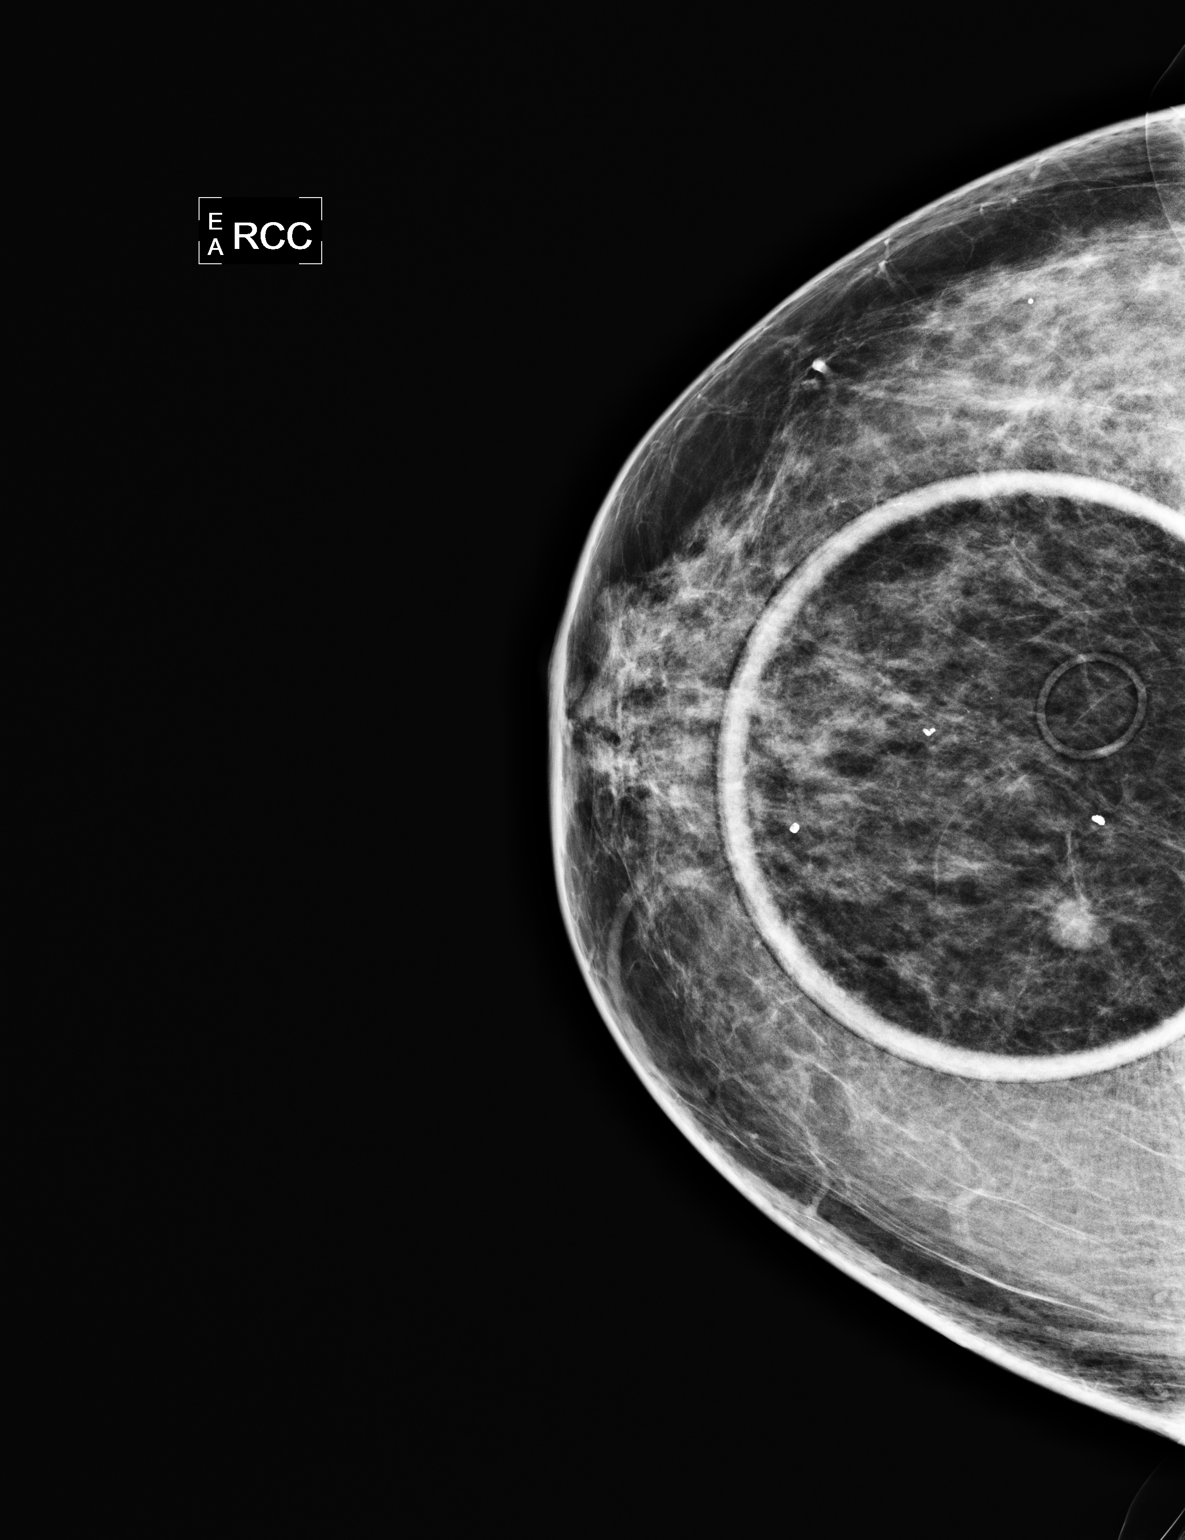

[R MLO]
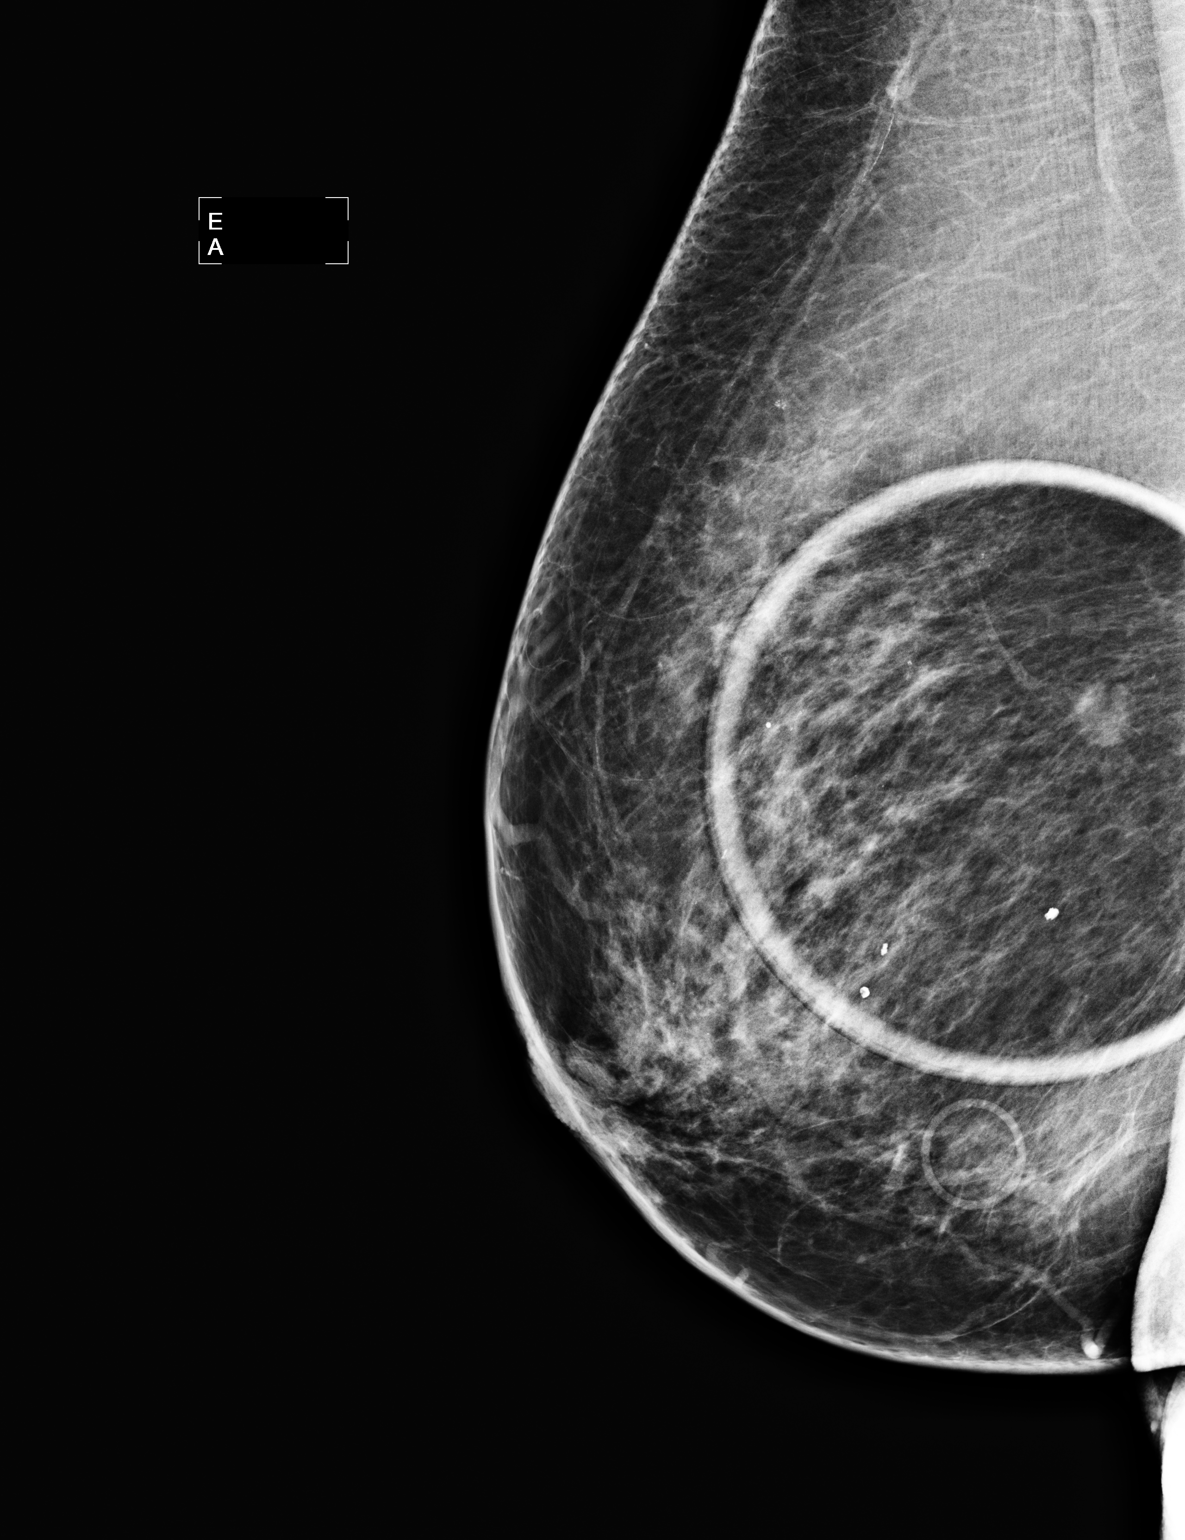

[2 of 2 positions shown; findings below may reference images not displayed]

ACR Breast Density Category b: There are scattered areas of
fibroglandular density.
FINDINGS: Spot compression views of the right breast 1 o'clock position middle
to posterior depth demonstrates a persistent 1 cm irregular mass.

Mammographic images were processed with CAD.

On physical exam,I palpate no discrete mass within the 1 o'clock
position middle to posterior depth right breast..

Ultrasound is performed, showing a 7 x 7 x 5 mm irregular hypoechoic
mass within the right breast 1 o'clock position 5 cm from the
nipple..
IMPRESSION: Suspicious right breast mass. Ultrasound-guided core needle biopsy
is recommended.

RECOMMENDATION:
Ultrasound-guided core needle biopsy right breast mass. This is
scheduled for July 15, 2013.

I have discussed the findings and recommendations with the patient.
Results were also provided in writing at the conclusion of the
visit.

BI-RADS CATEGORY  4: Suspicious abnormality - biopsy should be
considered.

## 2015-02-10 IMAGING — MG DIGITAL DIAGNOSTIC UNILATERAL RIGHT MAMMOGRAM
2 series · 2 of 2 positions shown · non-contrast
Comparison: Previous exams

CLINICAL DATA: Status post ultrasound-guided core needle biopsy of
a 7 mm 1 o'clock right breast mass.

EXAM:
DIAGNOSTIC RIGHT MAMMOGRAM POST ULTRASOUND BIOPSY

[R CC]
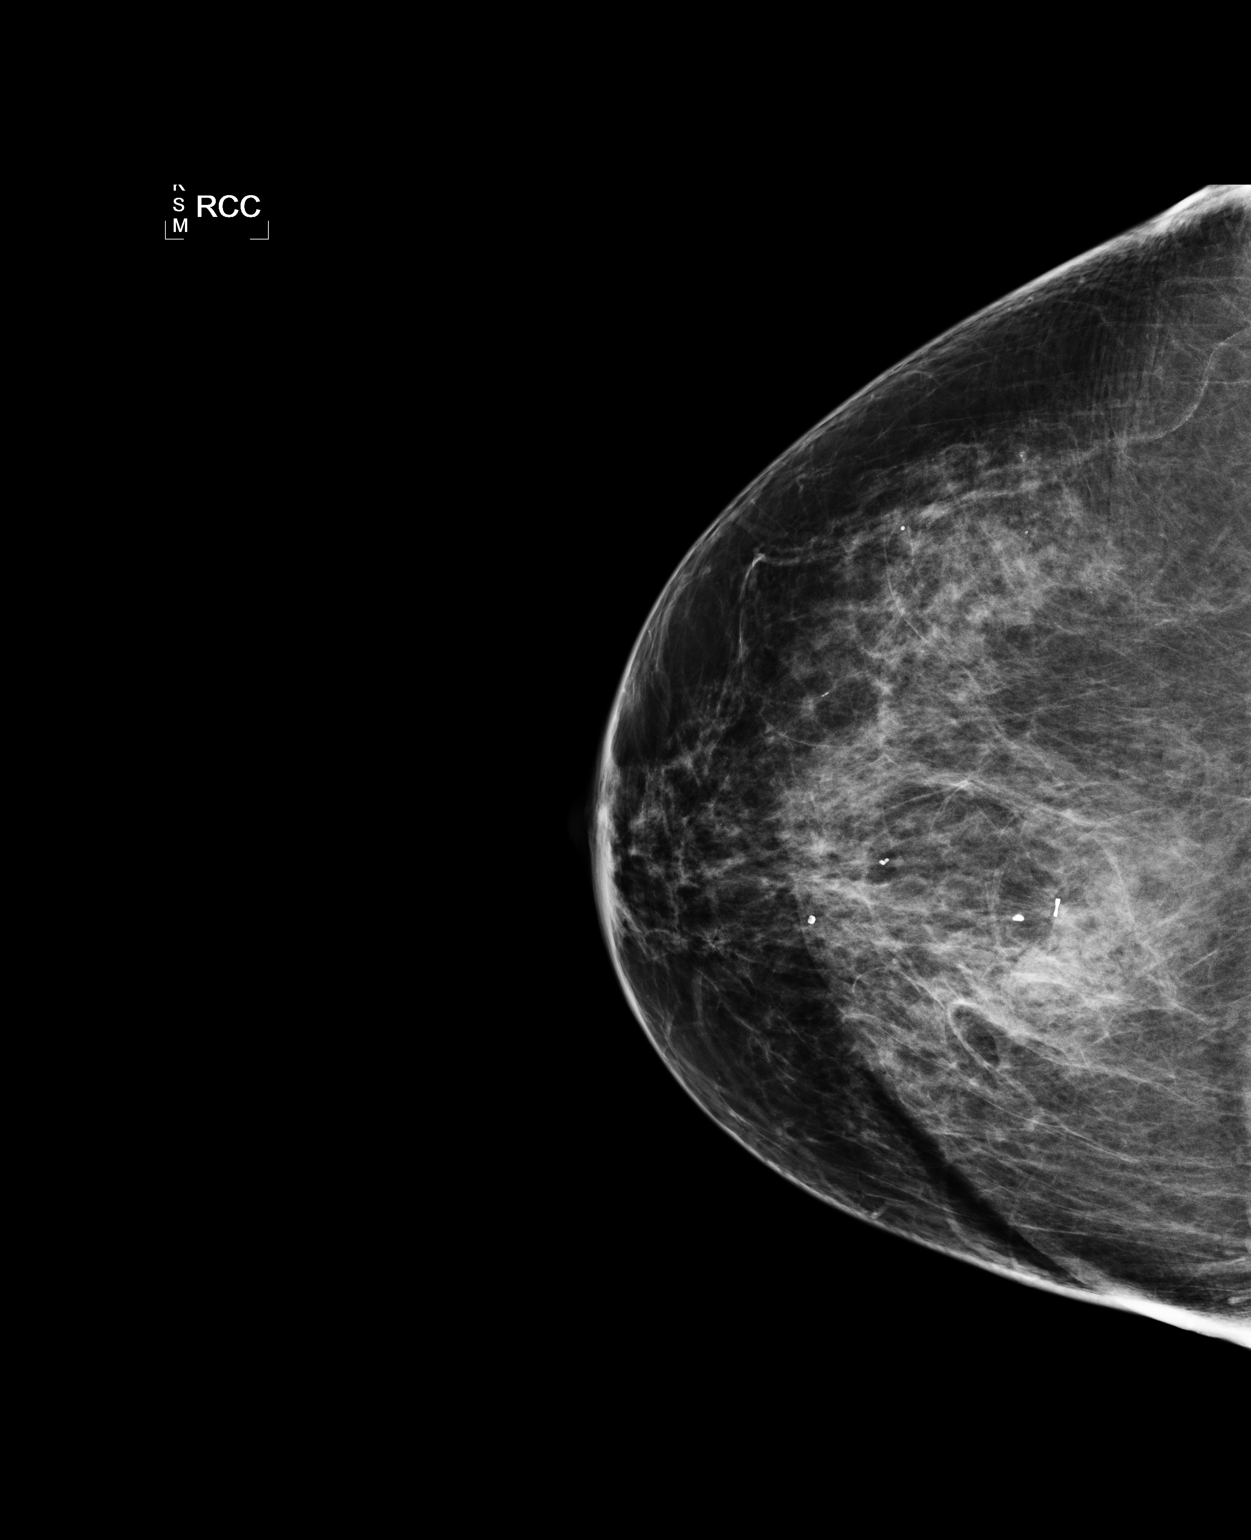

[R ML]
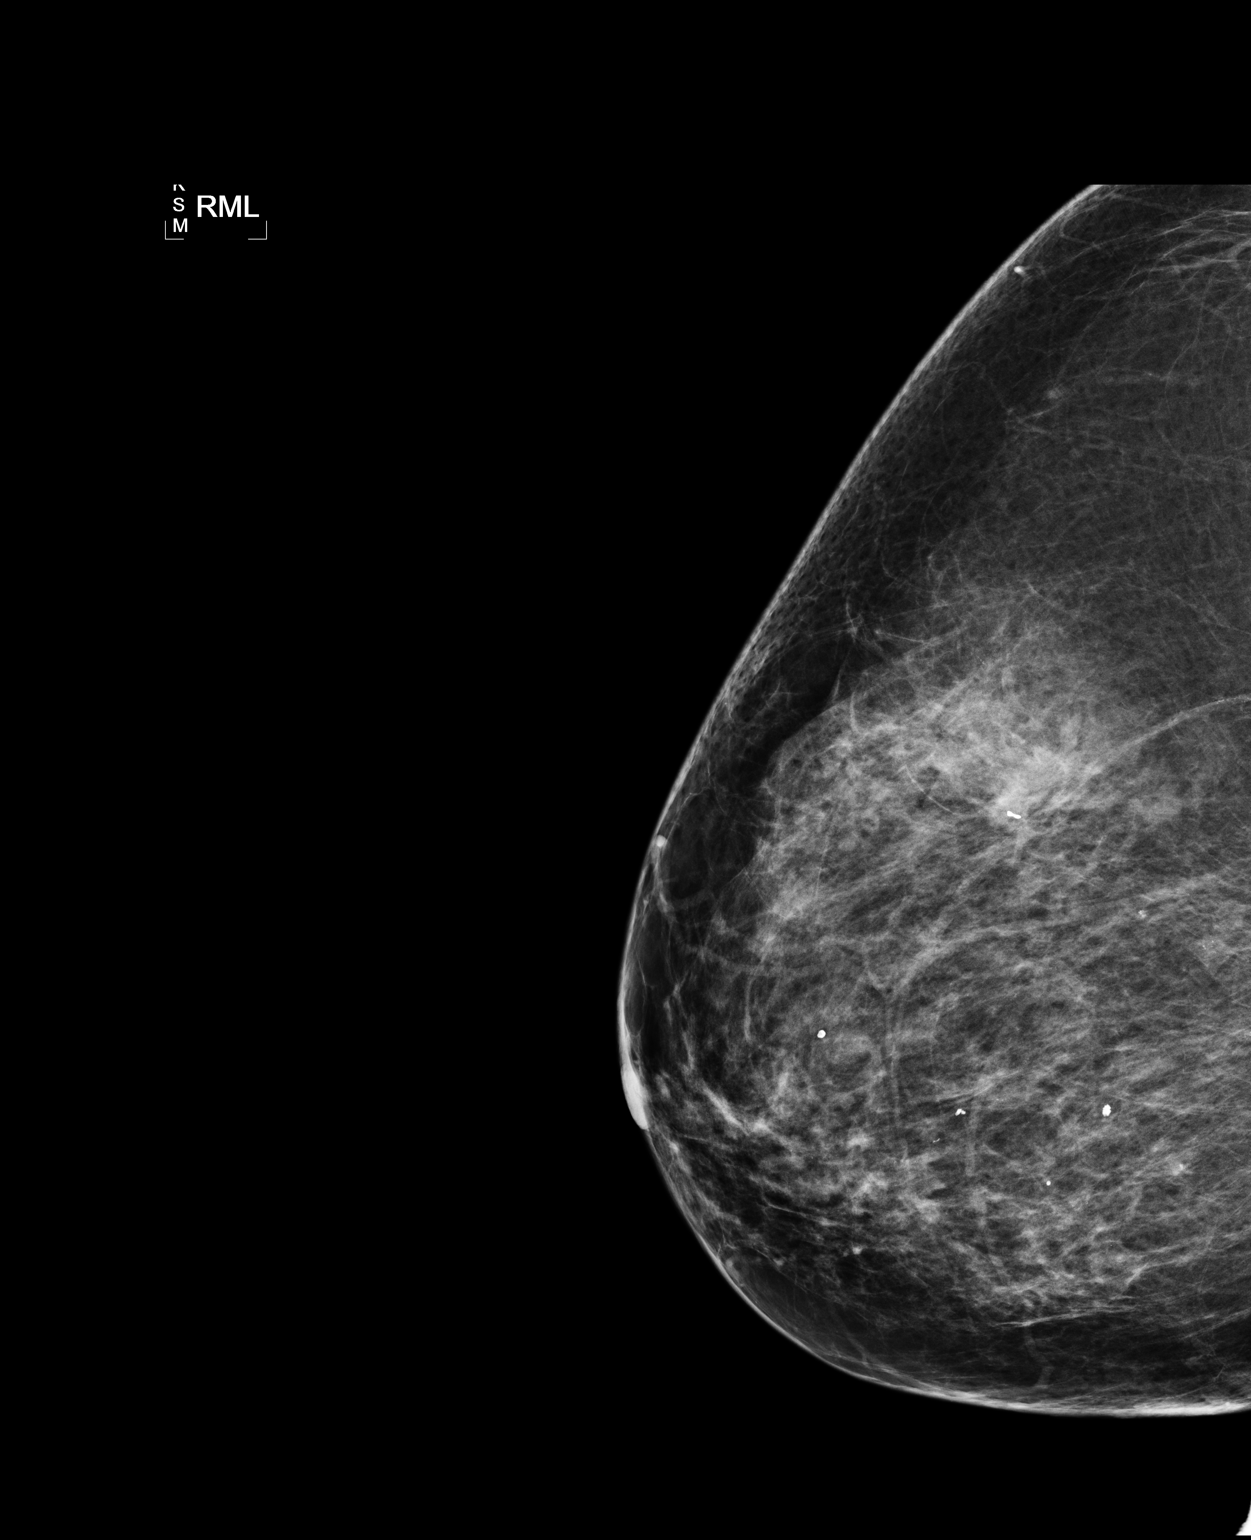

[2 of 2 positions shown; findings below may reference images not displayed]

FINDINGS: Mammographic images were obtained following ultrasound guided biopsy
of a 7 mm mass in the 1 o'clock position of the right breast. These
demonstrate a bow tie shaped biopsy marker clip at the location of
the biopsied mass.
IMPRESSION: Appropriate clip deployment following right breast ultrasound-guided
core needle biopsy.

Final Assessment: Post Procedure Mammograms for Marker Placement

## 2015-04-11 ENCOUNTER — Other Ambulatory Visit: Payer: Self-pay | Admitting: Hematology and Oncology

## 2015-04-12 NOTE — Telephone Encounter (Signed)
Las ov 11/30/14.  Next ov 05/30/15.  Chart reviewed.

## 2015-05-29 NOTE — Assessment & Plan Note (Signed)
Right breast invasive lobular carcinoma and LCIS diagnosed December 2014 yard 100%, PR percent, HER-2 negative, Ki-67 25% status post right lumpectomy 1.1 cm tumor 2 SLN negative status post radiation therapy and started to Arimidex April 2015.  Arimidex toxicities: No major side effects to Arimidex  Normal bone density 08/28/2013 T score -0.4  Breast Cancer Surveillance: 1. Breast exam 11/30/2014: Normal 2. Mammogram 07/07/2014 No abnormalities. Postsurgical changes. Breast Density Category B. I recommended that she get 3-D mammograms for surveillance. Discussed the differences between different breast density categories.  Right knee arthritis: Because of this he is unable to do much physical activity. She is able to walk but cannot do much exercise. Return to clinic in 6 months for follow-up 

## 2015-05-30 ENCOUNTER — Ambulatory Visit (HOSPITAL_BASED_OUTPATIENT_CLINIC_OR_DEPARTMENT_OTHER): Payer: Medicare Other | Admitting: Hematology and Oncology

## 2015-05-30 ENCOUNTER — Encounter: Payer: Self-pay | Admitting: Hematology and Oncology

## 2015-05-30 VITALS — BP 156/54 | HR 69 | Temp 98.8°F | Resp 18 | Ht 63.5 in | Wt 185.5 lb

## 2015-05-30 DIAGNOSIS — C50211 Malignant neoplasm of upper-inner quadrant of right female breast: Secondary | ICD-10-CM

## 2015-05-30 DIAGNOSIS — Z923 Personal history of irradiation: Secondary | ICD-10-CM | POA: Diagnosis not present

## 2015-05-30 DIAGNOSIS — Z79811 Long term (current) use of aromatase inhibitors: Secondary | ICD-10-CM | POA: Diagnosis not present

## 2015-05-30 DIAGNOSIS — Z170 Estrogen receptor positive status [ER+]: Secondary | ICD-10-CM | POA: Diagnosis not present

## 2015-05-30 NOTE — Progress Notes (Signed)
Patient Care Team: Lavone Orn, MD as PCP - General (Internal Medicine)  DIAGNOSIS: Breast cancer of upper-inner quadrant of right female breast [facility])   Staging form: Breast, AJCC 7th Edition     Clinical: Stage IA (T1c, N0, cM0) - Unsigned       Staging comments: Staged at breast conference 07/22/13.      Pathologic: No stage assigned - Unsigned   SUMMARY OF ONCOLOGIC HISTORY:   Breast cancer of upper-inner quadrant of right female breast ([place])   08/11/2013 Surgery Right breast lumpectomy: Invasive lobular cancer, grade 2, 1.1 cm, with LCIS, 0/2 lymph nodes, T1 cN0 M0 stage IA, ER 100%, PR 100%, HER-2 -1.4 ratio, Ki-67 25%   09/11/2013 - 10/09/2013 Radiation Therapy Adjuvant radiation therapy   10/13/2013 -  Anti-estrogen oral therapy Anastrozole 1 mg daily 5 years    CHIEF COMPLIANT: Follow-up on anastrozole  INTERVAL HISTORY: Laurie SCHILLER is a 81 year old with above-mentioned history of right-sided breast cancer currently on anastrozole. She is tolerating extremely well without any major problems or concerns. Denies any hot flashes or myalgias. She is able to exercise with the help of a dog. Denies any lumps or nodules in the breasts.  REVIEW OF SYSTEMS:   Constitutional: Denies fevers, chills or abnormal weight loss Eyes: Denies blurriness of vision Ears, nose, mouth, throat, and face: Denies mucositis or sore throat Respiratory: Denies cough, dyspnea or wheezes Cardiovascular: Denies palpitation, chest discomfort or lower extremity swelling Gastrointestinal:  Denies nausea, heartburn or change in bowel habits Skin: Denies abnormal skin rashes Lymphatics: Denies new lymphadenopathy or easy bruising Neurological:Denies numbness, tingling or new weaknesses Behavioral/Psych: Mood is stable, no new changes  Breast:  denies any pain or lumps or nodules in either breasts All other systems were reviewed with the patient and are negative.  I have reviewed the past medical history,  past surgical history, social history and family history with the patient and they are unchanged from previous note.  ALLERGIES:  is allergic to codeine and sulfa antibiotics.  MEDICATIONS:  Current Outpatient Prescriptions  Medication Sig Dispense Refill  . acetaminophen (TYLENOL) 325 MG tablet Take 650 mg by mouth every 6 (six) hours as needed.    Marland Kitchen amLODipine (NORVASC) 10 MG tablet Take 10 mg by mouth daily.    Marland Kitchen anastrozole (ARIMIDEX) 1 MG tablet Take 1 mg by mouth daily.    Marland Kitchen anastrozole (ARIMIDEX) 1 MG tablet TAKE 1 TABLET BY MOUTH EVERY DAY 90 tablet 1  . aspirin 81 MG tablet Take 81 mg by mouth daily.    Marland Kitchen bismuth subsalicylate (PEPTO BISMOL) 262 MG chewable tablet Chew 524 mg by mouth as needed.    . carboxymethylcellulose (REFRESH PLUS) 0.5 % SOLN 1 drop 3 (three) times daily as needed.    . carvedilol (COREG) 25 MG tablet Take 25 mg by mouth 2 (two) times daily with a meal.    . cycloSPORINE (RESTASIS) 0.05 % ophthalmic emulsion 1 drop 2 (two) times daily.    Marland Kitchen MELATONIN PO Take 2.5 mg by mouth. 1/2 tablet once a day    . metFORMIN (GLUCOPHAGE) 500 MG tablet Take 500 mg by mouth 2 (two) times daily with a meal. 3 tablets in am, 2 tablets in evening    . Multiple Vitamin (MULTI-VITAMIN DAILY PO) Take by mouth.    Marland Kitchen omeprazole (PRILOSEC) 20 MG capsule Take 20 mg by mouth daily.    . simvastatin (ZOCOR) 20 MG tablet Take 20 mg by mouth daily at 6  PM.     . telmisartan-hydrochlorothiazide (MICARDIS HCT) 40-12.5 MG per tablet Take 1 tablet by mouth daily.    . vitamin B-12 (CYANOCOBALAMIN) 100 MCG tablet Take 100 mcg by mouth daily.    Marland Kitchen VITAMIN D, ERGOCALCIFEROL, PO Take by mouth.     No current facility-administered medications for this visit.    PHYSICAL EXAMINATION: ECOG PERFORMANCE STATUS: 0 - Asymptomatic  Filed Vitals:   05/30/15 1534  BP: 156/54  Pulse: 69  Temp: 98.8 F (37.1 C)  Resp: 18   Filed Weights   05/30/15 1534  Weight: 185 lb 8 oz (84.142 kg)     GENERAL:alert, no distress and comfortable SKIN: skin color, texture, turgor are normal, no rashes or significant lesions EYES: normal, Conjunctiva are pink and non-injected, sclera clear OROPHARYNX:no exudate, no erythema and lips, buccal mucosa, and tongue normal  NECK: supple, thyroid normal size, non-tender, without nodularity LYMPH:  no palpable lymphadenopathy in the cervical, axillary or inguinal LUNGS: clear to auscultation and percussion with normal breathing effort HEART: regular rate & rhythm and no murmurs and no lower extremity edema ABDOMEN:abdomen soft, non-tender and normal bowel sounds Musculoskeletal:no cyanosis of digits and no clubbing  NEURO: alert & oriented x 3 with fluent speech, no focal motor/sensory deficits BREAST: No palpable masses or nodules in either right or left breasts. No palpable axillary supraclavicular or infraclavicular adenopathy no breast tenderness or nipple discharge. (exam performed in the presence of a chaperone)  LABORATORY DATA:  I have reviewed the data as listed   Chemistry      Component Value Date/Time   NA 144 11/30/2014 1504   K 4.0 11/30/2014 1504   CO2 24 11/30/2014 1504   BUN 18.9 11/30/2014 1504   CREATININE 0.8 11/30/2014 1504      Component Value Date/Time   CALCIUM 10.0 11/30/2014 1504   ALKPHOS 68 11/30/2014 1504   AST 11 11/30/2014 1504   ALT 11 11/30/2014 1504   BILITOT 0.26 11/30/2014 1504       Lab Results  Component Value Date   WBC 5.1 11/30/2014   HGB 11.7 11/30/2014   HCT 35.5 11/30/2014   MCV 84.7 11/30/2014   PLT 207 11/30/2014   NEUTROABS 3.3 11/30/2014   ASSESSMENT & PLAN:  Breast cancer of upper-inner quadrant of right female breast Right breast invasive lobular carcinoma and LCIS diagnosed December 2014 yard 100%, PR percent, HER-2 negative, Ki-67 25% status post right lumpectomy 1.1 cm tumor 2 SLN negative status post radiation therapy and started to Arimidex April 2015.  Arimidex  toxicities: No major side effects to Arimidex  Normal bone density 08/28/2013 T score -0.4  Breast Cancer Surveillance: 1. Breast exam 05/30/2015: Normal 2. Mammogram 07/07/2014 No abnormalities. Postsurgical changes. Breast Density Category B. I recommended that she get 3-D mammograms for surveillance. Discussed the differences between different breast density categories.  Right knee arthritis: Because of this he is unable to do much physical activity. She is able to walk but cannot do much exercise. Return to clinic in 6 months for follow-up and after that we will see her once a year   No orders of the defined types were placed in this encounter.   The patient has a good understanding of the overall plan. she agrees with it. she will call with any problems that may develop before the next visit here.   Rulon Eisenmenger, MD 05/30/2015

## 2015-05-31 ENCOUNTER — Telehealth: Payer: Self-pay | Admitting: Hematology and Oncology

## 2015-05-31 NOTE — Telephone Encounter (Signed)
lvm for pt regarding to April 2017 appt.Marland KitchenMarland KitchenMarland KitchenMarland Kitchenpt ok and aware

## 2015-06-16 ENCOUNTER — Other Ambulatory Visit: Payer: Self-pay | Admitting: Hematology and Oncology

## 2015-06-16 DIAGNOSIS — Z853 Personal history of malignant neoplasm of breast: Secondary | ICD-10-CM

## 2015-07-11 ENCOUNTER — Ambulatory Visit
Admission: RE | Admit: 2015-07-11 | Discharge: 2015-07-11 | Disposition: A | Discharge status: Home / Self Care | Payer: Medicare Other | Source: Ambulatory Visit | Attending: Hematology and Oncology | Admitting: Hematology and Oncology

## 2015-07-11 ENCOUNTER — Other Ambulatory Visit: Payer: Self-pay | Admitting: Hematology and Oncology

## 2015-07-11 DIAGNOSIS — Z853 Personal history of malignant neoplasm of breast: Secondary | ICD-10-CM

## 2015-07-18 ENCOUNTER — Ambulatory Visit
Admission: RE | Admit: 2015-07-18 | Discharge: 2015-07-18 | Disposition: A | Discharge status: Home / Self Care | Payer: Medicare Other | Source: Ambulatory Visit | Attending: Hematology and Oncology | Admitting: Hematology and Oncology

## 2015-07-18 ENCOUNTER — Other Ambulatory Visit: Payer: Self-pay | Admitting: Hematology and Oncology

## 2015-07-18 DIAGNOSIS — Z853 Personal history of malignant neoplasm of breast: Secondary | ICD-10-CM

## 2015-10-11 ENCOUNTER — Other Ambulatory Visit: Payer: Self-pay | Admitting: Hematology and Oncology

## 2015-10-11 DIAGNOSIS — C50211 Malignant neoplasm of upper-inner quadrant of right female breast: Secondary | ICD-10-CM

## 2015-11-28 ENCOUNTER — Ambulatory Visit (HOSPITAL_BASED_OUTPATIENT_CLINIC_OR_DEPARTMENT_OTHER): Payer: Medicare Other | Admitting: Hematology and Oncology

## 2015-11-28 ENCOUNTER — Encounter: Payer: Self-pay | Admitting: Hematology and Oncology

## 2015-11-28 ENCOUNTER — Telehealth: Payer: Self-pay | Admitting: Hematology and Oncology

## 2015-11-28 VITALS — BP 177/52 | HR 65 | Temp 98.0°F | Resp 18 | Ht 63.5 in | Wt 188.4 lb

## 2015-11-28 DIAGNOSIS — C50211 Malignant neoplasm of upper-inner quadrant of right female breast: Secondary | ICD-10-CM

## 2015-11-28 DIAGNOSIS — Z79811 Long term (current) use of aromatase inhibitors: Secondary | ICD-10-CM

## 2015-11-28 DIAGNOSIS — Z170 Estrogen receptor positive status [ER+]: Secondary | ICD-10-CM | POA: Diagnosis not present

## 2015-11-28 NOTE — Telephone Encounter (Signed)
appt made and avs printed °

## 2015-11-28 NOTE — Progress Notes (Signed)
Patient Care Team: Laurie Orn, MD as PCP - General (Internal Medicine)  DIAGNOSIS: Breast cancer of upper-inner quadrant of right female breast [facility])   Staging form: Breast, AJCC 7th Edition     Clinical: Stage IA (T1c, N0, cM0) - Unsigned       Staging comments: Staged at breast conference 07/22/13.      Pathologic: No stage assigned - Unsigned   SUMMARY OF ONCOLOGIC HISTORY:   Breast cancer of upper-inner quadrant of right female breast ([place])   08/11/2013 Surgery Right breast lumpectomy: Invasive lobular cancer, grade 2, 1.1 cm, with LCIS, 0/2 lymph nodes, T1 cN0 M0 stage IA, ER 100%, PR 100%, HER-2 -1.4 ratio, Ki-67 25%   09/11/2013 - 10/09/2013 Radiation Therapy Adjuvant radiation therapy   10/13/2013 -  Anti-estrogen oral therapy Anastrozole 1 mg daily 5 years    CHIEF COMPLIANT:  Follow-up on anastrozole  INTERVAL HISTORY: Laurie Fox is a  82 year old with above-mentioned history of right breast cancer treated with lumpectomy and radiation and is currently on anastrozole since March 2015. She appears to be tolerating massive extremely well and denies any hot flashes or myalgias. Recently she stumbled and fell on the concrete after toppling a few times. It appears that she did not break any bones. She is still recovering from that Fall and injury.  REVIEW OF SYSTEMS:   Constitutional: Denies fevers, chills or abnormal weight loss Eyes: Denies blurriness of vision Ears, nose, mouth, throat, and face: Denies mucositis or sore throat Respiratory: Denies cough, dyspnea or wheezes Cardiovascular: Denies palpitation, chest discomfort Gastrointestinal:  Denies nausea, heartburn or change in bowel habits Skin: Denies abnormal skin rashes Lymphatics: Denies new lymphadenopathy or easy bruising Neurological:Denies numbness, tingling or new weaknesses Behavioral/Psych: Mood is stable, no new changes  Extremities: No lower extremity edema Breast:  denies any pain or lumps or nodules  in either breasts All other systems were reviewed with the patient and are negative.  I have reviewed the past medical history, past surgical history, social history and family history with the patient and they are unchanged from previous note.  ALLERGIES:  is allergic to codeine and sulfa antibiotics.  MEDICATIONS:  Current Outpatient Prescriptions  Medication Sig Dispense Refill  . acetaminophen (TYLENOL) 325 MG tablet Take 650 mg by mouth every 6 (six) hours as needed.    Marland Kitchen amLODipine (NORVASC) 10 MG tablet Take 10 mg by mouth daily.    Marland Kitchen anastrozole (ARIMIDEX) 1 MG tablet Take 1 mg by mouth daily.    Marland Kitchen anastrozole (ARIMIDEX) 1 MG tablet TAKE 1 TABLET BY MOUTH EVERY DAY 90 tablet 3  . aspirin 81 MG tablet Take 81 mg by mouth daily.    Marland Kitchen bismuth subsalicylate (PEPTO BISMOL) 262 MG chewable tablet Chew 524 mg by mouth as needed.    . carboxymethylcellulose (REFRESH PLUS) 0.5 % SOLN 1 drop 3 (three) times daily as needed.    . carvedilol (COREG) 25 MG tablet Take 25 mg by mouth 2 (two) times daily with a meal.    . cycloSPORINE (RESTASIS) 0.05 % ophthalmic emulsion 1 drop 2 (two) times daily.    Marland Kitchen MELATONIN PO Take 2.5 mg by mouth. 1/2 tablet once a day    . metFORMIN (GLUCOPHAGE) 500 MG tablet Take 500 mg by mouth 2 (two) times daily with a meal. 3 tablets in am, 2 tablets in evening    . Multiple Vitamin (MULTI-VITAMIN DAILY PO) Take by mouth.    Marland Kitchen omeprazole (PRILOSEC) 20 MG  capsule Take 20 mg by mouth daily.    . simvastatin (ZOCOR) 20 MG tablet Take 20 mg by mouth daily at 6 PM.     . telmisartan-hydrochlorothiazide (MICARDIS HCT) 40-12.5 MG per tablet Take 1 tablet by mouth daily.    . vitamin B-12 (CYANOCOBALAMIN) 100 MCG tablet Take 100 mcg by mouth daily.    Marland Kitchen VITAMIN D, ERGOCALCIFEROL, PO Take by mouth.     No current facility-administered medications for this visit.    PHYSICAL EXAMINATION: ECOG PERFORMANCE STATUS: 0 - Asymptomatic  Filed Vitals:   11/28/15 1513  BP:  177/52  Pulse: 65  Temp: 98 F (36.7 C)  Resp: 18   Filed Weights   11/28/15 1513  Weight: 188 lb 6.4 oz (85.458 kg)    GENERAL:alert, no distress and comfortable SKIN: skin color, texture, turgor are normal, no rashes or significant lesions EYES: normal, Conjunctiva are pink and non-injected, sclera clear OROPHARYNX:no exudate, no erythema and lips, buccal mucosa, and tongue normal  NECK: supple, thyroid normal size, non-tender, without nodularity LYMPH:  no palpable lymphadenopathy in the cervical, axillary or inguinal LUNGS: clear to auscultation and percussion with normal breathing effort HEART: regular rate & rhythm and no murmurs and no lower extremity edema ABDOMEN:abdomen soft, non-tender and normal bowel sounds MUSCULOSKELETAL:no cyanosis of digits and no clubbing  NEURO: alert & oriented x 3 with fluent speech, no focal motor/sensory deficits EXTREMITIES: No lower extremity edema BREAST: No palpable masses or nodules in either right or left breasts. No palpable axillary supraclavicular or infraclavicular adenopathy no breast tenderness or nipple discharge. (exam performed in the presence of a chaperone)  LABORATORY DATA:  I have reviewed the data as listed   Chemistry      Component Value Date/Time   NA 144 11/30/2014 1504   K 4.0 11/30/2014 1504   CO2 24 11/30/2014 1504   BUN 18.9 11/30/2014 1504   CREATININE 0.8 11/30/2014 1504      Component Value Date/Time   CALCIUM 10.0 11/30/2014 1504   ALKPHOS 68 11/30/2014 1504   AST 11 11/30/2014 1504   ALT 11 11/30/2014 1504   BILITOT 0.26 11/30/2014 1504       Lab Results  Component Value Date   WBC 5.1 11/30/2014   HGB 11.7 11/30/2014   HCT 35.5 11/30/2014   MCV 84.7 11/30/2014   PLT 207 11/30/2014   NEUTROABS 3.3 11/30/2014   ASSESSMENT & PLAN:  Breast cancer of upper-inner quadrant of right female breast Right breast invasive lobular carcinoma and LCIS diagnosed December 2014 yard 100%, PR percent,  HER-2 negative, Ki-67 25% status post right lumpectomy 1.1 cm tumor 2 SLN negative status post radiation therapy and started to Arimidex April 2015.  Arimidex toxicities: No major side effects to Arimidex  Normal bone density 08/28/2013 T score -0.4, She does not want to do the bone density this year. She will do it next year.  Breast Cancer Surveillance: 1. Breast exam 11/28/2015: Normal 2. Mammogram 07/18/2015 stereotactic biopsy of right breast calcifications revealed fat necrosis.  Right knee arthritis: Because of this she is unable to do much physical activity. Return to clinic in 1 year  No orders of the defined types were placed in this encounter.   The patient has a good understanding of the overall plan. she agrees with it. she will call with any problems that may develop before the next visit here.   Rulon Eisenmenger, MD 11/28/2015

## 2015-11-28 NOTE — Assessment & Plan Note (Signed)
Right breast invasive lobular carcinoma and LCIS diagnosed December 2014 yard 100%, PR percent, HER-2 negative, Ki-67 25% status post right lumpectomy 1.1 cm tumor 2 SLN negative status post radiation therapy and started to Arimidex April 2015.  Arimidex toxicities: No major side effects to Arimidex  Normal bone density 08/28/2013 T score -0.4, recommended another bone density to be done this year.  Breast Cancer Surveillance: 1. Breast exam 11/28/2015: Normal 2. Mammogram 07/18/2015 stereotactic biopsy of right breast calcifications revealed fat necrosis.  Right knee arthritis: Because of this he is unable to do much physical activity. She is able to walk but cannot do much exercise. Return to clinic in 1 year

## 2016-03-07 ENCOUNTER — Other Ambulatory Visit: Payer: Self-pay | Admitting: Hematology and Oncology

## 2016-03-07 DIAGNOSIS — R921 Mammographic calcification found on diagnostic imaging of breast: Secondary | ICD-10-CM

## 2016-03-21 ENCOUNTER — Ambulatory Visit
Admission: RE | Admit: 2016-03-21 | Discharge: 2016-03-21 | Disposition: A | Discharge status: Home / Self Care | Payer: Medicare Other | Source: Ambulatory Visit | Attending: Hematology and Oncology | Admitting: Hematology and Oncology

## 2016-03-21 ENCOUNTER — Other Ambulatory Visit: Payer: Self-pay | Admitting: Hematology and Oncology

## 2016-03-21 DIAGNOSIS — R921 Mammographic calcification found on diagnostic imaging of breast: Secondary | ICD-10-CM

## 2016-05-24 ENCOUNTER — Other Ambulatory Visit: Payer: Self-pay

## 2016-05-24 DIAGNOSIS — C50211 Malignant neoplasm of upper-inner quadrant of right female breast: Secondary | ICD-10-CM

## 2016-05-24 MED ORDER — ANASTROZOLE 1 MG PO TABS: 1.0000 mg | ORAL_TABLET | Freq: Every day | ORAL | 3 refills | Status: DC

## 2016-07-11 ENCOUNTER — Other Ambulatory Visit: Payer: Self-pay | Admitting: Hematology and Oncology

## 2016-07-11 DIAGNOSIS — Z853 Personal history of malignant neoplasm of breast: Secondary | ICD-10-CM

## 2016-08-01 ENCOUNTER — Ambulatory Visit
Admission: RE | Admit: 2016-08-01 | Discharge: 2016-08-01 | Disposition: A | Discharge status: Home / Self Care | Payer: Medicare Other | Source: Ambulatory Visit | Attending: Hematology and Oncology | Admitting: Hematology and Oncology

## 2016-08-01 DIAGNOSIS — Z853 Personal history of malignant neoplasm of breast: Secondary | ICD-10-CM

## 2016-11-27 ENCOUNTER — Ambulatory Visit (HOSPITAL_BASED_OUTPATIENT_CLINIC_OR_DEPARTMENT_OTHER): Payer: Medicare Other | Admitting: Hematology and Oncology

## 2016-11-27 ENCOUNTER — Encounter: Payer: Self-pay | Admitting: Hematology and Oncology

## 2016-11-27 DIAGNOSIS — Z170 Estrogen receptor positive status [ER+]: Secondary | ICD-10-CM

## 2016-11-27 DIAGNOSIS — Z79811 Long term (current) use of aromatase inhibitors: Secondary | ICD-10-CM | POA: Diagnosis not present

## 2016-11-27 DIAGNOSIS — C50211 Malignant neoplasm of upper-inner quadrant of right female breast: Secondary | ICD-10-CM | POA: Diagnosis not present

## 2016-11-27 MED ORDER — ANASTROZOLE 1 MG PO TABS: 1.0000 mg | ORAL_TABLET | Freq: Every day | ORAL | 3 refills | Status: DC

## 2016-11-27 NOTE — Progress Notes (Signed)
Patient Care Team: Lavone Orn, MD as PCP - General (Internal Medicine)  DIAGNOSIS:  Encounter Diagnosis  Name Primary?  . Malignant neoplasm of upper-inner quadrant of right breast in female, estrogen receptor positive ([place])     SUMMARY OF ONCOLOGIC HISTORY:   Breast cancer of upper-inner quadrant of right female breast ([place])   08/11/2013 Surgery    Right breast lumpectomy: Invasive lobular cancer, grade 2, 1.1 cm, with LCIS, 0/2 lymph nodes, T1 cN0 M0 stage IA, ER 100%, PR 100%, HER-2 -1.4 ratio, Ki-67 25%      09/11/2013 - 10/09/2013 Radiation Therapy    Adjuvant radiation therapy      10/13/2013 -  Anti-estrogen oral therapy    Anastrozole 1 mg daily 5 years       CHIEF COMPLIANT: Follow-up on Arimidex therapy  INTERVAL HISTORY: Laurie Fox is a 83 year old with above-mentioned history of right breast cancer treated with lumpectomy and radiation is currently on anastrozole therapy since 2015. She's been on for the past 3 years and appears to be tolerating extremely well. Denies hot flashes or myalgias. She has had allergy symptoms recently.  REVIEW OF SYSTEMS:   Constitutional: Denies fevers, chills or abnormal weight loss Eyes: Denies blurriness of vision Ears, nose, mouth, throat, and face: Denies mucositis or sore throat Respiratory: Denies cough, dyspnea or wheezes Cardiovascular: Denies palpitation, chest discomfort Gastrointestinal:  Denies nausea, heartburn or change in bowel habits Skin: Denies abnormal skin rashes Lymphatics: Denies new lymphadenopathy or easy bruising Neurological:Denies numbness, tingling or new weaknesses Behavioral/Psych: Mood is stable, no new changes  Extremities: No lower extremity edema Breast:  denies any pain or lumps or nodules in either breasts All other systems were reviewed with the patient and are negative.  I have reviewed the past medical history, past surgical history, social history and family history with the patient  and they are unchanged from previous note.  ALLERGIES:  is allergic to codeine and sulfa antibiotics.  MEDICATIONS:  Current Outpatient Prescriptions  Medication Sig Dispense Refill  . acetaminophen (TYLENOL) 325 MG tablet Take 650 mg by mouth every 6 (six) hours as needed.    Marland Kitchen amLODipine (NORVASC) 10 MG tablet Take 10 mg by mouth daily.    Marland Kitchen anastrozole (ARIMIDEX) 1 MG tablet Take 1 tablet (1 mg total) by mouth daily. 90 tablet 3  . aspirin 81 MG tablet Take 81 mg by mouth daily.    Marland Kitchen bismuth subsalicylate (PEPTO BISMOL) 262 MG chewable tablet Chew 524 mg by mouth as needed.    . carboxymethylcellulose (REFRESH PLUS) 0.5 % SOLN 1 drop 3 (three) times daily as needed.    . carvedilol (COREG) 25 MG tablet Take 25 mg by mouth 2 (two) times daily with a meal.    . cycloSPORINE (RESTASIS) 0.05 % ophthalmic emulsion 1 drop 2 (two) times daily.    Marland Kitchen MELATONIN PO Take 2.5 mg by mouth. 1/2 tablet once a day    . metFORMIN (GLUCOPHAGE) 500 MG tablet Take 500 mg by mouth 2 (two) times daily with a meal. 3 tablets in am, 2 tablets in evening    . Multiple Vitamin (MULTI-VITAMIN DAILY PO) Take by mouth.    Marland Kitchen omeprazole (PRILOSEC) 20 MG capsule Take 20 mg by mouth daily.    . simvastatin (ZOCOR) 20 MG tablet Take 20 mg by mouth daily at 6 PM.     . telmisartan-hydrochlorothiazide (MICARDIS HCT) 40-12.5 MG per tablet Take 1 tablet by mouth daily.    Marland Kitchen  vitamin B-12 (CYANOCOBALAMIN) 100 MCG tablet Take 100 mcg by mouth daily.    Marland Kitchen VITAMIN D, ERGOCALCIFEROL, PO Take by mouth.     No current facility-administered medications for this visit.     PHYSICAL EXAMINATION: ECOG PERFORMANCE STATUS: 0 - Asymptomatic  Vitals:   11/27/16 1343  BP: (!) 159/60  Pulse: 68  Resp: 18  Temp: 98.1 F (36.7 C)   Filed Weights   11/27/16 1343  Weight: 188 lb 6.4 oz (85.5 kg)    GENERAL:alert, no distress and comfortable SKIN: skin color, texture, turgor are normal, no rashes or significant lesions EYES:  normal, Conjunctiva are pink and non-injected, sclera clear OROPHARYNX:no exudate, no erythema and lips, buccal mucosa, and tongue normal  NECK: supple, thyroid normal size, non-tender, without nodularity LYMPH:  no palpable lymphadenopathy in the cervical, axillary or inguinal LUNGS: clear to auscultation and percussion with normal breathing effort HEART: regular rate & rhythm and no murmurs and no lower extremity edema ABDOMEN:abdomen soft, non-tender and normal bowel sounds MUSCULOSKELETAL:no cyanosis of digits and no clubbing  NEURO: alert & oriented x 3 with fluent speech, no focal motor/sensory deficits EXTREMITIES: No lower extremity edema BREAST: Related to okay all No palpable masses or nodules in either right or left breasts. No palpable axillary supraclavicular or infraclavicular adenopathy no breast tenderness or nipple discharge. (exam performed in the presence of a chaperone)  LABORATORY DATA:  I have reviewed the data as listed   Chemistry      Component Value Date/Time   NA 144 11/30/2014 1504   K 4.0 11/30/2014 1504   CO2 24 11/30/2014 1504   BUN 18.9 11/30/2014 1504   CREATININE 0.8 11/30/2014 1504      Component Value Date/Time   CALCIUM 10.0 11/30/2014 1504   ALKPHOS 68 11/30/2014 1504   AST 11 11/30/2014 1504   ALT 11 11/30/2014 1504   BILITOT 0.26 11/30/2014 1504       Lab Results  Component Value Date   WBC 5.1 11/30/2014   HGB 11.7 11/30/2014   HCT 35.5 11/30/2014   MCV 84.7 11/30/2014   PLT 207 11/30/2014   NEUTROABS 3.3 11/30/2014    ASSESSMENT & PLAN:  Breast cancer of upper-inner quadrant of right female breast Right breast invasive lobular carcinoma and LCIS diagnosed December 2014 yard 100%, PR percent, HER-2 negative, Ki-67 25% status post right lumpectomy 1.1 cm tumor 2 SLN negative status post radiation therapy and started to Arimidex April 2015.  Arimidex toxicities: No major side effects to Arimidex  Normal bone density 08/28/2013 T  score -0.4, She does not want to do the bone density this year. She will do it next year.  Breast Cancer Surveillance: 1. Breast exam 11/27/2016: Normal 2. Mammogram 08/01/2016, breast density category C, stable scarring changes in the posterior superior central right breast no evidence of malignancy.Previous stereotactic biopsy of right breast calcifications revealed fat necrosis.  Right knee arthritis: Because of this she is unable to do much physical activity. Return to clinic in 1 year   I spent 15 minutes talking to the patient of which more than half was spent in counseling and coordination of care.  No orders of the defined types were placed in this encounter.  The patient has a good understanding of the overall plan. she agrees with it. she will call with any problems that may develop before the next visit here.   Rulon Eisenmenger, MD 11/27/16

## 2016-11-27 NOTE — Assessment & Plan Note (Signed)
Right breast invasive lobular carcinoma and LCIS diagnosed December 2014 yard 100%, PR percent, HER-2 negative, Ki-67 25% status post right lumpectomy 1.1 cm tumor 2 SLN negative status post radiation therapy and started to Arimidex April 2015.  Arimidex toxicities: No major side effects to Arimidex  Normal bone density 08/28/2013 T score -0.4, She does not want to do the bone density this year. She will do it next year.  Breast Cancer Surveillance: 1. Breast exam 11/27/2016: Normal 2. Mammogram 08/01/2016, breast density category C, stable scarring changes in the posterior superior central right breast no evidence of malignancy.Previous stereotactic biopsy of right breast calcifications revealed fat necrosis.  Right knee arthritis: Because of this she is unable to do much physical activity. Return to clinic in 1 year

## 2017-07-11 ENCOUNTER — Other Ambulatory Visit: Payer: Self-pay | Admitting: Hematology and Oncology

## 2017-07-11 DIAGNOSIS — N6310 Unspecified lump in the right breast, unspecified quadrant: Secondary | ICD-10-CM

## 2017-07-11 DIAGNOSIS — Z9889 Other specified postprocedural states: Secondary | ICD-10-CM

## 2017-08-02 ENCOUNTER — Ambulatory Visit
Admission: RE | Admit: 2017-08-02 | Discharge: 2017-08-02 | Disposition: A | Discharge status: Home / Self Care | Payer: Medicare Other | Source: Ambulatory Visit | Attending: Hematology and Oncology | Admitting: Hematology and Oncology

## 2017-08-02 DIAGNOSIS — Z9889 Other specified postprocedural states: Secondary | ICD-10-CM

## 2017-08-02 HISTORY — DX: Personal history of irradiation: Z92.3

## 2017-09-13 ENCOUNTER — Telehealth: Payer: Self-pay

## 2017-09-13 NOTE — Telephone Encounter (Signed)
Spoke with patient concerning upcoming appointment changes, mail a letter also. Per 2/8 rescheduling

## 2017-11-27 ENCOUNTER — Other Ambulatory Visit: Payer: Self-pay | Admitting: Hematology and Oncology

## 2017-11-27 DIAGNOSIS — C50211 Malignant neoplasm of upper-inner quadrant of right female breast: Secondary | ICD-10-CM

## 2017-11-28 ENCOUNTER — Ambulatory Visit: Payer: Medicare Other | Admitting: Hematology and Oncology

## 2017-12-09 ENCOUNTER — Telehealth: Payer: Self-pay | Admitting: Hematology and Oncology

## 2017-12-09 ENCOUNTER — Inpatient Hospital Stay: Payer: Medicare Other | Attending: Hematology and Oncology | Admitting: Hematology and Oncology

## 2017-12-09 DIAGNOSIS — C50211 Malignant neoplasm of upper-inner quadrant of right female breast: Secondary | ICD-10-CM | POA: Insufficient documentation

## 2017-12-09 DIAGNOSIS — Z170 Estrogen receptor positive status [ER+]: Secondary | ICD-10-CM | POA: Diagnosis not present

## 2017-12-09 DIAGNOSIS — Z79811 Long term (current) use of aromatase inhibitors: Secondary | ICD-10-CM | POA: Insufficient documentation

## 2017-12-09 MED ORDER — LIDOCAINE 5 % EX OINT: 1.0000 "application " | TOPICAL_OINTMENT | CUTANEOUS | 0 refills | Status: DC | PRN

## 2017-12-09 MED ORDER — ANASTROZOLE 1 MG PO TABS: 1.0000 mg | ORAL_TABLET | Freq: Every day | ORAL | 3 refills | Status: DC

## 2017-12-09 MED ORDER — TRIAMCINOLONE ACETONIDE 55 MCG/ACT NA AERO: 2.0000 | INHALATION_SPRAY | Freq: Every day | NASAL | 12 refills | Status: DC

## 2017-12-09 MED ORDER — BLUE-EMU SUPER STRENGTH EX CREA: 1.0000 "application " | TOPICAL_CREAM | CUTANEOUS | Status: DC | PRN

## 2017-12-09 NOTE — Progress Notes (Signed)
Patient Care Team: Lavone Orn, MD as PCP - General (Internal Medicine)  DIAGNOSIS:  Encounter Diagnosis  Name Primary?  . Malignant neoplasm of upper-inner quadrant of right female breast, unspecified estrogen receptor status ([place])     SUMMARY OF ONCOLOGIC HISTORY:   Breast cancer of upper-inner quadrant of right female breast ([place])   08/11/2013 Surgery    Right breast lumpectomy: Invasive lobular cancer, grade 2, 1.1 cm, with LCIS, 0/2 lymph nodes, T1 cN0 M0 stage IA, ER 100%, PR 100%, HER-2 -1.4 ratio, Ki-67 25%      09/11/2013 - 10/09/2013 Radiation Therapy    Adjuvant radiation therapy      10/13/2013 -  Anti-estrogen oral therapy    Anastrozole 1 mg daily 5 years       CHIEF COMPLIANT: Follow-up on anastrozole therapy  INTERVAL HISTORY: Laurie Fox is a 84 year old with above-mentioned history of right breast cancer treated with lumpectomy radiation is currently on anastrozole therapy.  She is tolerating anastrozole extremely well.  She does not have any hot flashes or myalgias.  She continues to have arthritis in the knee this limits her activities.  She denies any lumps or nodules in the breast.  Mammograms done in December 2018 were normal.  REVIEW OF SYSTEMS:   Constitutional: Denies fevers, chills or abnormal weight loss Eyes: Denies blurriness of vision Ears, nose, mouth, throat, and face: Denies mucositis or sore throat Respiratory: Denies cough, dyspnea or wheezes Cardiovascular: Denies palpitation, chest discomfort Gastrointestinal:  Denies nausea, heartburn or change in bowel habits Skin: Denies abnormal skin rashes Lymphatics: Denies new lymphadenopathy or easy bruising Neurological:Denies numbness, tingling or new weaknesses Behavioral/Psych: Mood is stable, no new changes  Extremities: No lower extremity edema Breast:  denies any pain or lumps or nodules in either breasts All other systems were reviewed with the patient and are negative.  I have  reviewed the past medical history, past surgical history, social history and family history with the patient and they are unchanged from previous note.  ALLERGIES:  is allergic to codeine and sulfa antibiotics.  MEDICATIONS:  Current Outpatient Medications  Medication Sig Dispense Refill  . acetaminophen (TYLENOL) 325 MG tablet Take 650 mg by mouth every 6 (six) hours as needed.    Marland Kitchen amLODipine (NORVASC) 10 MG tablet Take 10 mg by mouth daily.    Marland Kitchen anastrozole (ARIMIDEX) 1 MG tablet Take 1 tablet (1 mg total) by mouth daily. 90 tablet 3  . aspirin 81 MG tablet Take 81 mg by mouth daily.    Marland Kitchen bismuth subsalicylate (PEPTO BISMOL) 262 MG chewable tablet Chew 524 mg by mouth as needed.    . carboxymethylcellulose (REFRESH PLUS) 0.5 % SOLN 1 drop 3 (three) times daily as needed.    . carvedilol (COREG) 25 MG tablet Take 25 mg by mouth 2 (two) times daily with a meal.    . cycloSPORINE (RESTASIS) 0.05 % ophthalmic emulsion 1 drop 2 (two) times daily.    Marland Kitchen lidocaine (XYLOCAINE) 5 % ointment Apply 1 application topically as needed. 35.44 g 0  . Liniments (BLUE-EMU SUPER STRENGTH) CREA Apply 1 application topically as needed.    Marland Kitchen MELATONIN PO Take 2.5 mg by mouth. 1/2 tablet once a day    . metFORMIN (GLUCOPHAGE) 500 MG tablet Take 500 mg by mouth 2 (two) times daily with a meal. 3 tablets in am, 2 tablets in evening    . Multiple Vitamin (MULTI-VITAMIN DAILY PO) Take by mouth.    Marland Kitchen omeprazole (  PRILOSEC) 20 MG capsule Take 20 mg by mouth daily.    . simvastatin (ZOCOR) 20 MG tablet Take 20 mg by mouth daily at 6 PM.     . telmisartan-hydrochlorothiazide (MICARDIS HCT) 40-12.5 MG per tablet Take 1 tablet by mouth daily.    Marland Kitchen triamcinolone (NASACORT) 55 MCG/ACT AERO nasal inhaler Place 2 sprays into the nose daily. 1 Inhaler 12  . vitamin B-12 (CYANOCOBALAMIN) 100 MCG tablet Take 100 mcg by mouth daily.    Marland Kitchen VITAMIN D, ERGOCALCIFEROL, PO Take by mouth.     No current facility-administered  medications for this visit.     PHYSICAL EXAMINATION: ECOG PERFORMANCE STATUS: 1 - Symptomatic but completely ambulatory  Vitals:   12/09/17 1355  BP: (!) 164/57  Pulse: 61  Resp: 17  Temp: 98.2 F (36.8 C)  SpO2: 96%   Filed Weights   12/09/17 1355  Weight: 182 lb 1.6 oz (82.6 kg)    GENERAL:alert, no distress and comfortable SKIN: skin color, texture, turgor are normal, no rashes or significant lesions EYES: normal, Conjunctiva are pink and non-injected, sclera clear OROPHARYNX:no exudate, no erythema and lips, buccal mucosa, and tongue normal  NECK: supple, thyroid normal size, non-tender, without nodularity LYMPH:  no palpable lymphadenopathy in the cervical, axillary or inguinal LUNGS: clear to auscultation and percussion with normal breathing effort HEART: regular rate & rhythm and no murmurs and no lower extremity edema ABDOMEN:abdomen soft, non-tender and normal bowel sounds MUSCULOSKELETAL:no cyanosis of digits and no clubbing  NEURO: alert & oriented x 3 with fluent speech, no focal motor/sensory deficits EXTREMITIES: No lower extremity edema BREAST: No palpable masses or nodules in either right or left breasts. No palpable axillary supraclavicular or infraclavicular adenopathy no breast tenderness or nipple discharge. (exam performed in the presence of a chaperone)  LABORATORY DATA:  I have reviewed the data as listed CMP Latest Ref Rng & Units 11/30/2014 06/01/2014 01/29/2014  Glucose 70 - 140 mg/dl 119 125 149(H)  BUN 7.0 - 26.0 mg/dL 18.9 19.9 22.0  Creatinine 0.6 - 1.1 mg/dL 0.8 1.0 0.8  Sodium 136 - 145 mEq/L 144 142 141  Potassium 3.5 - 5.1 mEq/L 4.0 4.2 4.4  CO2 22 - 29 mEq/L '24 27 28  '$ Calcium 8.4 - 10.4 mg/dL 10.0 10.0 10.2  Total Protein 6.4 - 8.3 g/dL 6.2(L) 6.7 6.6  Total Bilirubin 0.20 - 1.20 mg/dL 0.26 0.39 0.29  Alkaline Phos 40 - 150 U/L 68 80 70  AST 5 - 34 U/L '11 15 12  '$ ALT 0 - 55 U/L '11 17 9    '$ Lab Results  Component Value Date   WBC 5.1  11/30/2014   HGB 11.7 11/30/2014   HCT 35.5 11/30/2014   MCV 84.7 11/30/2014   PLT 207 11/30/2014   NEUTROABS 3.3 11/30/2014    ASSESSMENT & PLAN:  Breast cancer of upper-inner quadrant of right female breast Right breast invasive lobular carcinoma and LCIS diagnosed December 2014 yard 100%, PR percent, HER-2 negative, Ki-67 25% status post right lumpectomy 1.1 cm tumor 2 SLN negative status post radiation therapy and started to Arimidex April 2015.  Arimidex toxicities: No major side effects to Arimidex  Normal bone density 08/28/2013 T score -0.4,  Patient will complete 5 years of therapy 2020  Breast Cancer Surveillance: 1. Breast exam  12/09/2017: Normal 2. Mammogram 08/02/2017, breast density category C  Right knee arthritis: Because of this she is unable to do much physical activity.  Return to clinic in 1 year  No orders of the defined types were placed in this encounter.  The patient has a good understanding of the overall plan. she agrees with it. she will call with any problems that may develop before the next visit here.   Harriette Ohara, MD 12/09/17

## 2017-12-09 NOTE — Telephone Encounter (Signed)
Gave patient AVs and calendar of upcoming may 2020 appointments °

## 2017-12-09 NOTE — Assessment & Plan Note (Signed)
Right breast invasive lobular carcinoma and LCIS diagnosed December 2014 yard 100%, PR percent, HER-2 negative, Ki-67 25% status post right lumpectomy 1.1 cm tumor 2 SLN negative status post radiation therapy and started to Arimidex April 2015.  Arimidex toxicities: No major side effects to Arimidex  Normal bone density 08/28/2013 T score -0.4,  Patient will complete 5 years of therapy 2020  Breast Cancer Surveillance: 1. Breast exam  12/09/2017: Normal 2. Mammogram 08/02/2017, breast density category C  Right knee arthritis: Because of this she is unable to do much physical activity.  Return to clinic in 1 year

## 2018-06-30 ENCOUNTER — Other Ambulatory Visit: Payer: Self-pay | Admitting: Hematology and Oncology

## 2018-06-30 DIAGNOSIS — Z1231 Encounter for screening mammogram for malignant neoplasm of breast: Secondary | ICD-10-CM

## 2018-08-08 ENCOUNTER — Ambulatory Visit
Admission: RE | Admit: 2018-08-08 | Discharge: 2018-08-08 | Disposition: A | Discharge status: Home / Self Care | Payer: Medicare Other | Source: Ambulatory Visit | Attending: Hematology and Oncology | Admitting: Hematology and Oncology

## 2018-08-08 ENCOUNTER — Other Ambulatory Visit: Payer: Self-pay | Admitting: Hematology and Oncology

## 2018-08-08 DIAGNOSIS — Z1231 Encounter for screening mammogram for malignant neoplasm of breast: Secondary | ICD-10-CM

## 2018-08-08 DIAGNOSIS — Z853 Personal history of malignant neoplasm of breast: Secondary | ICD-10-CM

## 2018-08-12 ENCOUNTER — Ambulatory Visit
Admission: RE | Admit: 2018-08-12 | Discharge: 2018-08-12 | Disposition: A | Discharge status: Home / Self Care | Payer: Medicare Other | Source: Ambulatory Visit | Attending: Hematology and Oncology | Admitting: Hematology and Oncology

## 2018-08-12 DIAGNOSIS — Z853 Personal history of malignant neoplasm of breast: Secondary | ICD-10-CM

## 2018-12-04 NOTE — Assessment & Plan Note (Signed)
Right breast invasive lobular carcinoma and LCIS diagnosed December 2014 yard 100%, PR percent, HER-2 negative, Ki-67 25% status post right lumpectomy 1.1 cm tumor 2 SLN negative status post radiation therapy and started to Arimidex April 2015.  Arimidex toxicities: No major side effects to Arimidex  Normal bone density 08/28/2013 T score -0.4,  Patient completed 5 years of therapy and I discontinued antiestrogen therapy today.  Breast Cancer Surveillance: 1. Breast exam 12/09/2017: Normal 2. Mammogram1/02/2019: Benign, breast density category B  Right knee arthritis: Because of this she is unable to do much physical activity.  Return to clinic in 1 year

## 2018-12-05 ENCOUNTER — Telehealth: Payer: Self-pay | Admitting: Hematology and Oncology

## 2018-12-05 NOTE — Telephone Encounter (Signed)
Called regarding upcoming Web[facility] appointment, patient does not have voicemail set up.

## 2018-12-08 NOTE — Progress Notes (Addendum)
I tried calling the patient several times but there was no response. I also tried calling the patient's son without any success. She can be set up for a regular appointment in 3 to 6 months if she does call us back for an appointment.

## 2018-12-09 ENCOUNTER — Inpatient Hospital Stay: Payer: Medicare Other | Attending: Hematology and Oncology | Admitting: Hematology and Oncology

## 2018-12-09 DIAGNOSIS — C50211 Malignant neoplasm of upper-inner quadrant of right female breast: Secondary | ICD-10-CM

## 2018-12-09 DIAGNOSIS — Z170 Estrogen receptor positive status [ER+]: Secondary | ICD-10-CM

## 2018-12-09 NOTE — Progress Notes (Signed)
This encounter was created in error - please disregard.

## 2018-12-20 ENCOUNTER — Other Ambulatory Visit: Payer: Self-pay | Admitting: Hematology and Oncology

## 2018-12-20 DIAGNOSIS — C50211 Malignant neoplasm of upper-inner quadrant of right female breast: Secondary | ICD-10-CM

## 2019-03-21 ENCOUNTER — Other Ambulatory Visit: Payer: Self-pay | Admitting: Hematology and Oncology

## 2019-03-21 DIAGNOSIS — C50211 Malignant neoplasm of upper-inner quadrant of right female breast: Secondary | ICD-10-CM

## 2019-06-07 ENCOUNTER — Other Ambulatory Visit: Payer: Self-pay | Admitting: Hematology and Oncology

## 2019-06-07 DIAGNOSIS — C50211 Malignant neoplasm of upper-inner quadrant of right female breast: Secondary | ICD-10-CM

## 2020-07-13 ENCOUNTER — Other Ambulatory Visit: Payer: Self-pay | Admitting: Hematology and Oncology

## 2020-07-13 ENCOUNTER — Telehealth: Payer: Self-pay | Admitting: Hematology and Oncology

## 2020-07-13 DIAGNOSIS — C50211 Malignant neoplasm of upper-inner quadrant of right female breast: Secondary | ICD-10-CM

## 2020-07-13 NOTE — Telephone Encounter (Signed)
Called pt per 12/8 sch msg - unable to reach pt called pt and son - left message for son's phoen to call back to set up an appt.

## 2020-10-03 DIAGNOSIS — N182 Chronic kidney disease, stage 2 (mild): Secondary | ICD-10-CM | POA: Diagnosis not present

## 2020-10-03 DIAGNOSIS — E7800 Pure hypercholesterolemia, unspecified: Secondary | ICD-10-CM | POA: Diagnosis not present

## 2020-10-03 DIAGNOSIS — K219 Gastro-esophageal reflux disease without esophagitis: Secondary | ICD-10-CM | POA: Diagnosis not present

## 2020-10-03 DIAGNOSIS — E1122 Type 2 diabetes mellitus with diabetic chronic kidney disease: Secondary | ICD-10-CM | POA: Diagnosis not present

## 2020-10-03 DIAGNOSIS — I10 Essential (primary) hypertension: Secondary | ICD-10-CM | POA: Diagnosis not present

## 2020-10-03 DIAGNOSIS — M1612 Unilateral primary osteoarthritis, left hip: Secondary | ICD-10-CM | POA: Diagnosis not present

## 2020-10-03 DIAGNOSIS — C50211 Malignant neoplasm of upper-inner quadrant of right female breast: Secondary | ICD-10-CM | POA: Diagnosis not present

## 2020-10-03 DIAGNOSIS — M179 Osteoarthritis of knee, unspecified: Secondary | ICD-10-CM | POA: Diagnosis not present

## 2020-10-13 ENCOUNTER — Other Ambulatory Visit: Payer: Self-pay | Admitting: Hematology and Oncology

## 2020-10-13 DIAGNOSIS — C50211 Malignant neoplasm of upper-inner quadrant of right female breast: Secondary | ICD-10-CM

## 2020-10-14 DIAGNOSIS — H2513 Age-related nuclear cataract, bilateral: Secondary | ICD-10-CM | POA: Diagnosis not present

## 2020-10-14 DIAGNOSIS — E119 Type 2 diabetes mellitus without complications: Secondary | ICD-10-CM | POA: Diagnosis not present

## 2020-10-14 DIAGNOSIS — H524 Presbyopia: Secondary | ICD-10-CM | POA: Diagnosis not present

## 2020-10-14 DIAGNOSIS — H5203 Hypermetropia, bilateral: Secondary | ICD-10-CM | POA: Diagnosis not present

## 2021-01-03 DIAGNOSIS — E7800 Pure hypercholesterolemia, unspecified: Secondary | ICD-10-CM | POA: Diagnosis not present

## 2021-01-03 DIAGNOSIS — N182 Chronic kidney disease, stage 2 (mild): Secondary | ICD-10-CM | POA: Diagnosis not present

## 2021-01-03 DIAGNOSIS — K219 Gastro-esophageal reflux disease without esophagitis: Secondary | ICD-10-CM | POA: Diagnosis not present

## 2021-01-03 DIAGNOSIS — M1612 Unilateral primary osteoarthritis, left hip: Secondary | ICD-10-CM | POA: Diagnosis not present

## 2021-01-03 DIAGNOSIS — I10 Essential (primary) hypertension: Secondary | ICD-10-CM | POA: Diagnosis not present

## 2021-01-03 DIAGNOSIS — E1122 Type 2 diabetes mellitus with diabetic chronic kidney disease: Secondary | ICD-10-CM | POA: Diagnosis not present

## 2021-02-16 DIAGNOSIS — E7800 Pure hypercholesterolemia, unspecified: Secondary | ICD-10-CM | POA: Diagnosis not present

## 2021-02-16 DIAGNOSIS — Z7984 Long term (current) use of oral hypoglycemic drugs: Secondary | ICD-10-CM | POA: Diagnosis not present

## 2021-02-16 DIAGNOSIS — H9011 Conductive hearing loss, unilateral, right ear, with unrestricted hearing on the contralateral side: Secondary | ICD-10-CM | POA: Diagnosis not present

## 2021-02-16 DIAGNOSIS — H6121 Impacted cerumen, right ear: Secondary | ICD-10-CM | POA: Diagnosis not present

## 2021-02-16 DIAGNOSIS — Z1389 Encounter for screening for other disorder: Secondary | ICD-10-CM | POA: Diagnosis not present

## 2021-02-16 DIAGNOSIS — E113299 Type 2 diabetes mellitus with mild nonproliferative diabetic retinopathy without macular edema, unspecified eye: Secondary | ICD-10-CM | POA: Diagnosis not present

## 2021-02-16 DIAGNOSIS — E113293 Type 2 diabetes mellitus with mild nonproliferative diabetic retinopathy without macular edema, bilateral: Secondary | ICD-10-CM | POA: Diagnosis not present

## 2021-02-16 DIAGNOSIS — E1122 Type 2 diabetes mellitus with diabetic chronic kidney disease: Secondary | ICD-10-CM | POA: Diagnosis not present

## 2021-02-16 DIAGNOSIS — K219 Gastro-esophageal reflux disease without esophagitis: Secondary | ICD-10-CM | POA: Diagnosis not present

## 2021-02-16 DIAGNOSIS — N182 Chronic kidney disease, stage 2 (mild): Secondary | ICD-10-CM | POA: Diagnosis not present

## 2021-02-16 DIAGNOSIS — Z0001 Encounter for general adult medical examination with abnormal findings: Secondary | ICD-10-CM | POA: Diagnosis not present

## 2021-02-16 DIAGNOSIS — I10 Essential (primary) hypertension: Secondary | ICD-10-CM | POA: Diagnosis not present

## 2021-04-03 DIAGNOSIS — N182 Chronic kidney disease, stage 2 (mild): Secondary | ICD-10-CM | POA: Diagnosis not present

## 2021-04-03 DIAGNOSIS — I10 Essential (primary) hypertension: Secondary | ICD-10-CM | POA: Diagnosis not present

## 2021-04-03 DIAGNOSIS — E7800 Pure hypercholesterolemia, unspecified: Secondary | ICD-10-CM | POA: Diagnosis not present

## 2021-04-03 DIAGNOSIS — M179 Osteoarthritis of knee, unspecified: Secondary | ICD-10-CM | POA: Diagnosis not present

## 2021-04-03 DIAGNOSIS — C50211 Malignant neoplasm of upper-inner quadrant of right female breast: Secondary | ICD-10-CM | POA: Diagnosis not present

## 2021-04-03 DIAGNOSIS — K219 Gastro-esophageal reflux disease without esophagitis: Secondary | ICD-10-CM | POA: Diagnosis not present

## 2021-04-03 DIAGNOSIS — E1122 Type 2 diabetes mellitus with diabetic chronic kidney disease: Secondary | ICD-10-CM | POA: Diagnosis not present

## 2021-04-03 DIAGNOSIS — M1612 Unilateral primary osteoarthritis, left hip: Secondary | ICD-10-CM | POA: Diagnosis not present

## 2021-06-23 DIAGNOSIS — K219 Gastro-esophageal reflux disease without esophagitis: Secondary | ICD-10-CM | POA: Diagnosis not present

## 2021-06-23 DIAGNOSIS — I10 Essential (primary) hypertension: Secondary | ICD-10-CM | POA: Diagnosis not present

## 2021-06-23 DIAGNOSIS — E1122 Type 2 diabetes mellitus with diabetic chronic kidney disease: Secondary | ICD-10-CM | POA: Diagnosis not present

## 2021-06-23 DIAGNOSIS — J302 Other seasonal allergic rhinitis: Secondary | ICD-10-CM | POA: Diagnosis not present

## 2021-06-23 DIAGNOSIS — N182 Chronic kidney disease, stage 2 (mild): Secondary | ICD-10-CM | POA: Diagnosis not present

## 2021-09-05 DIAGNOSIS — C50211 Malignant neoplasm of upper-inner quadrant of right female breast: Secondary | ICD-10-CM | POA: Diagnosis not present

## 2021-09-05 DIAGNOSIS — N182 Chronic kidney disease, stage 2 (mild): Secondary | ICD-10-CM | POA: Diagnosis not present

## 2021-09-05 DIAGNOSIS — E7800 Pure hypercholesterolemia, unspecified: Secondary | ICD-10-CM | POA: Diagnosis not present

## 2021-09-05 DIAGNOSIS — E1122 Type 2 diabetes mellitus with diabetic chronic kidney disease: Secondary | ICD-10-CM | POA: Diagnosis not present

## 2021-09-05 DIAGNOSIS — M1612 Unilateral primary osteoarthritis, left hip: Secondary | ICD-10-CM | POA: Diagnosis not present

## 2021-09-05 DIAGNOSIS — K219 Gastro-esophageal reflux disease without esophagitis: Secondary | ICD-10-CM | POA: Diagnosis not present

## 2021-09-05 DIAGNOSIS — I10 Essential (primary) hypertension: Secondary | ICD-10-CM | POA: Diagnosis not present

## 2021-09-05 DIAGNOSIS — M179 Osteoarthritis of knee, unspecified: Secondary | ICD-10-CM | POA: Diagnosis not present

## 2021-10-17 DIAGNOSIS — M25552 Pain in left hip: Secondary | ICD-10-CM | POA: Diagnosis not present

## 2021-10-17 DIAGNOSIS — I10 Essential (primary) hypertension: Secondary | ICD-10-CM | POA: Diagnosis not present

## 2021-10-17 DIAGNOSIS — R600 Localized edema: Secondary | ICD-10-CM | POA: Diagnosis not present

## 2021-10-17 DIAGNOSIS — E113293 Type 2 diabetes mellitus with mild nonproliferative diabetic retinopathy without macular edema, bilateral: Secondary | ICD-10-CM | POA: Diagnosis not present

## 2021-10-19 DIAGNOSIS — M1712 Unilateral primary osteoarthritis, left knee: Secondary | ICD-10-CM | POA: Diagnosis not present

## 2021-10-19 DIAGNOSIS — M25552 Pain in left hip: Secondary | ICD-10-CM | POA: Diagnosis not present

## 2021-10-19 DIAGNOSIS — M1711 Unilateral primary osteoarthritis, right knee: Secondary | ICD-10-CM | POA: Diagnosis not present

## 2021-10-25 DIAGNOSIS — R609 Edema, unspecified: Secondary | ICD-10-CM | POA: Diagnosis not present

## 2021-10-25 DIAGNOSIS — R030 Elevated blood-pressure reading, without diagnosis of hypertension: Secondary | ICD-10-CM | POA: Diagnosis not present

## 2021-11-03 DIAGNOSIS — R601 Generalized edema: Secondary | ICD-10-CM | POA: Diagnosis not present

## 2021-11-03 DIAGNOSIS — R600 Localized edema: Secondary | ICD-10-CM | POA: Diagnosis not present

## 2021-11-03 DIAGNOSIS — R0609 Other forms of dyspnea: Secondary | ICD-10-CM | POA: Diagnosis not present

## 2021-11-03 DIAGNOSIS — R609 Edema, unspecified: Secondary | ICD-10-CM | POA: Diagnosis not present

## 2021-11-13 ENCOUNTER — Other Ambulatory Visit: Payer: Self-pay | Admitting: Hematology and Oncology

## 2021-11-13 DIAGNOSIS — C50211 Malignant neoplasm of upper-inner quadrant of right female breast: Secondary | ICD-10-CM

## 2021-12-21 DIAGNOSIS — M25561 Pain in right knee: Secondary | ICD-10-CM | POA: Diagnosis not present

## 2021-12-21 DIAGNOSIS — M1711 Unilateral primary osteoarthritis, right knee: Secondary | ICD-10-CM | POA: Diagnosis not present

## 2022-02-22 DIAGNOSIS — K219 Gastro-esophageal reflux disease without esophagitis: Secondary | ICD-10-CM | POA: Diagnosis not present

## 2022-02-22 DIAGNOSIS — M179 Osteoarthritis of knee, unspecified: Secondary | ICD-10-CM | POA: Diagnosis not present

## 2022-02-22 DIAGNOSIS — E7800 Pure hypercholesterolemia, unspecified: Secondary | ICD-10-CM | POA: Diagnosis not present

## 2022-02-22 DIAGNOSIS — E1122 Type 2 diabetes mellitus with diabetic chronic kidney disease: Secondary | ICD-10-CM | POA: Diagnosis not present

## 2022-02-22 DIAGNOSIS — N182 Chronic kidney disease, stage 2 (mild): Secondary | ICD-10-CM | POA: Diagnosis not present

## 2022-02-22 DIAGNOSIS — Z0000 Encounter for general adult medical examination without abnormal findings: Secondary | ICD-10-CM | POA: Diagnosis not present

## 2022-02-22 DIAGNOSIS — Z1389 Encounter for screening for other disorder: Secondary | ICD-10-CM | POA: Diagnosis not present

## 2022-02-22 DIAGNOSIS — R609 Edema, unspecified: Secondary | ICD-10-CM | POA: Diagnosis not present

## 2022-02-22 DIAGNOSIS — I10 Essential (primary) hypertension: Secondary | ICD-10-CM | POA: Diagnosis not present

## 2023-01-06 ENCOUNTER — Emergency Department (HOSPITAL_COMMUNITY)
Admission: EM | Admit: 2023-01-06 | Discharge: 2023-01-06 | Disposition: A | Discharge status: Home / Self Care | Payer: Medicare Other | Attending: Emergency Medicine | Admitting: Emergency Medicine

## 2023-01-06 ENCOUNTER — Encounter (HOSPITAL_COMMUNITY): Payer: Self-pay

## 2023-01-06 ENCOUNTER — Emergency Department (HOSPITAL_COMMUNITY): Payer: Medicare Other

## 2023-01-06 ENCOUNTER — Other Ambulatory Visit: Payer: Self-pay

## 2023-01-06 DIAGNOSIS — M1711 Unilateral primary osteoarthritis, right knee: Secondary | ICD-10-CM | POA: Diagnosis not present

## 2023-01-06 DIAGNOSIS — G8929 Other chronic pain: Secondary | ICD-10-CM | POA: Diagnosis not present

## 2023-01-06 DIAGNOSIS — Z79899 Other long term (current) drug therapy: Secondary | ICD-10-CM | POA: Insufficient documentation

## 2023-01-06 DIAGNOSIS — M25561 Pain in right knee: Secondary | ICD-10-CM | POA: Insufficient documentation

## 2023-01-06 DIAGNOSIS — W01198A Fall on same level from slipping, tripping and stumbling with subsequent striking against other object, initial encounter: Secondary | ICD-10-CM | POA: Diagnosis not present

## 2023-01-06 DIAGNOSIS — M25521 Pain in right elbow: Secondary | ICD-10-CM | POA: Insufficient documentation

## 2023-01-06 DIAGNOSIS — Z7984 Long term (current) use of oral hypoglycemic drugs: Secondary | ICD-10-CM | POA: Diagnosis not present

## 2023-01-06 DIAGNOSIS — Z7982 Long term (current) use of aspirin: Secondary | ICD-10-CM | POA: Diagnosis not present

## 2023-01-06 DIAGNOSIS — M7989 Other specified soft tissue disorders: Secondary | ICD-10-CM | POA: Diagnosis not present

## 2023-01-06 DIAGNOSIS — M25551 Pain in right hip: Secondary | ICD-10-CM | POA: Diagnosis not present

## 2023-01-06 DIAGNOSIS — S0990XA Unspecified injury of head, initial encounter: Secondary | ICD-10-CM | POA: Diagnosis not present

## 2023-01-06 DIAGNOSIS — W19XXXA Unspecified fall, initial encounter: Secondary | ICD-10-CM

## 2023-01-06 DIAGNOSIS — Z043 Encounter for examination and observation following other accident: Secondary | ICD-10-CM | POA: Diagnosis not present

## 2023-01-06 MED ORDER — FENTANYL CITRATE PF 50 MCG/ML IJ SOSY: 50.0000 ug | PREFILLED_SYRINGE | Freq: Once | INTRAMUSCULAR | Status: DC

## 2023-01-06 NOTE — ED Provider Notes (Signed)
[place] EMERGENCY DEPARTMENT AT [facility] Provider Note   CSN: [id number] Arrival date & time: 01/06/23  1540     History  No chief complaint on file.   Laurie Fox is a 89 y.o. female.  Patient presents to the emergency department complaining of pain secondary to a fall.  Patient states she was walking up the stairs to her townhome when her dog pulled her over.  She states she fell backwards injuring her right knee, hip, elbow, and hitting the back of her head on the ground.  She denies losing consciousness.  She denies blood thinner usage.  She states that her right elbow was bleeding and she bandaged it at home. She has end stage osteoarthritis of the right knee and states she has been told she needs replacement but has never had 1.  She states she has sciatic pain that runs in the left hip but this is chronic in nature.  Her chief complaint at this time is the right hip pain.  Past medical history significant for type II DM, GERD, hypertension, previous right knee arthroscopy  HPI     Home Medications Prior to Admission medications   Medication Sig Start Date End Date Taking? Authorizing Provider  acetaminophen (TYLENOL) 325 MG tablet Take 650 mg by mouth every 6 (six) hours as needed.    [provider]  amLODipine (NORVASC) 10 MG tablet Take 10 mg by mouth daily.    [provider]  anastrozole (ARIMIDEX) 1 MG tablet TAKE 1 TABLET BY MOUTH  DAILY 10/13/20   Serena Croissant, MD  aspirin 81 MG tablet Take 81 mg by mouth daily.    [provider]  bismuth subsalicylate (PEPTO BISMOL) 262 MG chewable tablet Chew 524 mg by mouth as needed.    [provider]  carboxymethylcellulose (REFRESH PLUS) 0.5 % SOLN 1 drop 3 (three) times daily as needed.    [provider]  carvedilol (COREG) 25 MG tablet Take 25 mg by mouth 2 (two) times daily with a meal.    [provider]  cycloSPORINE (RESTASIS) 0.05 % ophthalmic  emulsion 1 drop 2 (two) times daily.    [provider]  lidocaine (XYLOCAINE) 5 % ointment Apply 1 application topically as needed. 12/09/17   Serena Croissant, MD  Liniments (BLUE-EMU SUPER STRENGTH) CREA Apply 1 application topically as needed. 12/09/17   Serena Croissant, MD  MELATONIN PO Take 2.5 mg by mouth. 1/2 tablet once a day    [provider]  metFORMIN (GLUCOPHAGE) 500 MG tablet Take 500 mg by mouth 2 (two) times daily with a meal. 3 tablets in am, 2 tablets in evening    [provider]  Multiple Vitamin (MULTI-VITAMIN DAILY PO) Take by mouth.    [provider]  omeprazole (PRILOSEC) 20 MG capsule Take 20 mg by mouth daily.    [provider]  simvastatin (ZOCOR) 20 MG tablet Take 20 mg by mouth daily at 6 PM.     [provider]  telmisartan-hydrochlorothiazide (MICARDIS HCT) 40-12.5 MG per tablet Take 1 tablet by mouth daily.    [provider]  triamcinolone (NASACORT) 55 MCG/ACT AERO nasal inhaler Place 2 sprays into the nose daily. 12/09/17   Serena Croissant, MD  vitamin B-12 (CYANOCOBALAMIN) 100 MCG tablet Take 100 mcg by mouth daily.    [provider]  VITAMIN D, ERGOCALCIFEROL, PO Take by mouth.    [provider]      Allergies  Codeine and Sulfa antibiotics    Review of Systems   Review of Systems  Physical Exam Updated Vital Signs BP (!) 174/61 (BP Location: Right Arm)   Pulse 74   Temp 99.2 F (37.3 C) (Oral)   Resp (!) 26   SpO2 93%  Physical Exam Vitals and nursing note reviewed.  Constitutional:      General: She is not in acute distress.    Appearance: She is well-developed.  HENT:     Head: Normocephalic and atraumatic.  Eyes:     Conjunctiva/sclera: Conjunctivae normal.  Cardiovascular:     Rate and Rhythm: Normal rate and regular rhythm.     Heart sounds: No murmur heard. Pulmonary:     Effort: Pulmonary effort is normal. No respiratory distress.     Breath sounds: Normal  breath sounds.  Abdominal:     Palpations: Abdomen is soft.     Tenderness: There is no abdominal tenderness.  Musculoskeletal:        General: Tenderness and signs of injury present. No swelling or deformity.     Cervical back: Neck supple.     Comments: Tenderness to palpation of right elbow, lateral right hip, medial right knee.  Patient able to ambulate with walker without difficulty.  Patient with pain with passive range of motion of the right hip and right knee (patient states chronic in nature).  Sensation intact  Skin:    General: Skin is warm and dry.     Capillary Refill: Capillary refill takes less than 2 seconds.  Neurological:     Mental Status: She is alert.  Psychiatric:        Mood and Affect: Mood normal.     ED Results / Procedures / Treatments   Labs (all labs ordered are listed, but only abnormal results are displayed) Labs Reviewed - No data to display  EKG None  Radiology CT Head Wo Contrast  Result Date: 01/06/2023 CLINICAL DATA:  Head trauma, minor EXAM: CT HEAD WITHOUT CONTRAST TECHNIQUE: Contiguous axial images were obtained from the base of the skull through the vertex without intravenous contrast. RADIATION DOSE REDUCTION: This exam was performed according to the departmental dose-optimization program which includes automated exposure control, adjustment of the mA and/or kV according to patient size and/or use of iterative reconstruction technique. COMPARISON:  None Available. FINDINGS: Brain: Diffuse cerebral atrophy. Ventricular dilatation consistent with central atrophy. Low-attenuation changes in the deep white matter consistent with small vessel ischemia. No abnormal extra-axial fluid collections. No mass effect or midline shift. Gray-white matter junctions are distinct. Basal cisterns are not effaced. No acute intracranial hemorrhage. Vascular: No hyperdense vessel or unexpected calcification. Skull: Normal. Negative for fracture or focal lesion.  Sinuses/Orbits: Mucosal thickening in the paranasal sinuses. No acute air-fluid levels. Mastoid air cells are clear. Other: None. IMPRESSION: No acute intracranial abnormalities. Chronic atrophy and small vessel ischemic changes. Electronically Signed   By: Burman Nieves M.D.   On: 01/06/2023 17:27   DG Elbow Complete Right  Result Date: 01/06/2023 CLINICAL DATA:  Larey Seat on stairs. EXAM: RIGHT ELBOW - COMPLETE 3+ VIEW COMPARISON:  None Available. FINDINGS: Degenerative changes in the right elbow. Old appearing ununited ossicle at the radiocapitellar joint likely representing a loose body. Tiny osseous fragment adjacent to the olecranon process suggesting avulsion fracture. There is overlying soft tissue swelling. No other fractures or dislocation identified. No significant effusion. IMPRESSION: 1. Tiny osseous fragment over the olecranon process with overlying soft tissue swelling, likely ligamentous avulsion. 2.  Degenerative changes in the right elbow. Old appearing ununited ossicle in the radiocapitellar joint may represent a loose body. Electronically Signed   By: Burman Nieves M.D.   On: 01/06/2023 17:22   DG Knee Complete 4 Views Right  Result Date: 01/06/2023 CLINICAL DATA:  Fall EXAM: RIGHT KNEE - COMPLETE 4+ VIEW COMPARISON:  None Available. FINDINGS: No evidence of fracture, dislocation, or joint effusion. Tricompartmental osteoarthritis, severe within the medial and patellofemoral compartments. Soft tissue swelling about the knee appears most pronounced laterally. IMPRESSION: 1. No acute fracture or dislocation. 2. Tricompartmental osteoarthritis, severe within the medial and patellofemoral compartments. Electronically Signed   By: Duanne Guess D.O.   On: 01/06/2023 17:22   DG HIPS BILAT WITH PELVIS MIN 5 VIEWS  Result Date: 01/06/2023 CLINICAL DATA:  Fall.  Pain EXAM: DG HIP (WITH OR WITHOUT PELVIS) 5+V BILAT COMPARISON:  None Available. FINDINGS: Bones are mildly demineralized. There is  no evidence of hip fracture or dislocation. Bony pelvis intact without evidence of fracture or diastasis. There is no evidence of arthropathy or other focal bone abnormality. Atherosclerotic vascular calcifications are present. IMPRESSION: Negative. Electronically Signed   By: Duanne Guess D.O.   On: 01/06/2023 17:21    Procedures Procedures    Medications Ordered in ED Medications  fentaNYL (SUBLIMAZE) injection 50 mcg (has no administration in time range)    ED Course/ Medical Decision Making/ A&P                             Medical Decision Making Amount and/or Complexity of Data Reviewed Radiology: ordered.   This patient presents to the ED for concern of injuries post fall, this involves an extensive number of treatment options, and is a complaint that carries with it a high risk of complications and morbidity.  The differential diagnosis includes intracranial abnormality, fracture, dislocation, soft tissue injury, others   Co morbidities that complicate the patient evaluation  History of severe osteoarthritis in the right knee   Additional history obtained:  Additional history obtained from family at bedside   Imaging Studies ordered:  I ordered imaging studies including plain films of the hips, right knee, right elbow, and CT of the head without contrast I independently visualized and interpreted imaging which showed no fracture or dislocation in the right knee or bilateral hips. Elbow x-ray shows 1. Tiny osseous fragment over the olecranon process with overlying  soft tissue swelling, likely ligamentous avulsion.  2. Degenerative changes in the right elbow. Old appearing ununited  ossicle in the radiocapitellar joint may represent a loose body.  CT head shows no acute findings.  Chronic small vessel ischemic changes noted I agree with the radiologist interpretation   Problem List / ED Course / Critical interventions / Medication management   I ordered  medication including fentanyl for pain.  The patient began to feel better without administration of the medicine. I have reviewed the patients home medicines and have made adjustments as needed   Social Determinants of Health:  Patient lives at home alone   Test / Admission - Considered:  The patient has no acute injuries noted on imaging other than a possible ligamentous avulsion in the right elbow.  The history suggest the fall was purely mechanical with the dog pulling her backwards.  Plan to have patient follow-up with orthopedics for further evaluation and management.  She was able to ambulate without difficulty with a walker which is how she  ambulates at home.  She states she takes Tylenol for pain and feels comfortable continuing to take the same at home.  No acute intracranial findings noted.  There is a skin tear on the right elbow which has been bandaged and cleaned.  The patient has been instructed on keeping the wound clean.  Return precautions provided.  Discharged home.         Final Clinical Impression(s) / ED Diagnoses Final diagnoses:  Fall, initial encounter  Right hip pain  Chronic pain of right knee  Right elbow pain    Rx / DC Orders ED Discharge Orders     None         Pamala Duffel 01/06/23 Mallie Snooks    Eber Hong, MD 01/07/23 1250

## 2023-01-06 NOTE — Discharge Instructions (Signed)
You were evaluated today for injury secondary to a fall.  Your x-rays were reassuring with no fractures or dislocations noted in the hips or right knee.  Your CT of your head was normal.  There may have been a small ligamentous injury noted in the right elbow.  Please follow-up with orthopedics for further evaluation and management.  You may take Tylenol at home for pain.  Take no more than 1000 mg every 6 hours.  I also recommend icing the affected extremities.

## 2023-01-06 NOTE — ED Triage Notes (Addendum)
Pt came in via POV d/t falling this afternoon d/t a dog knocking her over & she fell back & hit her head, does NOT take thinners. A/Ox4, denies LOC. Does have a skin tear to her Rt elbow, covered with gauze & not bleeding while in triage. Rates pain 8/10 in her Rt elbow, Rt knee, Rt ribs & Lt hip.

## 2023-01-10 ENCOUNTER — Ambulatory Visit: Payer: Medicare Other | Admitting: Orthopaedic Surgery

## 2023-01-10 ENCOUNTER — Ambulatory Visit: Payer: Medicare Other | Admitting: Physician Assistant

## 2023-01-10 ENCOUNTER — Emergency Department (HOSPITAL_COMMUNITY): Payer: Medicare Other

## 2023-01-10 ENCOUNTER — Inpatient Hospital Stay (HOSPITAL_COMMUNITY): Payer: Medicare Other | Admitting: Anesthesiology

## 2023-01-10 ENCOUNTER — Other Ambulatory Visit: Payer: Self-pay

## 2023-01-10 ENCOUNTER — Inpatient Hospital Stay (HOSPITAL_COMMUNITY)
Admission: EM | Admit: 2023-01-10 | Discharge: 2023-01-15 | DRG: 522 | Disposition: A | Discharge status: Skilled Nursing Facility | Payer: Medicare Other | Attending: Family Medicine | Admitting: Family Medicine

## 2023-01-10 ENCOUNTER — Encounter (HOSPITAL_COMMUNITY): Payer: Self-pay

## 2023-01-10 DIAGNOSIS — M25551 Pain in right hip: Secondary | ICD-10-CM

## 2023-01-10 DIAGNOSIS — E440 Moderate protein-calorie malnutrition: Secondary | ICD-10-CM | POA: Diagnosis not present

## 2023-01-10 DIAGNOSIS — Z79899 Other long term (current) drug therapy: Secondary | ICD-10-CM

## 2023-01-10 DIAGNOSIS — M25521 Pain in right elbow: Secondary | ICD-10-CM

## 2023-01-10 DIAGNOSIS — Z853 Personal history of malignant neoplasm of breast: Secondary | ICD-10-CM

## 2023-01-10 DIAGNOSIS — E785 Hyperlipidemia, unspecified: Secondary | ICD-10-CM | POA: Diagnosis present

## 2023-01-10 DIAGNOSIS — I080 Rheumatic disorders of both mitral and aortic valves: Secondary | ICD-10-CM | POA: Diagnosis not present

## 2023-01-10 DIAGNOSIS — Z820 Family history of epilepsy and other diseases of the nervous system: Secondary | ICD-10-CM | POA: Diagnosis not present

## 2023-01-10 DIAGNOSIS — Z6826 Body mass index (BMI) 26.0-26.9, adult: Secondary | ICD-10-CM | POA: Diagnosis not present

## 2023-01-10 DIAGNOSIS — M1990 Unspecified osteoarthritis, unspecified site: Secondary | ICD-10-CM | POA: Diagnosis not present

## 2023-01-10 DIAGNOSIS — Z885 Allergy status to narcotic agent status: Secondary | ICD-10-CM

## 2023-01-10 DIAGNOSIS — S72091D Other fracture of head and neck of right femur, subsequent encounter for closed fracture with routine healing: Secondary | ICD-10-CM | POA: Diagnosis not present

## 2023-01-10 DIAGNOSIS — Z7982 Long term (current) use of aspirin: Secondary | ICD-10-CM

## 2023-01-10 DIAGNOSIS — Z79811 Long term (current) use of aromatase inhibitors: Secondary | ICD-10-CM

## 2023-01-10 DIAGNOSIS — R0781 Pleurodynia: Secondary | ICD-10-CM | POA: Diagnosis not present

## 2023-01-10 DIAGNOSIS — R531 Weakness: Secondary | ICD-10-CM | POA: Diagnosis not present

## 2023-01-10 DIAGNOSIS — Z800 Family history of malignant neoplasm of digestive organs: Secondary | ICD-10-CM

## 2023-01-10 DIAGNOSIS — Y93K1 Activity, walking an animal: Secondary | ICD-10-CM

## 2023-01-10 DIAGNOSIS — I10 Essential (primary) hypertension: Secondary | ICD-10-CM | POA: Diagnosis present

## 2023-01-10 DIAGNOSIS — E119 Type 2 diabetes mellitus without complications: Secondary | ICD-10-CM | POA: Diagnosis not present

## 2023-01-10 DIAGNOSIS — Z7984 Long term (current) use of oral hypoglycemic drugs: Secondary | ICD-10-CM

## 2023-01-10 DIAGNOSIS — S72041A Displaced fracture of base of neck of right femur, initial encounter for closed fracture: Secondary | ICD-10-CM | POA: Diagnosis not present

## 2023-01-10 DIAGNOSIS — Z923 Personal history of irradiation: Secondary | ICD-10-CM

## 2023-01-10 DIAGNOSIS — S72011A Unspecified intracapsular fracture of right femur, initial encounter for closed fracture: Principal | ICD-10-CM | POA: Diagnosis present

## 2023-01-10 DIAGNOSIS — S72001A Fracture of unspecified part of neck of right femur, initial encounter for closed fracture: Secondary | ICD-10-CM | POA: Insufficient documentation

## 2023-01-10 DIAGNOSIS — Z882 Allergy status to sulfonamides status: Secondary | ICD-10-CM

## 2023-01-10 DIAGNOSIS — N289 Disorder of kidney and ureter, unspecified: Secondary | ICD-10-CM | POA: Diagnosis not present

## 2023-01-10 DIAGNOSIS — W1830XA Fall on same level, unspecified, initial encounter: Secondary | ICD-10-CM | POA: Diagnosis present

## 2023-01-10 DIAGNOSIS — R2681 Unsteadiness on feet: Secondary | ICD-10-CM | POA: Diagnosis not present

## 2023-01-10 DIAGNOSIS — C50211 Malignant neoplasm of upper-inner quadrant of right female breast: Secondary | ICD-10-CM | POA: Diagnosis present

## 2023-01-10 DIAGNOSIS — I361 Nonrheumatic tricuspid (valve) insufficiency: Secondary | ICD-10-CM | POA: Diagnosis not present

## 2023-01-10 DIAGNOSIS — Z9181 History of falling: Secondary | ICD-10-CM | POA: Diagnosis not present

## 2023-01-10 DIAGNOSIS — S72009A Fracture of unspecified part of neck of unspecified femur, initial encounter for closed fracture: Secondary | ICD-10-CM | POA: Diagnosis present

## 2023-01-10 DIAGNOSIS — D62 Acute posthemorrhagic anemia: Secondary | ICD-10-CM | POA: Diagnosis not present

## 2023-01-10 DIAGNOSIS — R2689 Other abnormalities of gait and mobility: Secondary | ICD-10-CM | POA: Diagnosis not present

## 2023-01-10 DIAGNOSIS — K219 Gastro-esophageal reflux disease without esophagitis: Secondary | ICD-10-CM | POA: Diagnosis present

## 2023-01-10 DIAGNOSIS — I340 Nonrheumatic mitral (valve) insufficiency: Secondary | ICD-10-CM | POA: Diagnosis not present

## 2023-01-10 DIAGNOSIS — E1122 Type 2 diabetes mellitus with diabetic chronic kidney disease: Secondary | ICD-10-CM | POA: Diagnosis not present

## 2023-01-10 DIAGNOSIS — Z7401 Bed confinement status: Secondary | ICD-10-CM | POA: Diagnosis not present

## 2023-01-10 DIAGNOSIS — M6281 Muscle weakness (generalized): Secondary | ICD-10-CM | POA: Diagnosis not present

## 2023-01-10 DIAGNOSIS — Z471 Aftercare following joint replacement surgery: Secondary | ICD-10-CM | POA: Diagnosis not present

## 2023-01-10 DIAGNOSIS — Z96641 Presence of right artificial hip joint: Secondary | ICD-10-CM | POA: Diagnosis not present

## 2023-01-10 DIAGNOSIS — Z743 Need for continuous supervision: Secondary | ICD-10-CM | POA: Diagnosis not present

## 2023-01-10 DIAGNOSIS — N179 Acute kidney failure, unspecified: Secondary | ICD-10-CM | POA: Diagnosis not present

## 2023-01-10 DIAGNOSIS — M7989 Other specified soft tissue disorders: Secondary | ICD-10-CM | POA: Diagnosis not present

## 2023-01-10 DIAGNOSIS — G8911 Acute pain due to trauma: Secondary | ICD-10-CM | POA: Diagnosis not present

## 2023-01-10 LAB — CBC WITH DIFFERENTIAL/PLATELET
Abs Immature Granulocytes: 0.07 10*3/uL (ref 0.00–0.07)
Basophils Absolute: 0 10*3/uL (ref 0.0–0.1)
Basophils Relative: 0 %
Eosinophils Absolute: 0.1 10*3/uL (ref 0.0–0.5)
Eosinophils Relative: 1 %
HCT: 36.1 % (ref 36.0–46.0)
Hemoglobin: 11.4 g/dL — ABNORMAL LOW (ref 12.0–15.0)
Immature Granulocytes: 1 %
Lymphocytes Relative: 6 %
Lymphs Abs: 0.8 10*3/uL (ref 0.7–4.0)
MCH: 26.9 pg (ref 26.0–34.0)
MCHC: 31.6 g/dL (ref 30.0–36.0)
MCV: 85.1 fL (ref 80.0–100.0)
Monocytes Absolute: 0.8 10*3/uL (ref 0.1–1.0)
Monocytes Relative: 6 %
Neutro Abs: 10.9 10*3/uL — ABNORMAL HIGH (ref 1.7–7.7)
Neutrophils Relative %: 86 %
Platelets: 299 10*3/uL (ref 150–400)
RBC: 4.24 MIL/uL (ref 3.87–5.11)
RDW: 20.2 % — ABNORMAL HIGH (ref 11.5–15.5)
WBC: 12.6 10*3/uL — ABNORMAL HIGH (ref 4.0–10.5)
nRBC: 0 % (ref 0.0–0.2)

## 2023-01-10 LAB — HEMOGLOBIN A1C
Hgb A1c MFr Bld: 6.7 % — ABNORMAL HIGH (ref 4.8–5.6)
Mean Plasma Glucose: 145.59 mg/dL

## 2023-01-10 LAB — BASIC METABOLIC PANEL
Anion gap: 12 (ref 5–15)
BUN: 31 mg/dL — ABNORMAL HIGH (ref 8–23)
CO2: 26 mmol/L (ref 22–32)
Calcium: 9.8 mg/dL (ref 8.9–10.3)
Chloride: 99 mmol/L (ref 98–111)
Creatinine, Ser: 1.4 mg/dL — ABNORMAL HIGH (ref 0.44–1.00)
GFR, Estimated: 36 mL/min — ABNORMAL LOW (ref >60)
Glucose, Bld: 216 mg/dL — ABNORMAL HIGH (ref 70–99)
Potassium: 4 mmol/L (ref 3.5–5.1)
Sodium: 137 mmol/L (ref 135–145)

## 2023-01-10 LAB — CBG MONITORING, ED
Glucose-Capillary: 188 mg/dL — ABNORMAL HIGH (ref 70–99)
Glucose-Capillary: 174 mg/dL — ABNORMAL HIGH (ref 70–99)

## 2023-01-10 LAB — GLUCOSE, CAPILLARY: Glucose-Capillary: 165 mg/dL — ABNORMAL HIGH (ref 70–99)

## 2023-01-10 MED ORDER — TELMISARTAN-HCTZ 40-12.5 MG PO TABS: 1.0000 | ORAL_TABLET | Freq: Every day | ORAL | Status: DC

## 2023-01-10 MED ORDER — POLYVINYL ALCOHOL 1.4 % OP SOLN: 1.0000 [drp] | Freq: Three times a day (TID) | OPHTHALMIC | Status: DC | PRN

## 2023-01-10 MED ORDER — INSULIN ASPART 100 UNIT/ML IJ SOLN
0.0000 [IU] | Freq: Three times a day (TID) | INTRAMUSCULAR | Status: DC
  Administered 2023-01-10 – 2023-01-11 (×2): 2 [IU] via SUBCUTANEOUS
  Administered 2023-01-11: 1 [IU] via SUBCUTANEOUS
  Administered 2023-01-11 – 2023-01-12 (×2): 3 [IU] via SUBCUTANEOUS
  Administered 2023-01-13 (×2): 2 [IU] via SUBCUTANEOUS
  Administered 2023-01-13 – 2023-01-14 (×2): 1 [IU] via SUBCUTANEOUS
  Administered 2023-01-14: 3 [IU] via SUBCUTANEOUS
  Administered 2023-01-14 – 2023-01-15 (×2): 2 [IU] via SUBCUTANEOUS
  Administered 2023-01-15: 3 [IU] via SUBCUTANEOUS
  Administered 2023-01-15: 2 [IU] via SUBCUTANEOUS

## 2023-01-10 MED ORDER — HYDRALAZINE HCL 20 MG/ML IJ SOLN: 5.0000 mg | Freq: Four times a day (QID) | INTRAMUSCULAR | Status: DC | PRN

## 2023-01-10 MED ORDER — HYDROCODONE-ACETAMINOPHEN 5-325 MG PO TABS
1.0000 | ORAL_TABLET | Freq: Four times a day (QID) | ORAL | Status: DC | PRN
  Administered 2023-01-12: 1 via ORAL
  Administered 2023-01-13: 2 via ORAL
  Administered 2023-01-13 (×2): 1 via ORAL
  Administered 2023-01-14 (×2): 2 via ORAL
  Administered 2023-01-14: 1 via ORAL
  Administered 2023-01-15: 2 via ORAL
  Filled 2023-01-10 (×2): qty 1
  Filled 2023-01-10 (×2): qty 2
  Filled 2023-01-10 (×2): qty 1
  Filled 2023-01-10 (×2): qty 2

## 2023-01-10 MED ORDER — SENNOSIDES-DOCUSATE SODIUM 8.6-50 MG PO TABS: 1.0000 | ORAL_TABLET | Freq: Every evening | ORAL | Status: DC | PRN

## 2023-01-10 MED ORDER — SIMVASTATIN 20 MG PO TABS
20.0000 mg | ORAL_TABLET | Freq: Every day | ORAL | Status: DC
  Administered 2023-01-11 – 2023-01-14 (×4): 20 mg via ORAL
  Filled 2023-01-10 (×4): qty 1

## 2023-01-10 MED ORDER — IRBESARTAN 150 MG PO TABS
150.0000 mg | ORAL_TABLET | Freq: Every day | ORAL | Status: DC
  Administered 2023-01-11 – 2023-01-15 (×4): 150 mg via ORAL
  Filled 2023-01-10 (×5): qty 1

## 2023-01-10 MED ORDER — AMLODIPINE BESYLATE 5 MG PO TABS: 10.0000 mg | ORAL_TABLET | Freq: Every day | ORAL | Status: DC

## 2023-01-10 MED ORDER — ASPIRIN 81 MG PO CHEW
81.0000 mg | CHEWABLE_TABLET | Freq: Every day | ORAL | Status: DC
  Administered 2023-01-10 – 2023-01-11 (×2): 81 mg via ORAL
  Filled 2023-01-10 (×2): qty 1

## 2023-01-10 MED ORDER — CARVEDILOL 12.5 MG PO TABS
25.0000 mg | ORAL_TABLET | Freq: Once | ORAL | Status: AC
  Administered 2023-01-10: 25 mg via ORAL
  Filled 2023-01-10: qty 2

## 2023-01-10 MED ORDER — HEPARIN SODIUM (PORCINE) 5000 UNIT/ML IJ SOLN
5000.0000 [IU] | Freq: Two times a day (BID) | INTRAMUSCULAR | Status: DC
  Administered 2023-01-10 – 2023-01-11 (×2): 5000 [IU] via SUBCUTANEOUS
  Filled 2023-01-10 (×2): qty 1

## 2023-01-10 MED ORDER — ANASTROZOLE 1 MG PO TABS: 1.0000 mg | ORAL_TABLET | Freq: Every day | ORAL | Status: DC

## 2023-01-10 MED ORDER — ASPIRIN 81 MG PO CHEW
81.0000 mg | CHEWABLE_TABLET | Freq: Every day | ORAL | Status: DC
  Filled 2023-01-10: qty 1

## 2023-01-10 MED ORDER — HYDROCHLOROTHIAZIDE 12.5 MG PO TABS
12.5000 mg | ORAL_TABLET | Freq: Every day | ORAL | Status: DC
  Administered 2023-01-11 – 2023-01-15 (×3): 12.5 mg via ORAL
  Filled 2023-01-10 (×4): qty 1

## 2023-01-10 MED ORDER — TRIAMCINOLONE ACETONIDE 55 MCG/ACT NA AERO: 2.0000 | INHALATION_SPRAY | Freq: Every day | NASAL | Status: DC

## 2023-01-10 MED ORDER — VITAMIN D (ERGOCALCIFEROL) 1.25 MG (50000 UNIT) PO CAPS: 50000.0000 [IU] | ORAL_CAPSULE | ORAL | Status: DC

## 2023-01-10 MED ORDER — HYDROMORPHONE HCL 1 MG/ML IJ SOLN
0.5000 mg | INTRAMUSCULAR | Status: DC | PRN
  Administered 2023-01-11: 0.5 mg via INTRAVENOUS
  Filled 2023-01-10: qty 0.5

## 2023-01-10 MED ORDER — MELATONIN 3 MG PO TABS
3.0000 mg | ORAL_TABLET | Freq: Every day | ORAL | Status: DC
  Administered 2023-01-11 – 2023-01-13 (×3): 3 mg via ORAL
  Filled 2023-01-10 (×5): qty 1

## 2023-01-10 MED ORDER — AMLODIPINE BESYLATE 5 MG PO TABS
10.0000 mg | ORAL_TABLET | Freq: Once | ORAL | Status: AC
  Administered 2023-01-10: 10 mg via ORAL
  Filled 2023-01-10: qty 2

## 2023-01-10 MED ORDER — DEXAMETHASONE SODIUM PHOSPHATE 4 MG/ML IJ SOLN
INTRAMUSCULAR | Status: DC | PRN
  Administered 2023-01-10: 8 mg via PERINEURAL

## 2023-01-10 MED ORDER — CLONIDINE HCL (ANALGESIA) 100 MCG/ML EP SOLN
EPIDURAL | Status: DC | PRN
  Administered 2023-01-10: 80 ug

## 2023-01-10 MED ORDER — BISACODYL 5 MG PO TBEC: 5.0000 mg | DELAYED_RELEASE_TABLET | Freq: Every day | ORAL | Status: DC | PRN

## 2023-01-10 MED ORDER — ACETAMINOPHEN 325 MG PO TABS
650.0000 mg | ORAL_TABLET | Freq: Four times a day (QID) | ORAL | Status: DC | PRN
  Administered 2023-01-11: 650 mg via ORAL
  Filled 2023-01-10: qty 2

## 2023-01-10 MED ORDER — PANTOPRAZOLE SODIUM 40 MG PO TBEC
40.0000 mg | DELAYED_RELEASE_TABLET | Freq: Every day | ORAL | Status: DC
  Administered 2023-01-10 – 2023-01-15 (×5): 40 mg via ORAL
  Filled 2023-01-10 (×5): qty 1

## 2023-01-10 MED ORDER — CYCLOSPORINE 0.05 % OP EMUL: 1.0000 [drp] | Freq: Two times a day (BID) | OPHTHALMIC | Status: DC

## 2023-01-10 MED ORDER — OXYCODONE-ACETAMINOPHEN 5-325 MG PO TABS
1.0000 | ORAL_TABLET | Freq: Once | ORAL | Status: AC
  Administered 2023-01-10: 1 via ORAL
  Filled 2023-01-10: qty 1

## 2023-01-10 MED ORDER — CARVEDILOL 25 MG PO TABS
25.0000 mg | ORAL_TABLET | Freq: Two times a day (BID) | ORAL | Status: DC
  Administered 2023-01-11 – 2023-01-15 (×9): 25 mg via ORAL
  Filled 2023-01-10 (×9): qty 1

## 2023-01-10 MED ORDER — ROPIVACAINE HCL 5 MG/ML IJ SOLN
INTRAMUSCULAR | Status: DC | PRN
  Administered 2023-01-10: 30 mL via PERINEURAL

## 2023-01-10 MED ORDER — OXYCODONE-ACETAMINOPHEN 5-325 MG PO TABS
1.0000 | ORAL_TABLET | Freq: Once | ORAL | Status: DC
  Filled 2023-01-10: qty 1

## 2023-01-10 NOTE — ED Triage Notes (Signed)
Pt came in via POV d/t her Rt hand, Rt elbow & Rt hip still bothering her since falling 4 days ago, she was seen here post fall. Rates her pain 8/10 & states she is not able to walk very well either & denies falling again since then.

## 2023-01-10 NOTE — Consult Note (Signed)
Reason for Consult:Right hip fx Referring Physician: Elayne Snare Time called: 1353 Time at bedside: 1415   Laurie Fox is an 89 y.o. female.  HPI: Laurie Fox fell over the weekend when her dog and another got tangled up and hers pulled her down. She had immediate pain in her right hip. She was brought to the ED where x-rays were read as negative. She was discharged but her hip pain continued to worsen and she returned to the ED today. X-rays showed a hip fx and orthopedic surgery was consulted. She lives at home alone and ambulates with a cane.  Past Medical History:  Diagnosis Date   Allergy    Arthritis    knees, osteo, shoulders,fingers   Breast cancer (HCC) 07/15/13   right breast    Diabetes mellitus without complication (HCC)    type II   GERD (gastroesophageal reflux disease)    History of radiation therapy 09/15/13-10/12/13   right breast   Hypertension    Personal history of radiation therapy    Wears glasses     Past Surgical History:  Procedure Laterality Date   BREAST BIOPSY Right 07/15/13   bx=mass 1 0'clock, invasive mammary ca, mammary ca in situ   BREAST LUMPECTOMY     BREAST LUMPECTOMY WITH NEEDLE LOCALIZATION AND AXILLARY SENTINEL LYMPH NODE BX Right 08/11/2013   Procedure: RIGHT BREAST LUMPECTOMY WITH NEEDLE LOCALIZATION AND AXILLARY SENTINEL LYMPH NODE BIOPSY;  Surgeon: Almond Lint, MD;  Location: [facility];  Service: General;  Laterality: Right;   COLONOSCOPY     DILATION AND CURETTAGE OF UTERUS     FRACTURE SURGERY  2006   lt foot   KNEE SURGERY  [phone]   lt and rt knee scopes   TONSILLECTOMY      Family History  Problem Relation Age of Onset   Alzheimer's disease Mother    Colon cancer Father     Social History:  reports that she has never smoked. She does not have any smokeless tobacco history on file. She reports that she does not drink alcohol and does not use drugs.  Allergies:  Allergies  Allergen Reactions    Codeine Nausea And Vomiting   Sulfa Antibiotics Rash    Medications: I have reviewed the patient's current medications.  Results for orders placed or performed during the hospital encounter of 01/10/23 (from the past 48 hour(s))  CBC with Differential     Status: Abnormal   Collection Time: 01/10/23  1:47 PM  Result Value Ref Range   WBC 12.6 (H) 4.0 - 10.5 K/uL   RBC 4.24 3.87 - 5.11 MIL/uL   Hemoglobin 11.4 (L) 12.0 - 15.0 g/dL   HCT 54.0 98.1 - 19.1 %   MCV 85.1 80.0 - 100.0 fL   MCH 26.9 26.0 - 34.0 pg   MCHC 31.6 30.0 - 36.0 g/dL   RDW 47.8 (H) 29.5 - 62.1 %   Platelets 299 150 - 400 K/uL   nRBC 0.0 0.0 - 0.2 %   Neutrophils Relative % 86 %   Neutro Abs 10.9 (H) 1.7 - 7.7 K/uL   Lymphocytes Relative 6 %   Lymphs Abs 0.8 0.7 - 4.0 K/uL   Monocytes Relative 6 %   Monocytes Absolute 0.8 0.1 - 1.0 K/uL   Eosinophils Relative 1 %   Eosinophils Absolute 0.1 0.0 - 0.5 K/uL   Basophils Relative 0 %   Basophils Absolute 0.0 0.0 - 0.1 K/uL   Immature Granulocytes 1 %  Abs Immature Granulocytes 0.07 0.00 - 0.07 K/uL    Comment: Performed at [facility], [address], [place], [place] [address]    DG Hip Unilat  With Pelvis 2-3 Views Right  Result Date: 01/10/2023 CLINICAL DATA:  Phalen sign stairs today.  Right hip pain. EXAM: DG HIP (WITH OR WITHOUT PELVIS) 2-3V RIGHT COMPARISON:  Pelvis and bilateral hip radiographs 01/06/2023 FINDINGS: There is diffuse decreased bone mineralization. There is an acute fracture of the subcapital proximal right femoral neck with approximately 9 mm superior displacement of the distal fracture, with respect to the proximal fracture component. Mild varus angulation. The fracture line is not well evaluated on lateral view due to overlying soft tissues. Mild bilateral femoroacetabular joint space narrowing. Mild bilateral sacroiliac subchondral sclerosis. Mild inferior pubic symphysis subchondral sclerosis. Vascular phleboliths overlie the pelvis.  IMPRESSION: Acute fracture of the subcapital proximal right femoral neck with approximately 9 mm superior displacement and mild varus angulation. Electronically Signed   By: Neita Garnet M.D.   On: 01/10/2023 13:34   DG Hand Complete Right  Result Date: 01/10/2023 CLINICAL DATA:  Slipped and fell on cement stairs today. Pain to right hand. EXAM: RIGHT HAND - COMPLETE 3+ VIEW COMPARISON:  None Available. FINDINGS: There is diffuse decreased bone mineralization. Degenerative changes including joint space narrowing, subchondral sclerosis/cystic change, peripheral osteophytosis are severe at the thumb carpometacarpal joint and index finger PIP and DIP joints; moderate to severe at the rest of the interphalangeal joints; moderate at the first through third metacarpophalangeal joints; and mild at the triscaphe joint. No acute fracture is seen.  No dislocation. IMPRESSION: 1. No acute fracture is seen. 2. Moderate to severe osteoarthritis, as above. Electronically Signed   By: Neita Garnet M.D.   On: 01/10/2023 13:32    Review of Systems  HENT:  Negative for ear discharge, ear pain, hearing loss and tinnitus.   Eyes:  Negative for photophobia and pain.  Respiratory:  Negative for cough and shortness of breath.   Cardiovascular:  Negative for chest pain.  Gastrointestinal:  Negative for abdominal pain, nausea and vomiting.  Genitourinary:  Negative for dysuria, flank pain, frequency and urgency.  Musculoskeletal:  Positive for arthralgias (Right hip). Negative for back pain, myalgias and neck pain.  Neurological:  Negative for dizziness and headaches.  Hematological:  Does not bruise/bleed easily.  Psychiatric/Behavioral:  The patient is not nervous/anxious.    Blood pressure (!) 192/67, pulse (!) 59, temperature 98.6 F (37 C), resp. rate 16, SpO2 97 %. Physical Exam Constitutional:      General: She is not in acute distress.    Appearance: She is well-developed. She is not diaphoretic.  HENT:      Head: Normocephalic and atraumatic.  Eyes:     General: No scleral icterus.       Right eye: No discharge.        Left eye: No discharge.     Conjunctiva/sclera: Conjunctivae normal.  Cardiovascular:     Rate and Rhythm: Normal rate and regular rhythm.  Pulmonary:     Effort: Pulmonary effort is normal. No respiratory distress.  Musculoskeletal:     Cervical back: Normal range of motion.     Comments: RLE No traumatic wounds, ecchymosis, or rash  Mod TTP hip/knee  No knee or ankle effusion  Knee stable to varus/ valgus and anterior/posterior stress  Sens DPN, SPN, TN intact  Motor EHL, ext, flex, evers 5/5  DP 2+, PT 2+, No significant  edema  Skin:    General: Skin is warm and dry.  Neurological:     Mental Status: She is alert.  Psychiatric:        Mood and Affect: Mood normal.        Behavior: Behavior normal.    Assessment/Plan: Right hip fx -- Will need hip hemi vs THA, prob former. Surgeon/timing TBD.    Freeman Caldron, PA-C Orthopedic Surgery [phone] 01/10/2023, 2:46 PM

## 2023-01-10 NOTE — ED Notes (Signed)
CBG 188 

## 2023-01-10 NOTE — Anesthesia Preprocedure Evaluation (Signed)
Anesthesia Evaluation    Reviewed: Allergy & Precautions, H&P , Patient's Chart, lab work & pertinent test results  History of Anesthesia Complications Negative for: history of anesthetic complications  Airway Mallampati: II   Neck ROM: Full    Dental  (+) Teeth Intact   Pulmonary neg pulmonary ROS   breath sounds clear to auscultation       Cardiovascular hypertension, Pt. on medications  Rhythm:Regular Rate:Normal     Neuro/Psych negative neurological ROS     GI/Hepatic ,GERD  ,,  Endo/Other  diabetes    Renal/GU Renal disease     Musculoskeletal  (+) Arthritis ,    Abdominal   Peds  Hematology   Anesthesia Other Findings   Reproductive/Obstetrics                             Anesthesia Physical Anesthesia Plan  ASA: 3  Anesthesia Plan: Regional   Post-op Pain Management: Regional block*   Induction:   PONV Risk Score and Plan:   Airway Management Planned:   Additional Equipment:   Intra-op Plan:   Post-operative Plan:   Informed Consent: I have reviewed the patients History and Physical, chart, labs and discussed the procedure including the risks, benefits and alternatives for the proposed anesthesia with the patient or authorized representative who has indicated his/her understanding and acceptance.       Plan Discussed with:   Anesthesia Plan Comments:         Anesthesia Quick Evaluation

## 2023-01-10 NOTE — Anesthesia Postprocedure Evaluation (Signed)
Anesthesia Post Note  Patient: Laurie Fox  Procedure(s) Performed: AN AD HOC NERVE BLOCK     Patient location during evaluation: PACU Anesthesia Type: Regional Pain management: pain level controlled Anesthetic complications: no Comments: Pain pre block 5 down to 2 about 5 minutes post block   No notable events documented.  Last Vitals:  Vitals:   01/10/23 2015 01/10/23 2030  BP: (!) 168/57 (!) 178/65  Pulse: 61 65  Resp: (!) 22 20  Temp:    SpO2: 92% 93%    Last Pain:  Vitals:   01/10/23 2030  PainSc: 2                  Lewie Loron

## 2023-01-10 NOTE — ED Provider Notes (Signed)
[place] EMERGENCY DEPARTMENT AT [facility] Provider Note   CSN: [id number] Arrival date & time: 01/10/23  1141     History  Chief Complaint  Patient presents with   Rt Hand Pain   Rt Elbow Pain   Rt Hip Pain    Laurie Fox is a 89 y.o. female.  Patient is an 89 year old female with past medical history of hypertension and diabetes presenting to the emergency department with right-sided pain.  Patient reports that on Sunday she was walking her dog and got pulled down the steps and fell onto her right side.  She was initially seen in the emergency department and had x-rays that showed no acute injury.  She states that she was initially able to ambulate with her walker however over the last 2 days she has had significant increase in pain and is now unable to walk due to the pain.  She states that most of her pain is in her right hip and is also having some pain in her right elbow and right side of her ribs.  She states that she has been taking Tylenol as needed for pain.  The history is provided by the patient and a relative.       Home Medications Prior to Admission medications   Medication Sig Start Date End Date Taking? Authorizing Provider  acetaminophen (TYLENOL) 325 MG tablet Take 650 mg by mouth every 6 (six) hours as needed.    [provider]  amLODipine (NORVASC) 10 MG tablet Take 10 mg by mouth daily.    [provider]  anastrozole (ARIMIDEX) 1 MG tablet TAKE 1 TABLET BY MOUTH  DAILY 10/13/20   Serena Croissant, MD  aspirin 81 MG tablet Take 81 mg by mouth daily.    [provider]  bismuth subsalicylate (PEPTO BISMOL) 262 MG chewable tablet Chew 524 mg by mouth as needed.    [provider]  carboxymethylcellulose (REFRESH PLUS) 0.5 % SOLN 1 drop 3 (three) times daily as needed.    [provider]  carvedilol (COREG) 25 MG tablet Take 25 mg by mouth 2 (two) times daily with a meal.    [provider]   cycloSPORINE (RESTASIS) 0.05 % ophthalmic emulsion 1 drop 2 (two) times daily.    [provider]  lidocaine (XYLOCAINE) 5 % ointment Apply 1 application topically as needed. 12/09/17   Serena Croissant, MD  Liniments (BLUE-EMU SUPER STRENGTH) CREA Apply 1 application topically as needed. 12/09/17   Serena Croissant, MD  MELATONIN PO Take 2.5 mg by mouth. 1/2 tablet once a day    [provider]  metFORMIN (GLUCOPHAGE) 500 MG tablet Take 500 mg by mouth 2 (two) times daily with a meal. 3 tablets in am, 2 tablets in evening    [provider]  Multiple Vitamin (MULTI-VITAMIN DAILY PO) Take by mouth.    [provider]  omeprazole (PRILOSEC) 20 MG capsule Take 20 mg by mouth daily.    [provider]  simvastatin (ZOCOR) 20 MG tablet Take 20 mg by mouth daily at 6 PM.     [provider]  telmisartan-hydrochlorothiazide (MICARDIS HCT) 40-12.5 MG per tablet Take 1 tablet by mouth daily.    [provider]  triamcinolone (NASACORT) 55 MCG/ACT AERO nasal inhaler Place 2 sprays into the nose daily. 12/09/17   Serena Croissant, MD  vitamin B-12 (CYANOCOBALAMIN) 100 MCG tablet Take 100 mcg by mouth daily.    [provider]  VITAMIN D, ERGOCALCIFEROL, PO Take by mouth.    [provider]      Allergies    Codeine and Sulfa antibiotics    Review of Systems   Review of Systems  Physical Exam Updated Vital Signs BP (!) 165/102   Pulse 82   Temp 98.6 F (37 C)   Resp 16   SpO2 97%  Physical Exam Vitals and nursing note reviewed.  Constitutional:      General: She is not in acute distress.    Appearance: Normal appearance.  HENT:     Head: Normocephalic and atraumatic.     Nose: Nose normal.     Mouth/Throat:     Mouth: Mucous membranes are moist.     Pharynx: Oropharynx is clear.  Eyes:     Extraocular Movements: Extraocular movements intact.     Conjunctiva/sclera: Conjunctivae normal.  Neck:     Comments: No midline  neck tenderness Cardiovascular:     Rate and Rhythm: Normal rate and regular rhythm.     Pulses: Normal pulses.     Heart sounds: Normal heart sounds.  Pulmonary:     Effort: Pulmonary effort is normal.     Breath sounds: Normal breath sounds.  Abdominal:     General: Abdomen is flat.     Palpations: Abdomen is soft.     Tenderness: There is no abdominal tenderness.  Musculoskeletal:     Cervical back: Normal range of motion and neck supple.     Comments: No midline back tenderness No bony tenderness to left upper or lower extremity Mild tenderness to palpation of right elbow and right fourth and fifth digits with overlying bruising, elbow flexion/extension, supination/pronation intact, grip strength intact on the right Tenderness to palpation of right sided ribs Tenderness to palpation of right hip with increased pain with internal/external rotation, right leg is shortened and held in external rotation with some mild edema of the right lower extremity  Skin:    General: Skin is warm and dry.  Neurological:     General: No focal deficit present.     Mental Status: She is alert and oriented to person, place, and time.  Psychiatric:        Mood and Affect: Mood normal.        Behavior: Behavior normal.     ED Results / Procedures / Treatments   Labs (all labs ordered are listed, but only abnormal results are displayed) Labs Reviewed  BASIC METABOLIC PANEL - Abnormal; Notable for the following components:      Result Value   Glucose, Bld 216 (*)    BUN 31 (*)    Creatinine, Ser 1.40 (*)    GFR, Estimated 36 (*)    All other components within normal limits  CBC WITH DIFFERENTIAL/PLATELET - Abnormal; Notable for the following components:   WBC 12.6 (*)    Hemoglobin 11.4 (*)    RDW 20.2 (*)    Neutro Abs 10.9 (*)    All other components within normal limits    EKG None  Radiology DG Hip Unilat  With Pelvis 2-3 Views Right  Result Date: 01/10/2023 CLINICAL DATA:   Phalen sign stairs today.  Right hip pain. EXAM: DG HIP (WITH OR WITHOUT PELVIS) 2-3V RIGHT COMPARISON:  Pelvis and bilateral hip radiographs 01/06/2023 FINDINGS: There is diffuse decreased bone mineralization. There is an acute fracture of the subcapital proximal right femoral neck with approximately 9 mm superior displacement of the distal fracture, with respect  to the proximal fracture component. Mild varus angulation. The fracture line is not well evaluated on lateral view due to overlying soft tissues. Mild bilateral femoroacetabular joint space narrowing. Mild bilateral sacroiliac subchondral sclerosis. Mild inferior pubic symphysis subchondral sclerosis. Vascular phleboliths overlie the pelvis. IMPRESSION: Acute fracture of the subcapital proximal right femoral neck with approximately 9 mm superior displacement and mild varus angulation. Electronically Signed   By: Neita Garnet M.D.   On: 01/10/2023 13:34   DG Hand Complete Right  Result Date: 01/10/2023 CLINICAL DATA:  Slipped and fell on cement stairs today. Pain to right hand. EXAM: RIGHT HAND - COMPLETE 3+ VIEW COMPARISON:  None Available. FINDINGS: There is diffuse decreased bone mineralization. Degenerative changes including joint space narrowing, subchondral sclerosis/cystic change, peripheral osteophytosis are severe at the thumb carpometacarpal joint and index finger PIP and DIP joints; moderate to severe at the rest of the interphalangeal joints; moderate at the first through third metacarpophalangeal joints; and mild at the triscaphe joint. No acute fracture is seen.  No dislocation. IMPRESSION: 1. No acute fracture is seen. 2. Moderate to severe osteoarthritis, as above. Electronically Signed   By: Neita Garnet M.D.   On: 01/10/2023 13:32    Procedures Procedures    Medications Ordered in ED Medications  oxyCODONE-acetaminophen (PERCOCET/ROXICET) 5-325 MG per tablet 1 tablet (1 tablet Oral Given 01/10/23 1355)  amLODipine (NORVASC)  tablet 10 mg (10 mg Oral Given 01/10/23 1448)  carvedilol (COREG) tablet 25 mg (25 mg Oral Given 01/10/23 1446)    ED Course/ Medical Decision Making/ A&P Clinical Course as of 01/10/23 1511  Thu Jan 10, 2023  1355 Xray with R hip fracture. I spoke with Earney Hamburg PA with orthopedics who will evaluate the patient. [VK]    Clinical Course User Index [VK] Rexford Maus, DO                             Medical Decision Making This patient presents to the ED with chief complaint(s) of R-sided pain after fall with pertinent past medical history of HTN, DM which further complicates the presenting complaint. The complaint involves an extensive differential diagnosis and also carries with it a high risk of complications and morbidity.    The differential diagnosis includes hip fracture, rib fractures, elbow fracture, hand fracture, contusion, muscle strain or spasm, dislocation  Additional history obtained: Additional history obtained from family Records reviewed recent ED records  ED Course and Reassessment: On patient's arrival to the emergency department she was hemodynamically stable in no acute distress.  She was initially seen by triage and had x-rays of her right hip, hand and elbow performed and will additionally have right-sided rib x-rays performed.  She was given Percocet for pain and will be closely reassessed.  Independent labs interpretation:  The following labs were independently interpreted: mild leukocytosis, otherwise within normal range  Independent visualization of imaging: - I independently visualized the following imaging with scope of interpretation limited to determining acute life threatening conditions related to emergency care: R-sided Rib XR, R hand/elbow, R hip XR, which revealed acute fracture of R hip  Consultation: - Consulted or discussed management/test interpretation w/ external professional: orthopedics, hospitalist  Consideration for admission or  further workup: patient requires admission for her hip fracture Social Determinants of health: N/A    Amount and/or Complexity of Data Reviewed Labs: ordered. Radiology: ordered.  Risk Prescription drug management. Decision regarding hospitalization.  Final Clinical Impression(s) / ED Diagnoses Final diagnoses:  Closed fracture of right hip, initial encounter [facility])    Rx / DC Orders ED Discharge Orders     None         Rexford Maus, DO 01/10/23 1511

## 2023-01-10 NOTE — H&P (View-Only) (Signed)
Reason for Consult:Right hip fx Referring Physician: Victoria Kingsley Time called: 1353 Time at bedside: 1415   Laurie Fox is an 89 y.o. female.  HPI: Laurie Fox fell over the weekend when her dog and another got tangled up and hers pulled her down. She had immediate pain in her right hip. She was brought to the ED where x-rays were read as negative. She was discharged but her hip pain continued to worsen and she returned to the ED today. X-rays showed a hip fx and orthopedic surgery was consulted. She lives at home alone and ambulates with a cane.  Past Medical History:  Diagnosis Date   Allergy    Arthritis    knees, osteo, shoulders,fingers   Breast cancer (HCC) 07/15/13   right breast    Diabetes mellitus without complication (HCC)    type II   GERD (gastroesophageal reflux disease)    History of radiation therapy 09/15/13-10/12/13   right breast   Hypertension    Personal history of radiation therapy    Wears glasses     Past Surgical History:  Procedure Laterality Date   BREAST BIOPSY Right 07/15/13   bx=mass 1 0'clock, invasive mammary ca, mammary ca in situ   BREAST LUMPECTOMY     BREAST LUMPECTOMY WITH NEEDLE LOCALIZATION AND AXILLARY SENTINEL LYMPH NODE BX Right 08/11/2013   Procedure: RIGHT BREAST LUMPECTOMY WITH NEEDLE LOCALIZATION AND AXILLARY SENTINEL LYMPH NODE BIOPSY;  Surgeon: Faera Byerly, MD;  Location: [facility];  Service: General;  Laterality: Right;   COLONOSCOPY     DILATION AND CURETTAGE OF UTERUS     FRACTURE SURGERY  2006   lt foot   KNEE SURGERY  1993,2007   lt and rt knee scopes   TONSILLECTOMY      Family History  Problem Relation Age of Onset   Alzheimer's disease Mother    Colon cancer Father     Social History:  reports that she has never smoked. She does not have any smokeless tobacco history on file. She reports that she does not drink alcohol and does not use drugs.  Allergies:  Allergies  Allergen Reactions    Codeine Nausea And Vomiting   Sulfa Antibiotics Rash    Medications: I have reviewed the patient's current medications.  Results for orders placed or performed during the hospital encounter of 01/10/23 (from the past 48 hour(s))  CBC with Differential     Status: Abnormal   Collection Time: 01/10/23  1:47 PM  Result Value Ref Range   WBC 12.6 (H) 4.0 - 10.5 K/uL   RBC 4.24 3.87 - 5.11 MIL/uL   Hemoglobin 11.4 (L) 12.0 - 15.0 g/dL   HCT 36.1 36.0 - 46.0 %   MCV 85.1 80.0 - 100.0 fL   MCH 26.9 26.0 - 34.0 pg   MCHC 31.6 30.0 - 36.0 g/dL   RDW 20.2 (H) 11.5 - 15.5 %   Platelets 299 150 - 400 K/uL   nRBC 0.0 0.0 - 0.2 %   Neutrophils Relative % 86 %   Neutro Abs 10.9 (H) 1.7 - 7.7 K/uL   Lymphocytes Relative 6 %   Lymphs Abs 0.8 0.7 - 4.0 K/uL   Monocytes Relative 6 %   Monocytes Absolute 0.8 0.1 - 1.0 K/uL   Eosinophils Relative 1 %   Eosinophils Absolute 0.1 0.0 - 0.5 K/uL   Basophils Relative 0 %   Basophils Absolute 0.0 0.0 - 0.1 K/uL   Immature Granulocytes 1 %     Abs Immature Granulocytes 0.07 0.00 - 0.07 K/uL    Comment: Performed at [facility], [address] [place], [place] [address]    DG Hip Unilat  With Pelvis 2-3 Views Right  Result Date: 01/10/2023 CLINICAL DATA:  Phalen sign stairs today.  Right hip pain. EXAM: DG HIP (WITH OR WITHOUT PELVIS) 2-3V RIGHT COMPARISON:  Pelvis and bilateral hip radiographs 01/06/2023 FINDINGS: There is diffuse decreased bone mineralization. There is an acute fracture of the subcapital proximal right femoral neck with approximately 9 mm superior displacement of the distal fracture, with respect to the proximal fracture component. Mild varus angulation. The fracture line is not well evaluated on lateral view due to overlying soft tissues. Mild bilateral femoroacetabular joint space narrowing. Mild bilateral sacroiliac subchondral sclerosis. Mild inferior pubic symphysis subchondral sclerosis. Vascular phleboliths overlie the pelvis.  IMPRESSION: Acute fracture of the subcapital proximal right femoral neck with approximately 9 mm superior displacement and mild varus angulation. Electronically Signed   By: Ronald  Viola M.D.   On: 01/10/2023 13:34   DG Hand Complete Right  Result Date: 01/10/2023 CLINICAL DATA:  Slipped and fell on cement stairs today. Pain to right hand. EXAM: RIGHT HAND - COMPLETE 3+ VIEW COMPARISON:  None Available. FINDINGS: There is diffuse decreased bone mineralization. Degenerative changes including joint space narrowing, subchondral sclerosis/cystic change, peripheral osteophytosis are severe at the thumb carpometacarpal joint and index finger PIP and DIP joints; moderate to severe at the rest of the interphalangeal joints; moderate at the first through third metacarpophalangeal joints; and mild at the triscaphe joint. No acute fracture is seen.  No dislocation. IMPRESSION: 1. No acute fracture is seen. 2. Moderate to severe osteoarthritis, as above. Electronically Signed   By: Ronald  Viola M.D.   On: 01/10/2023 13:32    Review of Systems  HENT:  Negative for ear discharge, ear pain, hearing loss and tinnitus.   Eyes:  Negative for photophobia and pain.  Respiratory:  Negative for cough and shortness of breath.   Cardiovascular:  Negative for chest pain.  Gastrointestinal:  Negative for abdominal pain, nausea and vomiting.  Genitourinary:  Negative for dysuria, flank pain, frequency and urgency.  Musculoskeletal:  Positive for arthralgias (Right hip). Negative for back pain, myalgias and neck pain.  Neurological:  Negative for dizziness and headaches.  Hematological:  Does not bruise/bleed easily.  Psychiatric/Behavioral:  The patient is not nervous/anxious.    Blood pressure (!) 192/67, pulse (!) 59, temperature 98.6 F (37 C), resp. rate 16, SpO2 97 %. Physical Exam Constitutional:      General: She is not in acute distress.    Appearance: She is well-developed. She is not diaphoretic.  HENT:      Head: Normocephalic and atraumatic.  Eyes:     General: No scleral icterus.       Right eye: No discharge.        Left eye: No discharge.     Conjunctiva/sclera: Conjunctivae normal.  Cardiovascular:     Rate and Rhythm: Normal rate and regular rhythm.  Pulmonary:     Effort: Pulmonary effort is normal. No respiratory distress.  Musculoskeletal:     Cervical back: Normal range of motion.     Comments: RLE No traumatic wounds, ecchymosis, or rash  Mod TTP hip/knee  No knee or ankle effusion  Knee stable to varus/ valgus and anterior/posterior stress  Sens DPN, SPN, TN intact  Motor EHL, ext, flex, evers 5/5  DP 2+, PT 2+, No significant   edema  Skin:    General: Skin is warm and dry.  Neurological:     Mental Status: She is alert.  Psychiatric:        Mood and Affect: Mood normal.        Behavior: Behavior normal.    Assessment/Plan: Right hip fx -- Will need hip hemi vs THA, prob former. Surgeon/timing TBD.    Keosha Rossa J. Meet [place], PA-C Orthopedic Surgery [phone] 01/10/2023, 2:46 PM  

## 2023-01-10 NOTE — ED Notes (Signed)
ED TO INPATIENT HANDOFF REPORT  ED Nurse Name and Phone #: [phone]  S Name/Age/Gender Laurie Fox 89 y.o. female Room/Bed: 025C/025C  Code Status   Code Status: Full Code  Home/SNF/Other Home Patient oriented to: self, place, time, and situation Is this baseline? Yes   Triage Complete: Triage complete  Chief Complaint Hip fracture (HCC) [S72.009A]  Triage Note Pt came in via POV d/t her Rt hand, Rt elbow & Rt hip still bothering her since falling 4 days ago, she was seen here post fall. Rates her pain 8/10 & states she is not able to walk very well either & denies falling again since then.    Allergies Allergies  Allergen Reactions   Codeine Nausea And Vomiting   Sulfa Antibiotics Rash    Level of Care/Admitting Diagnosis ED Disposition     ED Disposition  Admit   Condition  --   Comment  Hospital Area: [facility] [[id number]]  Level of Care: Med-Surg [16]  May admit patient to [facility] or [facility] if equivalent level of care is available:: No  Covid Evaluation: Asymptomatic - no recent exposure (last 10 days) testing not required  Diagnosis: Hip fracture [facility]) [[id number]]  Admitting Physician: Emeline General [[id number]]  Attending Physician: Emeline General [[id number]]  Certification:: I certify this patient will need inpatient services for at least 2 midnights  Estimated Length of Stay: 2          B Medical/Surgery History Past Medical History:  Diagnosis Date   Allergy    Arthritis    knees, osteo, shoulders,fingers   Breast cancer (HCC) 07/15/13   right breast    Diabetes mellitus without complication (HCC)    type II   GERD (gastroesophageal reflux disease)    History of radiation therapy 09/15/13-10/12/13   right breast   Hypertension    Personal history of radiation therapy    Wears glasses    Past Surgical History:  Procedure Laterality Date   BREAST BIOPSY Right 07/15/13   bx=mass 1 0'clock, invasive mammary ca, mammary ca  in situ   BREAST LUMPECTOMY     BREAST LUMPECTOMY WITH NEEDLE LOCALIZATION AND AXILLARY SENTINEL LYMPH NODE BX Right 08/11/2013   Procedure: RIGHT BREAST LUMPECTOMY WITH NEEDLE LOCALIZATION AND AXILLARY SENTINEL LYMPH NODE BIOPSY;  Surgeon: Almond Lint, MD;  Location: [facility];  Service: General;  Laterality: Right;   COLONOSCOPY     DILATION AND CURETTAGE OF UTERUS     FRACTURE SURGERY  2006   lt foot   KNEE SURGERY  [phone]   lt and rt knee scopes   TONSILLECTOMY       A IV Location/Drains/Wounds Patient Lines/Drains/Airways Status     Active Line/Drains/Airways     Name Placement date Placement time Site Days   Peripheral IV 01/10/23 20 G Anterior;Distal;Left Forearm 01/10/23  1326  Forearm  less than 1            Intake/Output Last 24 hours No intake or output data in the 24 hours ending 01/10/23 2037  Labs/Imaging Results for orders placed or performed during the hospital encounter of 01/10/23 (from the past 48 hour(s))  Basic metabolic panel     Status: Abnormal   Collection Time: 01/10/23  1:47 PM  Result Value Ref Range   Sodium 137 135 - 145 mmol/L   Potassium 4.0 3.5 - 5.1 mmol/L   Chloride 99 98 - 111 mmol/L   CO2 26 22 -  32 mmol/L   Glucose, Bld 216 (H) 70 - 99 mg/dL    Comment: Glucose reference range applies only to samples taken after fasting for at least 8 hours.   BUN 31 (H) 8 - 23 mg/dL   Creatinine, Ser 1.61 (H) 0.44 - 1.00 mg/dL   Calcium 9.8 8.9 - 09.6 mg/dL   GFR, Estimated 36 (L) >60 mL/min    Comment: (NOTE) Calculated using the CKD-EPI Creatinine Equation (2021)    Anion gap 12 5 - 15    Comment: Performed at [facility], [address], [place], [place] [address]  CBC with Differential     Status: Abnormal   Collection Time: 01/10/23  1:47 PM  Result Value Ref Range   WBC 12.6 (H) 4.0 - 10.5 K/uL   RBC 4.24 3.87 - 5.11 MIL/uL   Hemoglobin 11.4 (L) 12.0 - 15.0 g/dL   HCT 98.1 19.1 - 47.8 %   MCV 85.1 80.0 -  100.0 fL   MCH 26.9 26.0 - 34.0 pg   MCHC 31.6 30.0 - 36.0 g/dL   RDW 29.5 (H) 62.1 - 30.8 %   Platelets 299 150 - 400 K/uL   nRBC 0.0 0.0 - 0.2 %   Neutrophils Relative % 86 %   Neutro Abs 10.9 (H) 1.7 - 7.7 K/uL   Lymphocytes Relative 6 %   Lymphs Abs 0.8 0.7 - 4.0 K/uL   Monocytes Relative 6 %   Monocytes Absolute 0.8 0.1 - 1.0 K/uL   Eosinophils Relative 1 %   Eosinophils Absolute 0.1 0.0 - 0.5 K/uL   Basophils Relative 0 %   Basophils Absolute 0.0 0.0 - 0.1 K/uL   Immature Granulocytes 1 %   Abs Immature Granulocytes 0.07 0.00 - 0.07 K/uL    Comment: Performed at [facility], [address], [place], [place] [address]  Hemoglobin A1c     Status: Abnormal   Collection Time: 01/10/23  3:39 PM  Result Value Ref Range   Hgb A1c MFr Bld 6.7 (H) 4.8 - 5.6 %    Comment: (NOTE) Pre diabetes:          5.7%-6.4%  Diabetes:              >6.4%  Glycemic control for   <7.0% adults with diabetes    Mean Plasma Glucose 145.59 mg/dL    Comment: Performed at [facility], [address] [place], [place] [address]  CBG monitoring, ED     Status: Abnormal   Collection Time: 01/10/23  4:36 PM  Result Value Ref Range   Glucose-Capillary 188 (H) 70 - 99 mg/dL    Comment: Glucose reference range applies only to samples taken after fasting for at least 8 hours.   DG Ribs Unilateral W/Chest Right  Result Date: 01/10/2023 CLINICAL DATA:  Right rib pain after fall. EXAM: RIGHT RIBS AND CHEST - 3+ VIEW COMPARISON:  None Available. FINDINGS: No fracture or other bone lesions are seen involving the ribs. There is no evidence of pneumothorax or pleural effusion. Both lungs are clear. Heart size and mediastinal contours are within normal limits. IMPRESSION: Negative. Electronically Signed   By: Lupita Raider M.D.   On: 01/10/2023 15:13   DG Elbow Complete Right  Result Date: 01/10/2023 CLINICAL DATA:  Fall on cement stairs today.  Right elbow. EXAM: RIGHT ELBOW - COMPLETE 3+ VIEW  COMPARISON:  Right elbow radiographs 01/06/2023 FINDINGS: There is diffuse decreased bone mineralization. Unchanged well  corticated likely small loose body overlying the radiocapitellar joint. Mild to moderate medial elbow joint space narrowing and peripheral osteophytosis at the trochlea-coronoid process. Mild radiocapitellar joint space narrowing. Mild chronic enthesopathic change at the common extensor greater than common flexor tendon origins. Mild degenerative spurring at the radial tuberosity at the biceps tendon insertion is unchanged. Minimal mineralization of the triceps tendon insertion on the olecranon is unchanged from prior. There is again posterior elbow soft tissue swelling. No definite elbow joint effusion. IMPRESSION: 1. No significant change from recent 01/06/2023 elbow radiographs. 2. Posterior elbow soft tissue swelling. Mild age indeterminate mineralization at the triceps insertion on the olecranon, an age indeterminate avulsion injury versus enthesopathic change as previously described. There is posterior elbow soft tissue sign which may be secondary to the patient's impact fall. 3. Mild-to-moderate medial elbow and mild radiocapitellar osteoarthritis. Electronically Signed   By: Neita Garnet M.D.   On: 01/10/2023 13:52   DG Hip Unilat  With Pelvis 2-3 Views Right  Result Date: 01/10/2023 CLINICAL DATA:  Phalen sign stairs today.  Right hip pain. EXAM: DG HIP (WITH OR WITHOUT PELVIS) 2-3V RIGHT COMPARISON:  Pelvis and bilateral hip radiographs 01/06/2023 FINDINGS: There is diffuse decreased bone mineralization. There is an acute fracture of the subcapital proximal right femoral neck with approximately 9 mm superior displacement of the distal fracture, with respect to the proximal fracture component. Mild varus angulation. The fracture line is not well evaluated on lateral view due to overlying soft tissues. Mild bilateral femoroacetabular joint space narrowing. Mild bilateral sacroiliac  subchondral sclerosis. Mild inferior pubic symphysis subchondral sclerosis. Vascular phleboliths overlie the pelvis. IMPRESSION: Acute fracture of the subcapital proximal right femoral neck with approximately 9 mm superior displacement and mild varus angulation. Electronically Signed   By: Neita Garnet M.D.   On: 01/10/2023 13:34   DG Hand Complete Right  Result Date: 01/10/2023 CLINICAL DATA:  Slipped and fell on cement stairs today. Pain to right hand. EXAM: RIGHT HAND - COMPLETE 3+ VIEW COMPARISON:  None Available. FINDINGS: There is diffuse decreased bone mineralization. Degenerative changes including joint space narrowing, subchondral sclerosis/cystic change, peripheral osteophytosis are severe at the thumb carpometacarpal joint and index finger PIP and DIP joints; moderate to severe at the rest of the interphalangeal joints; moderate at the first through third metacarpophalangeal joints; and mild at the triscaphe joint. No acute fracture is seen.  No dislocation. IMPRESSION: 1. No acute fracture is seen. 2. Moderate to severe osteoarthritis, as above. Electronically Signed   By: Neita Garnet M.D.   On: 01/10/2023 13:32    Pending Labs Unresulted Labs (From admission, onward)     Start     Ordered   01/10/23 1637  Creatinine, urine, random  Once,   R        01/10/23 1636   01/10/23 1637  Sodium, urine, random  Once,   R        01/10/23 1636            Vitals/Pain Today's Vitals   01/10/23 1945 01/10/23 2000 01/10/23 2015 01/10/23 2030  BP: (!) 182/162 (!) 171/56 (!) 168/57 (!) 178/65  Pulse: 68 61 61 65  Resp: 19 (!) 24 (!) 22 20  Temp:      SpO2: 92% 92% 92% 93%  PainSc:    2     Isolation Precautions No active isolations  Medications Medications  acetaminophen (TYLENOL) tablet 650 mg (has no administration in time range)  carvedilol (COREG) tablet 25  mg (has no administration in time range)  simvastatin (ZOCOR) tablet 20 mg (has no administration in time range)   pantoprazole (PROTONIX) EC tablet 40 mg (has no administration in time range)  melatonin tablet 3 mg (has no administration in time range)  Vitamin D (Ergocalciferol) (DRISDOL) 1.25 MG (50000 UNIT) capsule 50,000 Units (has no administration in time range)  triamcinolone (NASACORT) nasal inhaler 2 spray (has no administration in time range)  carboxymethylcellulose (REFRESH PLUS) 0.5 % ophthalmic solution 1 drop (has no administration in time range)  cycloSPORINE (RESTASIS) 0.05 % ophthalmic emulsion 1 drop (has no administration in time range)  HYDROcodone-acetaminophen (NORCO/VICODIN) 5-325 MG per tablet 1-2 tablet (has no administration in time range)  heparin injection 5,000 Units (has no administration in time range)  HYDROmorphone (DILAUDID) injection 0.5 mg (has no administration in time range)  senna-docusate (Senokot-S) tablet 1 tablet (has no administration in time range)  bisacodyl (DULCOLAX) EC tablet 5 mg (has no administration in time range)  insulin aspart (novoLOG) injection 0-9 Units (has no administration in time range)  hydrALAZINE (APRESOLINE) injection 5 mg (has no administration in time range)  aspirin chewable tablet 81 mg (has no administration in time range)  irbesartan (AVAPRO) tablet 150 mg (has no administration in time range)    And  hydrochlorothiazide (HYDRODIURIL) tablet 12.5 mg (has no administration in time range)  oxyCODONE-acetaminophen (PERCOCET/ROXICET) 5-325 MG per tablet 1 tablet (1 tablet Oral Given 01/10/23 1355)  amLODipine (NORVASC) tablet 10 mg (10 mg Oral Given 01/10/23 1448)  carvedilol (COREG) tablet 25 mg (25 mg Oral Given 01/10/23 1446)    Mobility non-ambulatory     Focused Assessments musculoskeletal   R Recommendations: See Admitting Provider Note  Report given to:   Additional Notes: Pt received right hip block in PACU. Returned to ED at 2040.

## 2023-01-10 NOTE — Anesthesia Procedure Notes (Signed)
Anesthesia Regional Block: Femoral nerve block   Pre-Anesthetic Checklist: , timeout performed,  Correct Patient, Correct Site, Correct Laterality,  Correct Procedure, Correct Position, site marked,  Risks and benefits discussed,  Surgical consent,  Pre-op evaluation,  At surgeon's request and post-op pain management  Laterality: Lower and Right  Prep: chloraprep       Needles:  Injection technique: Single-shot  Needle Type: Stimulator Needle - 80     Needle Length: 9cm  Needle Gauge: 22   Needle insertion depth: 6 cm   Additional Needles:   Procedures:,,,, ultrasound used (permanent image in chart),,     Nerve Stimulator or Paresthesia:  Response: Patellar snap, 0.5 mA  Additional Responses:   Narrative:  Start time: 01/10/2023 8:14 PM End time: 01/10/2023 8:35 PM Injection made incrementally with aspirations every 5 mL.  Performed by: Personally  Anesthesiologist: Lewie Loron, MD  Additional Notes: BP cuff, EKG monitors applied. Sedation begun. Femoral artery palpated for location of nerve. After nerve location verified with U/S, anesthetic injected incrementally, slowly, and after negative aspirations under direct u/s guidance. Good perineural spread. Patient tolerated well.

## 2023-01-10 NOTE — H&P (Signed)
History and Physical    Laurie Fox ZOX:[id number] DOB: [birth year] DOA: 01/10/2023  PCP: Kirby Funk, MD (Inactive) (Confirm with patient/family/NH records and if not entered, this has to be entered at [facility] point of entry) Patient coming from: Home  I have personally briefly reviewed patient's old medical records in [facility]  Chief Complaint: Left hip pain  HPI: Laurie Fox is a 89 y.o. female with medical history significant of breast cancer s/p lumpectomy and radiation therapy on chronic anastrozole, HTN, HLD, IIDM, multiple OA, presented with mechanical fall and right hip pain.  Patient was walking her dog on Sunday afternoon and dog suddenly dragged the patient, patient fell on her right hip.  Patient came to ED.  Sunday afternoon, trauma scan including right hip negative for fracture or dislocation and patient was given pain medication and sent home.  Over the weekend, patient started to have increasing pain associated with right hip ambulation and unable to put weight on right hip since yesterday because of excruciating pain.  And family brought her back to ED today.  At baseline patient lives alone in a two-story house able to climb 13 steps to upstairs with no shortness of breath or chest pain and she is able to walk her dog for 20-30 minutes without chest pain or shortness of breath.  No history of stroke or CAD, she only takes metformin for her diabetes  ED Course: Afebrile, borderline bradycardia blood pressure elevated nonhypoxic.  Trauma scan including right hip showing displaced right femoral neck fracture, no fracture found on ribs or elbow.  Blood work showed creatinine 1.4 compared to baseline 0.8 about 8 years ago, glucose 216, K4.0  Review of Systems: As per HPI otherwise 14 point review of systems negative.    Past Medical History:  Diagnosis Date   Allergy    Arthritis    knees, osteo, shoulders,fingers   Breast cancer (HCC) 07/15/13   right breast     Diabetes mellitus without complication (HCC)    type II   GERD (gastroesophageal reflux disease)    History of radiation therapy 09/15/13-10/12/13   right breast   Hypertension    Personal history of radiation therapy    Wears glasses     Past Surgical History:  Procedure Laterality Date   BREAST BIOPSY Right 07/15/13   bx=mass 1 0'clock, invasive mammary ca, mammary ca in situ   BREAST LUMPECTOMY     BREAST LUMPECTOMY WITH NEEDLE LOCALIZATION AND AXILLARY SENTINEL LYMPH NODE BX Right 08/11/2013   Procedure: RIGHT BREAST LUMPECTOMY WITH NEEDLE LOCALIZATION AND AXILLARY SENTINEL LYMPH NODE BIOPSY;  Surgeon: Almond Lint, MD;  Location: [facility];  Service: General;  Laterality: Right;   COLONOSCOPY     DILATION AND CURETTAGE OF UTERUS     FRACTURE SURGERY  2006   lt foot   KNEE SURGERY  [phone]   lt and rt knee scopes   TONSILLECTOMY       reports that she has never smoked. She does not have any smokeless tobacco history on file. She reports that she does not drink alcohol and does not use drugs.  Allergies  Allergen Reactions   Codeine Nausea And Vomiting   Sulfa Antibiotics Rash    Family History  Problem Relation Age of Onset   Alzheimer's disease Mother    Colon cancer Father      Prior to Admission medications   Medication Sig Start Date End Date Taking? Authorizing Provider  acetaminophen (  TYLENOL) 325 MG tablet Take 650 mg by mouth every 6 (six) hours as needed.    [provider]  amLODipine (NORVASC) 10 MG tablet Take 10 mg by mouth daily.    [provider]  anastrozole (ARIMIDEX) 1 MG tablet TAKE 1 TABLET BY MOUTH  DAILY 10/13/20   Serena Croissant, MD  aspirin 81 MG tablet Take 81 mg by mouth daily.    [provider]  bismuth subsalicylate (PEPTO BISMOL) 262 MG chewable tablet Chew 524 mg by mouth as needed.    [provider]  carboxymethylcellulose (REFRESH PLUS) 0.5 % SOLN 1 drop 3 (three) times daily as  needed.    [provider]  carvedilol (COREG) 25 MG tablet Take 25 mg by mouth 2 (two) times daily with a meal.    [provider]  cycloSPORINE (RESTASIS) 0.05 % ophthalmic emulsion 1 drop 2 (two) times daily.    [provider]  lidocaine (XYLOCAINE) 5 % ointment Apply 1 application topically as needed. 12/09/17   Serena Croissant, MD  Liniments (BLUE-EMU SUPER STRENGTH) CREA Apply 1 application topically as needed. 12/09/17   Serena Croissant, MD  MELATONIN PO Take 2.5 mg by mouth. 1/2 tablet once a day    [provider]  metFORMIN (GLUCOPHAGE) 500 MG tablet Take 500 mg by mouth 2 (two) times daily with a meal. 3 tablets in am, 2 tablets in evening    [provider]  Multiple Vitamin (MULTI-VITAMIN DAILY PO) Take by mouth.    [provider]  omeprazole (PRILOSEC) 20 MG capsule Take 20 mg by mouth daily.    [provider]  simvastatin (ZOCOR) 20 MG tablet Take 20 mg by mouth daily at 6 PM.     [provider]  telmisartan-hydrochlorothiazide (MICARDIS HCT) 40-12.5 MG per tablet Take 1 tablet by mouth daily.    [provider]  triamcinolone (NASACORT) 55 MCG/ACT AERO nasal inhaler Place 2 sprays into the nose daily. 12/09/17   Serena Croissant, MD  vitamin B-12 (CYANOCOBALAMIN) 100 MCG tablet Take 100 mcg by mouth daily.    [provider]  VITAMIN D, ERGOCALCIFEROL, PO Take by mouth.    [provider]    Physical Exam: Vitals:   01/10/23 1415 01/10/23 1445 01/10/23 1515 01/10/23 1533  BP: (!) 192/67 (!) 165/102 (!) 152/84   Pulse: (!) 59 82 (!) 57   Resp:   16   Temp:    98.5 F (36.9 C)  SpO2: 97% 97% 96%     Constitutional: NAD, calm, comfortable Vitals:   01/10/23 1415 01/10/23 1445 01/10/23 1515 01/10/23 1533  BP: (!) 192/67 (!) 165/102 (!) 152/84   Pulse: (!) 59 82 (!) 57   Resp:   16   Temp:    98.5 F (36.9 C)  SpO2: 97% 97% 96%    Eyes: PERRL, lids and conjunctivae normal ENMT:  Mucous membranes are moist. Posterior pharynx clear of any exudate or lesions.Normal dentition.  Neck: normal, supple, no masses, no thyromegaly Respiratory: clear to auscultation bilaterally, no wheezing, no crackles. Normal respiratory effort. No accessory muscle use.  Cardiovascular: Regular rate and rhythm, no murmurs / rubs / gallops. No extremity edema. 2+ pedal pulses. No carotid bruits.  Abdomen: no tenderness, no masses palpated. No hepatosplenomegaly. Bowel sounds positive.  Musculoskeletal: Right leg shortened and rotated Skin: no rashes, lesions, ulcers. No induration Neurologic: CN 2-12 grossly intact. Sensation intact, DTR normal. Strength 5/5 in all 4.  Psychiatric: Normal judgment  and insight. Alert and oriented x 3. Normal mood.     Labs on Admission: I have personally reviewed following labs and imaging studies  CBC: Recent Labs  Lab 01/10/23 1347  WBC 12.6*  NEUTROABS 10.9*  HGB 11.4*  HCT 36.1  MCV 85.1  PLT 299   Basic Metabolic Panel: Recent Labs  Lab 01/10/23 1347  NA 137  K 4.0  CL 99  CO2 26  GLUCOSE 216*  BUN 31*  CREATININE 1.40*  CALCIUM 9.8   GFR: CrCl cannot be calculated (Unknown ideal weight.). Liver Function Tests: No results for input(s): "AST", "ALT", "ALKPHOS", "BILITOT", "PROT", "ALBUMIN" in the last 168 hours. No results for input(s): "LIPASE", "AMYLASE" in the last 168 hours. No results for input(s): "AMMONIA" in the last 168 hours. Coagulation Profile: No results for input(s): "INR", "PROTIME" in the last 168 hours. Cardiac Enzymes: No results for input(s): "CKTOTAL", "CKMB", "CKMBINDEX", "TROPONINI" in the last 168 hours. BNP (last 3 results) No results for input(s): "PROBNP" in the last 8760 hours. HbA1C: No results for input(s): "HGBA1C" in the last 72 hours. CBG: No results for input(s): "GLUCAP" in the last 168 hours. Lipid Profile: No results for input(s): "CHOL", "HDL", "LDLCALC", "TRIG", "CHOLHDL", "LDLDIRECT" in  the last 72 hours. Thyroid Function Tests: No results for input(s): "TSH", "T4TOTAL", "FREET4", "T3FREE", "THYROIDAB" in the last 72 hours. Anemia Panel: No results for input(s): "VITAMINB12", "FOLATE", "FERRITIN", "TIBC", "IRON", "RETICCTPCT" in the last 72 hours. Urine analysis: No results found for: "COLORURINE", "APPEARANCEUR", "LABSPEC", "PHURINE", "GLUCOSEU", "HGBUR", "BILIRUBINUR", "KETONESUR", "PROTEINUR", "UROBILINOGEN", "NITRITE", "LEUKOCYTESUR"  Radiological Exams on Admission: DG Hip Unilat  With Pelvis 2-3 Views Right  Result Date: 01/10/2023 CLINICAL DATA:  Phalen sign stairs today.  Right hip pain. EXAM: DG HIP (WITH OR WITHOUT PELVIS) 2-3V RIGHT COMPARISON:  Pelvis and bilateral hip radiographs 01/06/2023 FINDINGS: There is diffuse decreased bone mineralization. There is an acute fracture of the subcapital proximal right femoral neck with approximately 9 mm superior displacement of the distal fracture, with respect to the proximal fracture component. Mild varus angulation. The fracture line is not well evaluated on lateral view due to overlying soft tissues. Mild bilateral femoroacetabular joint space narrowing. Mild bilateral sacroiliac subchondral sclerosis. Mild inferior pubic symphysis subchondral sclerosis. Vascular phleboliths overlie the pelvis. IMPRESSION: Acute fracture of the subcapital proximal right femoral neck with approximately 9 mm superior displacement and mild varus angulation. Electronically Signed   By: Neita Garnet M.D.   On: 01/10/2023 13:34   DG Hand Complete Right  Result Date: 01/10/2023 CLINICAL DATA:  Slipped and fell on cement stairs today. Pain to right hand. EXAM: RIGHT HAND - COMPLETE 3+ VIEW COMPARISON:  None Available. FINDINGS: There is diffuse decreased bone mineralization. Degenerative changes including joint space narrowing, subchondral sclerosis/cystic change, peripheral osteophytosis are severe at the thumb carpometacarpal joint and index finger  PIP and DIP joints; moderate to severe at the rest of the interphalangeal joints; moderate at the first through third metacarpophalangeal joints; and mild at the triscaphe joint. No acute fracture is seen.  No dislocation. IMPRESSION: 1. No acute fracture is seen. 2. Moderate to severe osteoarthritis, as above. Electronically Signed   By: Neita Garnet M.D.   On: 01/10/2023 13:32    EKG: Independently reviewed.  Sinus bradycardia, no QTc or PR interval changes  Assessment/Plan Principal Problem:   Hip fracture (HCC) Active Problems:   Breast cancer of upper-inner quadrant of right female breast (HCC)   Closed displaced fracture of right femoral neck (  HCC)   HTN (hypertension)   Type 2 diabetes mellitus with chronic kidney disease, without long-term current use of insulin (HCC)  (please populate well all problems here in Problem List. (For example, if patient is on BP meds at home and you resume or decide to hold them, it is a problem that needs to be her. Same for CAD, COPD, HLD and so on)  Right femoral neck fracture -Secondary to mechanical fall -ORIF tomorrow -IV Dilaudid for pain control, consult anesthesiology for nerve block -At baseline patient is able to tolerate 4 METs activity without any symptoms of shortness of breath or chest pain, medically cleared for tomorrow's ORIF with general anesthesia with acceptable cardiac risk.  No ischemic workup indicated at this point. Routine Echo ordered.  AKI vs CKD  -No recent kidney function baseline reading in the last 8 years -Clinically appears to be euvolemic no electrolytes or bicarb abnormalities -Check FeNa.   HTN, uncontrolled -Continue Coreg and amlodipine and HCTZ/ARB -Add as needed hydralazine  IIDM -Hold off metformin, start SSI  History of breast cancer stage I -S/p radiation and lumpectomy -On chronic anastrozole  DVT prophylaxis: Heparin subQ Code Status: Full code Family Communication: Daughter at  bedside Disposition Plan: Patient is sick with right hip fracture requiring ORIF, will likely need more than 2 midnight hospital stay Consults called: ORtho Admission status: MedSurg admission   Emeline General MD [facility] Pager [phone]  01/10/2023, 4:08 PM

## 2023-01-10 NOTE — ED Notes (Signed)
ED TO INPATIENT HANDOFF REPORT  ED Nurse Name and Phone #: [phone]  S Name/Age/Gender Marney Doctor 89 y.o. female Room/Bed: 025C/025C  Code Status   Code Status: Full Code  Home/SNF/Other Home Patient oriented to: self, place, time, and situation Is this baseline? Yes   Triage Complete: Triage complete  Chief Complaint Hip fracture (HCC) [S72.009A]  Triage Note Pt came in via POV d/t her Rt hand, Rt elbow & Rt hip still bothering her since falling 4 days ago, she was seen here post fall. Rates her pain 8/10 & states she is not able to walk very well either & denies falling again since then.    Allergies Allergies  Allergen Reactions   Codeine Nausea And Vomiting   Sulfa Antibiotics Rash    Level of Care/Admitting Diagnosis ED Disposition     ED Disposition  Admit   Condition  --   Comment  Hospital Area: [facility] [[id number]]  Level of Care: Med-Surg [16]  May admit patient to [facility] or [facility] if equivalent level of care is available:: No  Covid Evaluation: Asymptomatic - no recent exposure (last 10 days) testing not required  Diagnosis: Hip fracture [facility]) [[id number]]  Admitting Physician: Emeline General [[id number]]  Attending Physician: Emeline General [[id number]]  Certification:: I certify this patient will need inpatient services for at least 2 midnights  Estimated Length of Stay: 2          B Medical/Surgery History Past Medical History:  Diagnosis Date   Allergy    Arthritis    knees, osteo, shoulders,fingers   Breast cancer (HCC) 07/15/13   right breast    Diabetes mellitus without complication (HCC)    type II   GERD (gastroesophageal reflux disease)    History of radiation therapy 09/15/13-10/12/13   right breast   Hypertension    Personal history of radiation therapy    Wears glasses    Past Surgical History:  Procedure Laterality Date   BREAST BIOPSY Right 07/15/13   bx=mass 1 0'clock, invasive mammary ca, mammary ca  in situ   BREAST LUMPECTOMY     BREAST LUMPECTOMY WITH NEEDLE LOCALIZATION AND AXILLARY SENTINEL LYMPH NODE BX Right 08/11/2013   Procedure: RIGHT BREAST LUMPECTOMY WITH NEEDLE LOCALIZATION AND AXILLARY SENTINEL LYMPH NODE BIOPSY;  Surgeon: Almond Lint, MD;  Location: [facility];  Service: General;  Laterality: Right;   COLONOSCOPY     DILATION AND CURETTAGE OF UTERUS     FRACTURE SURGERY  2006   lt foot   KNEE SURGERY  [phone]   lt and rt knee scopes   TONSILLECTOMY       A IV Location/Drains/Wounds Patient Lines/Drains/Airways Status     Active Line/Drains/Airways     Name Placement date Placement time Site Days   Peripheral IV 01/10/23 20 G Anterior;Distal;Left Forearm 01/10/23  1326  Forearm  less than 1            Intake/Output Last 24 hours No intake or output data in the 24 hours ending 01/10/23 1558  Labs/Imaging Results for orders placed or performed during the hospital encounter of 01/10/23 (from the past 48 hour(s))  Basic metabolic panel     Status: Abnormal   Collection Time: 01/10/23  1:47 PM  Result Value Ref Range   Sodium 137 135 - 145 mmol/L   Potassium 4.0 3.5 - 5.1 mmol/L   Chloride 99 98 - 111 mmol/L   CO2 26 22 -  32 mmol/L   Glucose, Bld 216 (H) 70 - 99 mg/dL    Comment: Glucose reference range applies only to samples taken after fasting for at least 8 hours.   BUN 31 (H) 8 - 23 mg/dL   Creatinine, Ser 1.61 (H) 0.44 - 1.00 mg/dL   Calcium 9.8 8.9 - 09.6 mg/dL   GFR, Estimated 36 (L) >60 mL/min    Comment: (NOTE) Calculated using the CKD-EPI Creatinine Equation (2021)    Anion gap 12 5 - 15    Comment: Performed at [facility], [address], [place], [place] [address]  CBC with Differential     Status: Abnormal   Collection Time: 01/10/23  1:47 PM  Result Value Ref Range   WBC 12.6 (H) 4.0 - 10.5 K/uL   RBC 4.24 3.87 - 5.11 MIL/uL   Hemoglobin 11.4 (L) 12.0 - 15.0 g/dL   HCT 98.1 19.1 - 47.8 %   MCV 85.1 80.0 -  100.0 fL   MCH 26.9 26.0 - 34.0 pg   MCHC 31.6 30.0 - 36.0 g/dL   RDW 29.5 (H) 62.1 - 30.8 %   Platelets 299 150 - 400 K/uL   nRBC 0.0 0.0 - 0.2 %   Neutrophils Relative % 86 %   Neutro Abs 10.9 (H) 1.7 - 7.7 K/uL   Lymphocytes Relative 6 %   Lymphs Abs 0.8 0.7 - 4.0 K/uL   Monocytes Relative 6 %   Monocytes Absolute 0.8 0.1 - 1.0 K/uL   Eosinophils Relative 1 %   Eosinophils Absolute 0.1 0.0 - 0.5 K/uL   Basophils Relative 0 %   Basophils Absolute 0.0 0.0 - 0.1 K/uL   Immature Granulocytes 1 %   Abs Immature Granulocytes 0.07 0.00 - 0.07 K/uL    Comment: Performed at [facility], [address], [place], [place] [address]   DG Hip Unilat  With Pelvis 2-3 Views Right  Result Date: 01/10/2023 CLINICAL DATA:  Phalen sign stairs today.  Right hip pain. EXAM: DG HIP (WITH OR WITHOUT PELVIS) 2-3V RIGHT COMPARISON:  Pelvis and bilateral hip radiographs 01/06/2023 FINDINGS: There is diffuse decreased bone mineralization. There is an acute fracture of the subcapital proximal right femoral neck with approximately 9 mm superior displacement of the distal fracture, with respect to the proximal fracture component. Mild varus angulation. The fracture line is not well evaluated on lateral view due to overlying soft tissues. Mild bilateral femoroacetabular joint space narrowing. Mild bilateral sacroiliac subchondral sclerosis. Mild inferior pubic symphysis subchondral sclerosis. Vascular phleboliths overlie the pelvis. IMPRESSION: Acute fracture of the subcapital proximal right femoral neck with approximately 9 mm superior displacement and mild varus angulation. Electronically Signed   By: Neita Garnet M.D.   On: 01/10/2023 13:34   DG Hand Complete Right  Result Date: 01/10/2023 CLINICAL DATA:  Slipped and fell on cement stairs today. Pain to right hand. EXAM: RIGHT HAND - COMPLETE 3+ VIEW COMPARISON:  None Available. FINDINGS: There is diffuse decreased bone mineralization. Degenerative changes  including joint space narrowing, subchondral sclerosis/cystic change, peripheral osteophytosis are severe at the thumb carpometacarpal joint and index finger PIP and DIP joints; moderate to severe at the rest of the interphalangeal joints; moderate at the first through third metacarpophalangeal joints; and mild at the triscaphe joint. No acute fracture is seen.  No dislocation. IMPRESSION: 1. No acute fracture is seen. 2. Moderate to severe osteoarthritis, as above. Electronically Signed   By: Neita Garnet M.D.   On:  01/10/2023 13:32    Pending Labs Unresulted Labs (From admission, onward)     Start     Ordered   01/10/23 1539  Hemoglobin A1c  Once,   R       Comments: To assess prior glycemic control    01/10/23 1540            Vitals/Pain Today's Vitals   01/10/23 1415 01/10/23 1445 01/10/23 1515 01/10/23 1533  BP: (!) 192/67 (!) 165/102 (!) 152/84   Pulse: (!) 59 82 (!) 57   Resp:   16   Temp:    98.5 F (36.9 C)  SpO2: 97% 97% 96%   PainSc:        Isolation Precautions No active isolations  Medications Medications  acetaminophen (TYLENOL) tablet 650 mg (has no administration in time range)  aspirin tablet 81 mg (has no administration in time range)  anastrozole (ARIMIDEX) tablet 1 mg (has no administration in time range)  amLODipine (NORVASC) tablet 10 mg (has no administration in time range)  carvedilol (COREG) tablet 25 mg (has no administration in time range)  simvastatin (ZOCOR) tablet 20 mg (has no administration in time range)  telmisartan-hydrochlorothiazide (MICARDIS HCT) 40-12.5 MG per tablet 1 tablet (has no administration in time range)  pantoprazole (PROTONIX) EC tablet 40 mg (has no administration in time range)  melatonin tablet 3 mg (has no administration in time range)  Vitamin D (Ergocalciferol) (DRISDOL) 1.25 MG (50000 UNIT) capsule 50,000 Units (has no administration in time range)  triamcinolone (NASACORT) nasal inhaler 2 spray (has no  administration in time range)  carboxymethylcellulose (REFRESH PLUS) 0.5 % ophthalmic solution 1 drop (has no administration in time range)  cycloSPORINE (RESTASIS) 0.05 % ophthalmic emulsion 1 drop (has no administration in time range)  HYDROcodone-acetaminophen (NORCO/VICODIN) 5-325 MG per tablet 1-2 tablet (has no administration in time range)  heparin injection 5,000 Units (has no administration in time range)  HYDROmorphone (DILAUDID) injection 0.5 mg (has no administration in time range)  senna-docusate (Senokot-S) tablet 1 tablet (has no administration in time range)  bisacodyl (DULCOLAX) EC tablet 5 mg (has no administration in time range)  insulin aspart (novoLOG) injection 0-9 Units (has no administration in time range)  oxyCODONE-acetaminophen (PERCOCET/ROXICET) 5-325 MG per tablet 1 tablet (1 tablet Oral Given 01/10/23 1355)  amLODipine (NORVASC) tablet 10 mg (10 mg Oral Given 01/10/23 1448)  carvedilol (COREG) tablet 25 mg (25 mg Oral Given 01/10/23 1446)    Mobility non-ambulatory     Focused Assessments Neuro Assessment Handoff:  Swallow screen pass? Yes          Neuro Assessment:   Neuro Checks:      Has TPA been given? No If patient is a Neuro Trauma and patient is going to OR before floor call report to 4N Charge nurse: [phone] or [phone]   R Recommendations: See Admitting Provider Note  Report given to:   Additional Notes:

## 2023-01-10 NOTE — ED Notes (Signed)
Pt back from hip block procedure at this time.  Pt transferred to hospital bed while off unit and remains on hospital bed for upcoming transfer to inpatient unit.

## 2023-01-10 NOTE — ED Notes (Signed)
Pt taken for hip block at this time.  Pt to return to ED 25 following block.

## 2023-01-10 NOTE — ED Notes (Signed)
Necklace given to daughter, [place]. Daughter still at bedside.

## 2023-01-11 ENCOUNTER — Inpatient Hospital Stay (HOSPITAL_COMMUNITY): Payer: Medicare Other

## 2023-01-11 DIAGNOSIS — I340 Nonrheumatic mitral (valve) insufficiency: Secondary | ICD-10-CM

## 2023-01-11 DIAGNOSIS — I361 Nonrheumatic tricuspid (valve) insufficiency: Secondary | ICD-10-CM | POA: Diagnosis not present

## 2023-01-11 DIAGNOSIS — S72001A Fracture of unspecified part of neck of right femur, initial encounter for closed fracture: Secondary | ICD-10-CM | POA: Diagnosis not present

## 2023-01-11 LAB — ECHOCARDIOGRAM COMPLETE
AR max vel: 2.29 cm2
AV Area VTI: 2.37 cm2
AV Area mean vel: 2.66 cm2
AV Mean grad: 3 mmHg
AV Peak grad: 7.7 mmHg
Ao pk vel: 1.39 m/s
Area-P 1/2: 1.82 cm2
MV VTI: 1.63 cm2
S' Lateral: 3.7 cm

## 2023-01-11 LAB — GLUCOSE, CAPILLARY
Glucose-Capillary: 222 mg/dL — ABNORMAL HIGH (ref 70–99)
Glucose-Capillary: 195 mg/dL — ABNORMAL HIGH (ref 70–99)
Glucose-Capillary: 132 mg/dL — ABNORMAL HIGH (ref 70–99)
Glucose-Capillary: 125 mg/dL — ABNORMAL HIGH (ref 70–99)

## 2023-01-11 MED ORDER — ENSURE ENLIVE PO LIQD
237.0000 mL | Freq: Two times a day (BID) | ORAL | Status: DC
  Administered 2023-01-14: 237 mL via ORAL

## 2023-01-11 MED ORDER — HYDRALAZINE HCL 20 MG/ML IJ SOLN
10.0000 mg | Freq: Four times a day (QID) | INTRAMUSCULAR | Status: DC | PRN
  Administered 2023-01-11 – 2023-01-14 (×3): 10 mg via INTRAVENOUS
  Filled 2023-01-11 (×3): qty 1

## 2023-01-11 MED ORDER — ADULT MULTIVITAMIN W/MINERALS CH
1.0000 | ORAL_TABLET | Freq: Every day | ORAL | Status: DC
  Administered 2023-01-13 – 2023-01-15 (×3): 1 via ORAL
  Filled 2023-01-11 (×3): qty 1

## 2023-01-11 MED ORDER — MELATONIN 5 MG PO TABS
5.0000 mg | ORAL_TABLET | Freq: Every evening | ORAL | Status: DC | PRN
  Administered 2023-01-14: 5 mg via ORAL

## 2023-01-11 NOTE — TOC Initial Note (Signed)
Transition of Care [facility]) - Initial/Assessment Note    Patient Details  Name: Laurie Fox MRN: [id number] Date of Birth: 09-16-1931  Transition of Care [facility]) CM/SW Contact:    Janae Bridgeman, RN Phone Number: 01/11/2023, 2:11 PM  Clinical Narrative:                 CM met with the patient and grandson, Kenard Gower at the bedside to discuss TOC needs.  The patient lives alone and fell - fracturing her hip.  Patient is waiting on surgery today for hip repair.  Patient has PIV and is currently npo pending surgery.  The patient plans to go to SNF after surgery and was updated regarding the process post surgery.  The patient's grandson states that patient lives alone and the plan is for the patient to go to SNF for rehab and family to assist with ALF, ILF after SNF.  Family has already started touring ALF facilities.  DME at the home includes [place] and [place].  PCP - Dr. Valentina Lucks - he is retired and patient plans to follow up with another physician in the group.  The patient's grandson was provided with Senior resource groups that can help the patient with this process.    Expected Discharge Plan: Skilled Nursing Facility Barriers to Discharge: Continued Medical Work up   Patient Goals and CMS Choice Patient states their goals for this hospitalization and ongoing recovery are:: To go to SNF for rehab after surgery CMS Medicare.gov Compare Post Acute Care list provided to:: Patient Choice offered to / list presented to : Patient [place] ownership interest in [facility].provided to:: Patient    Expected Discharge Plan and Services   Discharge Planning Services: CM Consult Post Acute Care Choice: Skilled Nursing Facility Living arrangements for the past 2 months: Single Family Home                                      Prior Living Arrangements/Services Living arrangements for the past 2 months: Single Family Home Lives with:: Self Patient language and need  for interpreter reviewed:: Yes Do you feel safe going back to the place where you live?: Yes      Need for Family Participation in Patient Care: Yes (Comment) Care giver support system in place?: Yes (comment) Current home services: DME Gilmer Mor, RW) Criminal Activity/Legal Involvement Pertinent to Current Situation/Hospitalization: No - Comment as needed  Activities of Daily Living      Permission Sought/Granted Permission sought to share information with : Case Manager, Family Supports, Oceanographer granted to share information with : Yes, Verbal Permission Granted     Permission granted to share info w AGENCY: SNF facility for rehab after surgery        Emotional Assessment Appearance:: Appears stated age Attitude/Demeanor/Rapport: Gracious Affect (typically observed): Accepting Orientation: : Oriented to Self, Oriented to Place, Oriented to  Time, Oriented to Situation Alcohol / Substance Use: Not Applicable Psych Involvement: No (comment)  Admission diagnosis:  Hip fracture (HCC) [S72.009A] Closed fracture of right hip, initial encounter [facility]) [S72.001A] Patient Active Problem List   Diagnosis Date Noted   Closed displaced fracture of right femoral neck (HCC) 01/10/2023   HTN (hypertension) 01/10/2023   Type 2 diabetes mellitus with chronic kidney disease, without long-term current use of insulin (HCC) 01/10/2023   Hip fracture (HCC) 01/10/2023   Breast cancer of upper-inner  quadrant of right female breast (HCC) 07/17/2013   PCP:  Kirby Funk, MD (Inactive) Pharmacy:   [id number] - [place], [place] - [address] [place] [address] Phone: [phone] Fax: [phone]  [facility]) - [place], [place] - [address] [facility] [address] [place] [address] [place] [address] Phone: [phone] Fax: [phone]  [facility] - [place], [place] - [address] [place] [address] Phone: [phone] Fax: [phone]     Social Determinants of Health (SDOH) Social History: SDOH Screenings   Tobacco Use: Unknown (01/10/2023)   SDOH Interventions:     Readmission Risk Interventions    01/11/2023    2:10 PM  Readmission Risk Prevention Plan  Transportation Screening Complete  PCP or Specialist Appt within 5-7 Days Complete  Home Care Screening Complete  Medication Review (RN CM) Complete

## 2023-01-11 NOTE — Progress Notes (Signed)
PROGRESS NOTE    Laurie Fox  ZOX:[id number] DOB: [birth year] DOA: 01/10/2023 PCP: Kirby Funk, MD (Inactive)   Brief Narrative:  HPI: Laurie Fox is a 89 y.o. female with medical history significant of breast cancer s/p lumpectomy and radiation therapy on chronic anastrozole, HTN, HLD, IIDM, multiple OA, presented with mechanical fall and right hip pain.   Patient was walking her dog on Sunday afternoon and dog suddenly dragged the patient, patient fell on her right hip.  Patient came to ED.  Sunday afternoon, trauma scan including right hip negative for fracture or dislocation and patient was given pain medication and sent home.  Over the weekend, patient started to have increasing pain associated with right hip ambulation and unable to put weight on right hip since yesterday because of excruciating pain.  And family brought her back to ED today.  At baseline patient lives alone in a two-story house able to climb 13 steps to upstairs with no shortness of breath or chest pain and she is able to walk her dog for 20-30 minutes without chest pain or shortness of breath.  No history of stroke or CAD, she only takes metformin for her diabetes   ED Course: Afebrile, borderline bradycardia blood pressure elevated nonhypoxic.  Trauma scan including right hip showing displaced right femoral neck fracture, no fracture found on ribs or elbow.  Blood work showed creatinine 1.4 compared to baseline 0.8 about 8 years ago, glucose 216, K4.0  Assessment & Plan:   Principal Problem:   Hip fracture (HCC) Active Problems:   Breast cancer of upper-inner quadrant of right female breast (HCC)   Closed displaced fracture of right femoral neck (HCC)   HTN (hypertension)   Type 2 diabetes mellitus with chronic kidney disease, without long-term current use of insulin (HCC)  Right femoral neck fracture Secondary to mechanical fall: Seen by orthopedics, scheduled for surgical repair today.   AKI vs CKD: No  recent kidney function baseline reading in the last 8 years.  Currently creatinine 1.4. -Clinically appears to be euvolemic.    HTN, uncontrolled: Blood pressure still significantly elevated.  Patient has not received a.m. medications since she is strictly NPO.  Will allow medications with sips.  Continue Coreg, HCTZ and ARB.  Increase dose of as needed IV hydralazine.   Type 2 diabetes mellitus: Takes metformin at home which is on hold.  Continue SSI, blood sugar fairly controlled.  Hyperlipidemia: Continue statin.   History of breast cancer stage I -S/p radiation and lumpectomy -On chronic anastrozole  DVT prophylaxis: heparin injection 5,000 Units Start: 01/10/23 1545   Code Status: Full Code  Family Communication:  None present at bedside.  Plan of care discussed with patient in length and he/she verbalized understanding and agreed with it.  Status is: Inpatient Remains inpatient appropriate because: Scheduled for surgery today.   Estimated body mass index is 31.75 kg/m as calculated from the following:   Height as of 12/09/17: 5' 3.5" (1.613 m).   Weight as of 12/09/17: 82.6 kg.    Nutritional Assessment: There is no height or weight on file to calculate BMI.. Seen by dietician.  I agree with the assessment and plan as outlined below: Nutrition Status:        . Skin Assessment: I have examined the patient's skin and I agree with the wound assessment as performed by the wound care RN as outlined below:    Consultants:  Orthopedics  Procedures:  As above  Antimicrobials:  Anti-infectives (  From admission, onward)    None         Subjective: Patient's and examined.  She is very pleasant.  She has no complaints.  No questions either.  Objective: Vitals:   01/10/23 2113 01/10/23 2213 01/11/23 0600 01/11/23 0755  BP:  (!) 160/63 (!) 198/83 (!) 219/55  Pulse:  75 (!) 59 62  Resp:  18 18 18   Temp: 97.6 F (36.4 C) 98.8 F (37.1 C) 98.2 F (36.8 C) 97.8 F  (36.6 C)  TempSrc: Oral Oral  Oral  SpO2:  92% 96% 97%    Intake/Output Summary (Last 24 hours) at 01/11/2023 0272 Last data filed at 01/11/2023 0756 Gross per 24 hour  Intake 0 ml  Output --  Net 0 ml   There were no vitals filed for this visit.  Examination:  General exam: Appears calm and comfortable  Respiratory system: Clear to auscultation. Respiratory effort normal. Cardiovascular system: S1 & S2 heard, RRR. No JVD, murmurs, rubs, gallops or clicks. No pedal edema. Gastrointestinal system: Abdomen is nondistended, soft and nontender. No organomegaly or masses felt. Normal bowel sounds heard. Central nervous system: Alert and oriented. No focal neurological deficits. Extremities: Right lower extremity shortened and externally rotated. Psychiatry: Judgement and insight appear normal. Mood & affect appropriate.    Data Reviewed: I have personally reviewed following labs and imaging studies  CBC: Recent Labs  Lab 01/10/23 1347  WBC 12.6*  NEUTROABS 10.9*  HGB 11.4*  HCT 36.1  MCV 85.1  PLT 299   Basic Metabolic Panel: Recent Labs  Lab 01/10/23 1347  NA 137  K 4.0  CL 99  CO2 26  GLUCOSE 216*  BUN 31*  CREATININE 1.40*  CALCIUM 9.8   GFR: CrCl cannot be calculated (Unknown ideal weight.). Liver Function Tests: No results for input(s): "AST", "ALT", "ALKPHOS", "BILITOT", "PROT", "ALBUMIN" in the last 168 hours. No results for input(s): "LIPASE", "AMYLASE" in the last 168 hours. No results for input(s): "AMMONIA" in the last 168 hours. Coagulation Profile: No results for input(s): "INR", "PROTIME" in the last 168 hours. Cardiac Enzymes: No results for input(s): "CKTOTAL", "CKMB", "CKMBINDEX", "TROPONINI" in the last 168 hours. BNP (last 3 results) No results for input(s): "PROBNP" in the last 8760 hours. HbA1C: Recent Labs    01/10/23 1539  HGBA1C 6.7*   CBG: Recent Labs  Lab 01/10/23 1636 01/10/23 2120 01/10/23 2311  GLUCAP 188* 174* 165*    Lipid Profile: No results for input(s): "CHOL", "HDL", "LDLCALC", "TRIG", "CHOLHDL", "LDLDIRECT" in the last 72 hours. Thyroid Function Tests: No results for input(s): "TSH", "T4TOTAL", "FREET4", "T3FREE", "THYROIDAB" in the last 72 hours. Anemia Panel: No results for input(s): "VITAMINB12", "FOLATE", "FERRITIN", "TIBC", "IRON", "RETICCTPCT" in the last 72 hours. Sepsis Labs: No results for input(s): "PROCALCITON", "LATICACIDVEN" in the last 168 hours.  No results found for this or any previous visit (from the past 240 hour(s)).   Radiology Studies: DG Ribs Unilateral W/Chest Right  Result Date: 01/10/2023 CLINICAL DATA:  Right rib pain after fall. EXAM: RIGHT RIBS AND CHEST - 3+ VIEW COMPARISON:  None Available. FINDINGS: No fracture or other bone lesions are seen involving the ribs. There is no evidence of pneumothorax or pleural effusion. Both lungs are clear. Heart size and mediastinal contours are within normal limits. IMPRESSION: Negative. Electronically Signed   By: Lupita Raider M.D.   On: 01/10/2023 15:13   DG Elbow Complete Right  Result Date: 01/10/2023 CLINICAL DATA:  Fall on cement stairs today.  Right elbow. EXAM: RIGHT ELBOW - COMPLETE 3+ VIEW COMPARISON:  Right elbow radiographs 01/06/2023 FINDINGS: There is diffuse decreased bone mineralization. Unchanged well corticated likely small loose body overlying the radiocapitellar joint. Mild to moderate medial elbow joint space narrowing and peripheral osteophytosis at the trochlea-coronoid process. Mild radiocapitellar joint space narrowing. Mild chronic enthesopathic change at the common extensor greater than common flexor tendon origins. Mild degenerative spurring at the radial tuberosity at the biceps tendon insertion is unchanged. Minimal mineralization of the triceps tendon insertion on the olecranon is unchanged from prior. There is again posterior elbow soft tissue swelling. No definite elbow joint effusion. IMPRESSION: 1. No  significant change from recent 01/06/2023 elbow radiographs. 2. Posterior elbow soft tissue swelling. Mild age indeterminate mineralization at the triceps insertion on the olecranon, an age indeterminate avulsion injury versus enthesopathic change as previously described. There is posterior elbow soft tissue sign which may be secondary to the patient's impact fall. 3. Mild-to-moderate medial elbow and mild radiocapitellar osteoarthritis. Electronically Signed   By: Neita Garnet M.D.   On: 01/10/2023 13:52   DG Hip Unilat  With Pelvis 2-3 Views Right  Result Date: 01/10/2023 CLINICAL DATA:  [place] sign stairs today.  Right hip pain. EXAM: DG HIP (WITH OR WITHOUT PELVIS) 2-3V RIGHT COMPARISON:  Pelvis and bilateral hip radiographs 01/06/2023 FINDINGS: There is diffuse decreased bone mineralization. There is an acute fracture of the subcapital proximal right femoral neck with approximately 9 mm superior displacement of the distal fracture, with respect to the proximal fracture component. Mild varus angulation. The fracture line is not well evaluated on lateral view due to overlying soft tissues. Mild bilateral femoroacetabular joint space narrowing. Mild bilateral sacroiliac subchondral sclerosis. Mild inferior pubic symphysis subchondral sclerosis. Vascular phleboliths overlie the pelvis. IMPRESSION: Acute fracture of the subcapital proximal right femoral neck with approximately 9 mm superior displacement and mild varus angulation. Electronically Signed   By: Neita Garnet M.D.   On: 01/10/2023 13:34   DG Hand Complete Right  Result Date: 01/10/2023 CLINICAL DATA:  Slipped and fell on cement stairs today. Pain to right hand. EXAM: RIGHT HAND - COMPLETE 3+ VIEW COMPARISON:  None Available. FINDINGS: There is diffuse decreased bone mineralization. Degenerative changes including joint space narrowing, subchondral sclerosis/cystic change, peripheral osteophytosis are severe at the thumb carpometacarpal joint and  index finger PIP and DIP joints; moderate to severe at the rest of the interphalangeal joints; moderate at the first through third metacarpophalangeal joints; and mild at the triscaphe joint. No acute fracture is seen.  No dislocation. IMPRESSION: 1. No acute fracture is seen. 2. Moderate to severe osteoarthritis, as above. Electronically Signed   By: Neita Garnet M.D.   On: 01/10/2023 13:32    Scheduled Meds:  aspirin  81 mg Oral Daily   carvedilol  25 mg Oral BID WC   heparin  5,000 Units Subcutaneous Q12H   irbesartan  150 mg Oral Daily   And   hydrochlorothiazide  12.5 mg Oral Daily   insulin aspart  0-9 Units Subcutaneous TID WC   melatonin  3 mg Oral QHS   pantoprazole  40 mg Oral Daily   simvastatin  20 mg Oral q1800   Continuous Infusions:   LOS: 1 day   Hughie Closs, MD [facility]  01/11/2023, 8:11 AM   *Please note that this is a verbal dictation therefore any spelling or grammatical errors are due to the "Dragon Medical One" system interpretation.  Please page via Amion and do  not message via secure chat for urgent patient care matters. Secure chat can be used for non urgent patient care matters.  How to contact the [facility] Attending or Consulting provider [place] or covering provider during after hours [place], for this patient?  Check the care team in [facility] and look for a) attending/consulting TRH provider listed and b) the [facility] [place] team listed. Page or secure chat [phone]. Log into [email or web address] and use [place]'s universal password to access. If you do not have the password, please contact the hospital operator. Locate the [facility] provider you are looking for under [facility] and page to a number that you can be directly reached. If you still have difficulty reaching the provider, please page the [facility] (Director on Call) for the Hospitalists listed on amion for assistance.

## 2023-01-11 NOTE — Progress Notes (Signed)
Initial Nutrition Assessment  DOCUMENTATION CODES:   Non-severe (moderate) malnutrition in context of social or environmental circumstances  INTERVENTION:  Once diet able to be advanced after surgery, recommend advancing to liberalized regular diet.  Encouraged adequate intake of calories and protein at meals with diet advancement.  Provide Ensure Enlive po BID, each supplement provides 350 kcal and 20 grams of protein.  Provide multivitamin with minerals po daily.  NUTRITION DIAGNOSIS:   Moderate Malnutrition related to social / environmental circumstances (inadequate oral intake, pt reports related to decreased appetite and lack of hunger) as evidenced by moderate fat depletion, moderate muscle depletion, severe muscle depletion.  GOAL:   Patient will meet greater than or equal to 90% of their needs  MONITOR:   PO intake, Supplement acceptance, Diet advancement, Labs, Weight trends, I & O's  REASON FOR ASSESSMENT:   Consult Assessment of nutrition requirement/status, Hip fracture protocol  ASSESSMENT:   89 year old female with PMHx of HTN, HLD, GERD, breast cancer s/p lumpectomy and XRT on chronic anastrozole, DM admitted after mechanical fall with right hip pain found to have right femoral neck fracture, also with AKI vs CKD.  Plan for surgical repair today in OR.  Met with pt at bedside. Pt reports she has had a poor appetite for the past few days related to pain. Pt was placed on heart healthy diet yesterday. She reports she was only able to eat bites of her meals. Made NPO at midnight for OR today. She reports at baseline she has had decreasing appetite and intake for a while now. She reports she has not felt as hungry at meals. Reports eating 2 meals per day. For breakfast she has biscuit with cheese and grits and coffee. For dinner she has soup. Denies food allergies or intolerances. Denies nausea, emesis, or abdominal pain. Denies any difficulty with chewing/swallowing.  Discussed increased nutrient needs for healing. Discussed importance of adequate intake of calories and protein once diet able to be advanced. Discussed foods high in protein pt can include at meals. Pt would also like to drink Ensure BID to help meet calorie/protein needs.  Pt reports her UBW was 200 lbs (90.9 kg) and she has slowly lost weight over time. Per review of chart pt was 91.9 kg in 2015. At last weight check on 12/09/2017 pt was 82.6 kg. Pt reports she is unsure what she weighs now but knows she has lost weight. No weight taken yet this admission. RD obtained bed scale weight of 67.7 kg (149.25 lbs) at time of RD assessment, but unsure if bed is zeroed correctly. Recommend continuing to monitor weight trends.  Medications reviewed and include: carvedilol, Novolog 0-9 units TID, pantoprazole  Labs reviewed: CBG 165-222  UOP: 1 occurrence unmeasured UOP documented so far today  I/O: + 10 mL since admission  NUTRITION - FOCUSED PHYSICAL EXAM:  Flowsheet Row Most Recent Value  Orbital Region Mild depletion  Upper Arm Region Moderate depletion  Thoracic and Lumbar Region Moderate depletion  Buccal Region Moderate depletion  Temple Region Moderate depletion  Clavicle Bone Region Moderate depletion  Clavicle and Acromion Bone Region Severe depletion  Scapular Bone Region Moderate depletion  Dorsal Hand Severe depletion  Patellar Region Moderate depletion  Anterior Thigh Region Moderate depletion  Posterior Calf Region Severe depletion  Edema (RD Assessment) Mild  [right lower extremity]  Hair Reviewed  Eyes Reviewed  Mouth Reviewed  Skin Reviewed  Nails Reviewed      Diet Order:   Diet  Order             Diet NPO time specified Except for: Sips with Meds  Diet effective midnight                  EDUCATION NEEDS:   Education needs have been addressed  Skin:  Skin Assessment: Reviewed RN Assessment  Last BM:  Unknown  Height:   Ht Readings from Last 1  Encounters:  12/09/17 5' 3.5" (1.613 m)   Weight:   Wt Readings from Last 1 Encounters:  01/11/23 67.7 kg   Ideal Body Weight:  53.4 kg  BMI:  Body mass index is 26.02 kg/m.  Estimated Nutritional Needs:   Kcal:  1700-1900  Protein:  85-95 grams  Fluid:  1.7-1.9 L/day  Letta Median, MS, RD, LDN, CNSC Pager number available on Amion

## 2023-01-11 NOTE — TOC CAGE-AID Note (Signed)
Transition of Care [facility]) - CAGE-AID Screening   Patient Details  Name: SABRA SESSLER MRN: [id number] Date of Birth: 1932-07-17  Transition of [facility]) CM/SW Contact:    Leota Sauers, RN Phone Number: 01/11/2023, 10:51 PM   Clinical Narrative:  Patient denies use of alcohol and illicit drugs. Education not offered at this time.  CAGE-AID Screening:    Have You Ever Felt You Ought to Cut Down on Your Drinking or Drug Use?: No Have People Annoyed You By Critizing Your Drinking Or Drug Use?: No Have You Felt Bad Or Guilty About Your Drinking Or Drug Use?: No Have You Ever Had a Drink or Used Drugs First Thing In The Morning to Steady Your Nerves or to Get Rid of a Hangover?: No CAGE-AID Score: 0  Substance Abuse Education Offered: No

## 2023-01-12 ENCOUNTER — Encounter (HOSPITAL_COMMUNITY): Payer: Self-pay | Admitting: Internal Medicine

## 2023-01-12 ENCOUNTER — Other Ambulatory Visit: Payer: Self-pay

## 2023-01-12 ENCOUNTER — Inpatient Hospital Stay (HOSPITAL_COMMUNITY): Payer: Medicare Other

## 2023-01-12 ENCOUNTER — Encounter (HOSPITAL_COMMUNITY)
Admission: EM | Disposition: A | Discharge status: Skilled Nursing Facility | Payer: Self-pay | Source: Home / Self Care | Attending: Family Medicine

## 2023-01-12 ENCOUNTER — Inpatient Hospital Stay (HOSPITAL_COMMUNITY): Payer: Medicare Other | Admitting: Anesthesiology

## 2023-01-12 DIAGNOSIS — S72001A Fracture of unspecified part of neck of right femur, initial encounter for closed fracture: Secondary | ICD-10-CM | POA: Diagnosis not present

## 2023-01-12 DIAGNOSIS — I10 Essential (primary) hypertension: Secondary | ICD-10-CM

## 2023-01-12 DIAGNOSIS — E119 Type 2 diabetes mellitus without complications: Secondary | ICD-10-CM

## 2023-01-12 DIAGNOSIS — N289 Disorder of kidney and ureter, unspecified: Secondary | ICD-10-CM

## 2023-01-12 DIAGNOSIS — E440 Moderate protein-calorie malnutrition: Secondary | ICD-10-CM | POA: Insufficient documentation

## 2023-01-12 HISTORY — PX: TOTAL HIP ARTHROPLASTY: SHX124

## 2023-01-12 LAB — CBC WITH DIFFERENTIAL/PLATELET
Abs Immature Granulocytes: 0.1 10*3/uL — ABNORMAL HIGH (ref 0.00–0.07)
Basophils Absolute: 0 10*3/uL (ref 0.0–0.1)
Basophils Relative: 0 %
Eosinophils Absolute: 0.1 10*3/uL (ref 0.0–0.5)
Eosinophils Relative: 1 %
HCT: 31.9 % — ABNORMAL LOW (ref 36.0–46.0)
Hemoglobin: 10.1 g/dL — ABNORMAL LOW (ref 12.0–15.0)
Immature Granulocytes: 1 %
Lymphocytes Relative: 12 %
Lymphs Abs: 1.1 10*3/uL (ref 0.7–4.0)
MCH: 25.8 pg — ABNORMAL LOW (ref 26.0–34.0)
MCHC: 31.7 g/dL (ref 30.0–36.0)
MCV: 81.4 fL (ref 80.0–100.0)
Monocytes Absolute: 0.7 10*3/uL (ref 0.1–1.0)
Monocytes Relative: 8 %
Neutro Abs: 7 10*3/uL (ref 1.7–7.7)
Neutrophils Relative %: 78 %
Platelets: 358 10*3/uL (ref 150–400)
RBC: 3.92 MIL/uL (ref 3.87–5.11)
RDW: 19.9 % — ABNORMAL HIGH (ref 11.5–15.5)
WBC: 9 10*3/uL (ref 4.0–10.5)
nRBC: 0 % (ref 0.0–0.2)

## 2023-01-12 LAB — TYPE AND SCREEN
ABO/RH(D): A POS
Antibody Screen: NEGATIVE

## 2023-01-12 LAB — BASIC METABOLIC PANEL
Anion gap: 10 (ref 5–15)
BUN: 34 mg/dL — ABNORMAL HIGH (ref 8–23)
CO2: 29 mmol/L (ref 22–32)
Calcium: 9.3 mg/dL (ref 8.9–10.3)
Chloride: 99 mmol/L (ref 98–111)
Creatinine, Ser: 1.1 mg/dL — ABNORMAL HIGH (ref 0.44–1.00)
GFR, Estimated: 48 mL/min — ABNORMAL LOW (ref >60)
Glucose, Bld: 222 mg/dL — ABNORMAL HIGH (ref 70–99)
Potassium: 3.5 mmol/L (ref 3.5–5.1)
Sodium: 138 mmol/L (ref 135–145)

## 2023-01-12 LAB — GLUCOSE, CAPILLARY
Glucose-Capillary: 170 mg/dL — ABNORMAL HIGH (ref 70–99)
Glucose-Capillary: 187 mg/dL — ABNORMAL HIGH (ref 70–99)
Glucose-Capillary: 173 mg/dL — ABNORMAL HIGH (ref 70–99)
Glucose-Capillary: 220 mg/dL — ABNORMAL HIGH (ref 70–99)
Glucose-Capillary: 237 mg/dL — ABNORMAL HIGH (ref 70–99)

## 2023-01-12 LAB — ABO/RH: ABO/RH(D): A POS

## 2023-01-12 LAB — SURGICAL PCR SCREEN
MRSA, PCR: NEGATIVE
Staphylococcus aureus: NEGATIVE

## 2023-01-12 SURGERY — ARTHROPLASTY, HIP, TOTAL, ANTERIOR APPROACH
Anesthesia: General | Site: Hip | Laterality: Right

## 2023-01-12 MED ORDER — ONDANSETRON HCL 4 MG PO TABS: 4.0000 mg | ORAL_TABLET | Freq: Four times a day (QID) | ORAL | Status: DC | PRN

## 2023-01-12 MED ORDER — PROPOFOL 10 MG/ML IV BOLUS
INTRAVENOUS | Status: AC
  Filled 2023-01-12: qty 20

## 2023-01-12 MED ORDER — LACTATED RINGERS IV SOLN: INTRAVENOUS | Status: DC

## 2023-01-12 MED ORDER — CHLORHEXIDINE GLUCONATE 0.12 % MT SOLN
15.0000 mL | Freq: Once | OROMUCOSAL | Status: AC
  Administered 2023-01-12: 15 mL via OROMUCOSAL

## 2023-01-12 MED ORDER — ROCURONIUM BROMIDE 10 MG/ML (PF) SYRINGE
PREFILLED_SYRINGE | INTRAVENOUS | Status: DC | PRN
  Administered 2023-01-12: 60 mg via INTRAVENOUS
  Administered 2023-01-12: 20 mg via INTRAVENOUS

## 2023-01-12 MED ORDER — ACETAMINOPHEN 10 MG/ML IV SOLN
INTRAVENOUS | Status: AC
  Filled 2023-01-12: qty 100

## 2023-01-12 MED ORDER — CEFAZOLIN SODIUM-DEXTROSE 2-3 GM-%(50ML) IV SOLR
INTRAVENOUS | Status: DC | PRN
  Administered 2023-01-12: 2 g via INTRAVENOUS

## 2023-01-12 MED ORDER — ONDANSETRON HCL 4 MG/2ML IJ SOLN
INTRAMUSCULAR | Status: AC
  Filled 2023-01-12: qty 2

## 2023-01-12 MED ORDER — LIDOCAINE 2% (20 MG/ML) 5 ML SYRINGE
INTRAMUSCULAR | Status: DC | PRN
  Administered 2023-01-12: 40 mg via INTRAVENOUS

## 2023-01-12 MED ORDER — ROCURONIUM BROMIDE 10 MG/ML (PF) SYRINGE
PREFILLED_SYRINGE | INTRAVENOUS | Status: AC
  Filled 2023-01-12: qty 10

## 2023-01-12 MED ORDER — TRANEXAMIC ACID-NACL 1000-0.7 MG/100ML-% IV SOLN
INTRAVENOUS | Status: AC
  Filled 2023-01-12: qty 100

## 2023-01-12 MED ORDER — MENTHOL 3 MG MT LOZG: 1.0000 | LOZENGE | OROMUCOSAL | Status: DC | PRN

## 2023-01-12 MED ORDER — FENTANYL CITRATE (PF) 100 MCG/2ML IJ SOLN
25.0000 ug | INTRAMUSCULAR | Status: DC | PRN
  Administered 2023-01-12 (×2): 25 ug via INTRAVENOUS

## 2023-01-12 MED ORDER — CEFAZOLIN SODIUM-DEXTROSE 2-4 GM/100ML-% IV SOLN
INTRAVENOUS | Status: AC
  Filled 2023-01-12: qty 100

## 2023-01-12 MED ORDER — ASPIRIN 81 MG PO CHEW
81.0000 mg | CHEWABLE_TABLET | Freq: Two times a day (BID) | ORAL | Status: DC
  Administered 2023-01-12 – 2023-01-15 (×6): 81 mg via ORAL
  Filled 2023-01-12 (×6): qty 1

## 2023-01-12 MED ORDER — CEFAZOLIN SODIUM-DEXTROSE 2-4 GM/100ML-% IV SOLN
2.0000 g | Freq: Three times a day (TID) | INTRAVENOUS | Status: AC
  Administered 2023-01-12 (×2): 2 g via INTRAVENOUS
  Filled 2023-01-12 (×2): qty 100

## 2023-01-12 MED ORDER — BUPIVACAINE-EPINEPHRINE (PF) 0.5% -1:200000 IJ SOLN
INTRAMUSCULAR | Status: DC | PRN
  Administered 2023-01-12: 30 mL

## 2023-01-12 MED ORDER — ONDANSETRON HCL 4 MG/2ML IJ SOLN
INTRAMUSCULAR | Status: DC | PRN
  Administered 2023-01-12: 4 mg via INTRAVENOUS

## 2023-01-12 MED ORDER — INSULIN ASPART 100 UNIT/ML IJ SOLN: 0.0000 [IU] | INTRAMUSCULAR | Status: DC | PRN

## 2023-01-12 MED ORDER — EPHEDRINE SULFATE-NACL 50-0.9 MG/10ML-% IV SOSY
PREFILLED_SYRINGE | INTRAVENOUS | Status: DC | PRN
  Administered 2023-01-12: 5 mg via INTRAVENOUS

## 2023-01-12 MED ORDER — SODIUM CHLORIDE (PF) 0.9 % IJ SOLN
INTRAMUSCULAR | Status: DC | PRN
  Administered 2023-01-12: 30 mL via INTRAVENOUS

## 2023-01-12 MED ORDER — LIDOCAINE 2% (20 MG/ML) 5 ML SYRINGE
INTRAMUSCULAR | Status: AC
  Filled 2023-01-12: qty 5

## 2023-01-12 MED ORDER — POLYETHYLENE GLYCOL 3350 17 G PO PACK: 17.0000 g | PACK | Freq: Every day | ORAL | Status: DC | PRN

## 2023-01-12 MED ORDER — KETOROLAC TROMETHAMINE 30 MG/ML IJ SOLN
INTRAMUSCULAR | Status: DC | PRN
  Administered 2023-01-12: 30 mg via INTRAMUSCULAR

## 2023-01-12 MED ORDER — SODIUM CHLORIDE 0.9 % IV SOLN
INTRAVENOUS | Status: AC | PRN
  Administered 2023-01-12: 500 mL

## 2023-01-12 MED ORDER — SODIUM CHLORIDE 0.9 % IV SOLN: INTRAVENOUS | Status: DC

## 2023-01-12 MED ORDER — DEXAMETHASONE SODIUM PHOSPHATE 10 MG/ML IJ SOLN
INTRAMUSCULAR | Status: AC
  Filled 2023-01-12: qty 1

## 2023-01-12 MED ORDER — DOCUSATE SODIUM 100 MG PO CAPS
100.0000 mg | ORAL_CAPSULE | Freq: Two times a day (BID) | ORAL | Status: DC
  Administered 2023-01-12 – 2023-01-15 (×6): 100 mg via ORAL
  Filled 2023-01-12 (×6): qty 1

## 2023-01-12 MED ORDER — FENTANYL CITRATE (PF) 100 MCG/2ML IJ SOLN
INTRAMUSCULAR | Status: AC
  Filled 2023-01-12: qty 2

## 2023-01-12 MED ORDER — TRANEXAMIC ACID-NACL 1000-0.7 MG/100ML-% IV SOLN
INTRAVENOUS | Status: DC | PRN
  Administered 2023-01-12: 1000 mg via INTRAVENOUS

## 2023-01-12 MED ORDER — DIPHENHYDRAMINE HCL 12.5 MG/5ML PO ELIX: 12.5000 mg | ORAL_SOLUTION | ORAL | Status: DC | PRN

## 2023-01-12 MED ORDER — CHLORHEXIDINE GLUCONATE 0.12 % MT SOLN
OROMUCOSAL | Status: AC
  Filled 2023-01-12: qty 15

## 2023-01-12 MED ORDER — ACETAMINOPHEN 10 MG/ML IV SOLN
INTRAVENOUS | Status: DC | PRN
  Administered 2023-01-12: 1000 mg via INTRAVENOUS

## 2023-01-12 MED ORDER — METHOCARBAMOL 500 MG PO TABS
500.0000 mg | ORAL_TABLET | Freq: Four times a day (QID) | ORAL | Status: DC | PRN
  Administered 2023-01-12 – 2023-01-13 (×3): 500 mg via ORAL
  Filled 2023-01-12 (×3): qty 1

## 2023-01-12 MED ORDER — ALBUMIN HUMAN 5 % IV SOLN: INTRAVENOUS | Status: DC | PRN

## 2023-01-12 MED ORDER — SENNA 8.6 MG PO TABS
1.0000 | ORAL_TABLET | Freq: Two times a day (BID) | ORAL | Status: DC
  Administered 2023-01-12 – 2023-01-15 (×6): 8.6 mg via ORAL
  Filled 2023-01-12 (×6): qty 1

## 2023-01-12 MED ORDER — SUGAMMADEX SODIUM 200 MG/2ML IV SOLN
INTRAVENOUS | Status: DC | PRN
  Administered 2023-01-12: 200 mg via INTRAVENOUS

## 2023-01-12 MED ORDER — ORAL CARE MOUTH RINSE: 15.0000 mL | Freq: Once | OROMUCOSAL | Status: AC

## 2023-01-12 MED ORDER — ONDANSETRON HCL 4 MG/2ML IJ SOLN: 4.0000 mg | Freq: Four times a day (QID) | INTRAMUSCULAR | Status: DC | PRN

## 2023-01-12 MED ORDER — BUPIVACAINE-EPINEPHRINE (PF) 0.5% -1:200000 IJ SOLN
INTRAMUSCULAR | Status: AC
  Filled 2023-01-12: qty 30

## 2023-01-12 MED ORDER — SODIUM CHLORIDE 0.9 % IR SOLN
Status: DC | PRN
  Administered 2023-01-12: 3000 mL

## 2023-01-12 MED ORDER — FENTANYL CITRATE (PF) 250 MCG/5ML IJ SOLN
INTRAMUSCULAR | Status: DC | PRN
  Administered 2023-01-12 (×3): 50 ug via INTRAVENOUS
  Administered 2023-01-12 (×2): 25 ug via INTRAVENOUS

## 2023-01-12 MED ORDER — PROPOFOL 10 MG/ML IV BOLUS
INTRAVENOUS | Status: DC | PRN
  Administered 2023-01-12: 20 mg via INTRAVENOUS
  Administered 2023-01-12: 100 mg via INTRAVENOUS

## 2023-01-12 MED ORDER — 0.9 % SODIUM CHLORIDE (POUR BTL) OPTIME
TOPICAL | Status: DC | PRN
  Administered 2023-01-12: 1000 mL

## 2023-01-12 MED ORDER — METOCLOPRAMIDE HCL 5 MG/ML IJ SOLN: 5.0000 mg | Freq: Three times a day (TID) | INTRAMUSCULAR | Status: DC | PRN

## 2023-01-12 MED ORDER — EPHEDRINE 5 MG/ML INJ
INTRAVENOUS | Status: AC
  Filled 2023-01-12: qty 5

## 2023-01-12 MED ORDER — VANCOMYCIN HCL 1000 MG IV SOLR
INTRAVENOUS | Status: DC | PRN
  Administered 2023-01-12: 1000 mg via TOPICAL

## 2023-01-12 MED ORDER — METHOCARBAMOL 1000 MG/10ML IJ SOLN: 500.0000 mg | Freq: Four times a day (QID) | INTRAVENOUS | Status: DC | PRN

## 2023-01-12 MED ORDER — PHENOL 1.4 % MT LIQD: 1.0000 | OROMUCOSAL | Status: DC | PRN

## 2023-01-12 MED ORDER — FENTANYL CITRATE (PF) 250 MCG/5ML IJ SOLN
INTRAMUSCULAR | Status: AC
  Filled 2023-01-12: qty 5

## 2023-01-12 MED ORDER — METOCLOPRAMIDE HCL 5 MG PO TABS: 5.0000 mg | ORAL_TABLET | Freq: Three times a day (TID) | ORAL | Status: DC | PRN

## 2023-01-12 MED ORDER — VANCOMYCIN HCL 1000 MG IV SOLR
INTRAVENOUS | Status: AC
  Filled 2023-01-12: qty 20

## 2023-01-12 MED ORDER — KETOROLAC TROMETHAMINE 30 MG/ML IJ SOLN
INTRAMUSCULAR | Status: AC
  Filled 2023-01-12: qty 1

## 2023-01-12 MED ORDER — ALUM & MAG HYDROXIDE-SIMETH 200-200-20 MG/5ML PO SUSP: 30.0000 mL | ORAL | Status: DC | PRN

## 2023-01-12 SURGICAL SUPPLY — 69 items
ACE SHELL 3H 52 E HIP (Shell) ×1 IMPLANT
ADH SKN CLS APL DERMABOND .7 (GAUZE/BANDAGES/DRESSINGS) ×2
ALCOHOL 70% 16 OZ (MISCELLANEOUS) ×1 IMPLANT
APL PRP STRL LF DISP 70% ISPRP (MISCELLANEOUS) ×1
BAG COUNTER SPONGE SURGICOUNT (BAG) ×1 IMPLANT
BAG SPNG CNTER NS LX DISP (BAG) ×1
BLADE CLIPPER SURG (BLADE) IMPLANT
CHLORAPREP W/TINT 26 (MISCELLANEOUS) ×1 IMPLANT
COVER SURGICAL LIGHT HANDLE (MISCELLANEOUS) ×1 IMPLANT
DERMABOND ADVANCED .7 DNX12 (GAUZE/BANDAGES/DRESSINGS) ×2 IMPLANT
DRAPE C-ARM 42X72 X-RAY (DRAPES) ×1 IMPLANT
DRAPE STERI IOBAN 125X83 (DRAPES) ×1 IMPLANT
DRAPE U-SHAPE 47X51 STRL (DRAPES) ×3 IMPLANT
DRSG AQUACEL AG ADV 3.5X10 (GAUZE/BANDAGES/DRESSINGS) ×1 IMPLANT
ELECT BLADE 4.0 EZ CLEAN MEGAD (MISCELLANEOUS) ×1
ELECT PENCIL ROCKER SW 15FT (MISCELLANEOUS) ×1 IMPLANT
ELECT REM PT RETURN 9FT ADLT (ELECTROSURGICAL) ×1
ELECTRODE BLDE 4.0 EZ CLN MEGD (MISCELLANEOUS) ×1 IMPLANT
ELECTRODE REM PT RTRN 9FT ADLT (ELECTROSURGICAL) ×1 IMPLANT
EVACUATOR 1/8 PVC DRAIN (DRAIN) IMPLANT
GLOVE BIO SURGEON STRL SZ8.5 (GLOVE) ×2 IMPLANT
GLOVE BIOGEL M 7.0 STRL (GLOVE) ×1 IMPLANT
GLOVE BIOGEL PI IND STRL 7.0 (GLOVE) IMPLANT
GLOVE BIOGEL PI IND STRL 7.5 (GLOVE) ×1 IMPLANT
GLOVE BIOGEL PI IND STRL 8.5 (GLOVE) ×1 IMPLANT
GOWN STRL REUS W/ TWL LRG LVL3 (GOWN DISPOSABLE) ×2 IMPLANT
GOWN STRL REUS W/ TWL XL LVL3 (GOWN DISPOSABLE) ×1 IMPLANT
GOWN STRL REUS W/TWL 2XL LVL3 (GOWN DISPOSABLE) ×1 IMPLANT
GOWN STRL REUS W/TWL LRG LVL3 (GOWN DISPOSABLE) ×2
GOWN STRL REUS W/TWL XL LVL3 (GOWN DISPOSABLE) ×1
HANDPIECE INTERPULSE COAX TIP (DISPOSABLE) ×1
HEAD FEM -3XOFST 36XMDLR (Head) IMPLANT
HEAD MODULAR 36MM (Head) ×1 IMPLANT
HOOD W/PEELAWAY (MISCELLANEOUS) ×2 IMPLANT
JET LAVAGE IRRISEPT WOUND (IRRIGATION / IRRIGATOR) ×1
KIT BASIN OR (CUSTOM PROCEDURE TRAY) ×1 IMPLANT
KIT TURNOVER KIT B (KITS) ×1 IMPLANT
LAVAGE JET IRRISEPT WOUND (IRRIGATION / IRRIGATOR) ×1 IMPLANT
LINER ACE G7 HIGH 36 SZ E (Liner) IMPLANT
MANIFOLD NEPTUNE II (INSTRUMENTS) ×1 IMPLANT
MARKER SKIN DUAL TIP RULER LAB (MISCELLANEOUS) ×2 IMPLANT
NDL SPNL 18GX3.5 QUINCKE PK (NEEDLE) ×1 IMPLANT
NEEDLE SPNL 18GX3.5 QUINCKE PK (NEEDLE) ×1 IMPLANT
NS IRRIG 1000ML POUR BTL (IV SOLUTION) ×1 IMPLANT
PACK TOTAL JOINT (CUSTOM PROCEDURE TRAY) ×1 IMPLANT
PACK UNIVERSAL I (CUSTOM PROCEDURE TRAY) ×1 IMPLANT
PAD ARMBOARD 7.5X6 YLW CONV (MISCELLANEOUS) ×2 IMPLANT
SAW OSC TIP CART 19.5X105X1.3 (SAW) ×1 IMPLANT
SEALER BIPOLAR AQUA 6.0 (INSTRUMENTS) IMPLANT
SET HNDPC FAN SPRY TIP SCT (DISPOSABLE) ×1 IMPLANT
SHELL ACETAB 3H 52 E HIP (Shell) IMPLANT
SOL PREP POV-IOD 4OZ 10% (MISCELLANEOUS) ×1 IMPLANT
STAPLER VISISTAT 35W (STAPLE) IMPLANT
STEM FEM CMTLS HO 12 144 (Stem) IMPLANT
SUT ETHIBOND NAB CT1 #1 30IN (SUTURE) IMPLANT
SUT MNCRL AB 3-0 PS2 18 (SUTURE) IMPLANT
SUT MON AB 2-0 CT1 36 (SUTURE) ×1 IMPLANT
SUT VIC AB 0 CT1 36 (SUTURE) IMPLANT
SUT VIC AB 2-0 CT1 27 (SUTURE) ×1
SUT VIC AB 2-0 CT1 TAPERPNT 27 (SUTURE) ×1 IMPLANT
SUT VLOC 180 0 24IN GS25 (SUTURE) ×1 IMPLANT
SYR 50ML LL SCALE MARK (SYRINGE) ×1 IMPLANT
TOWEL GREEN STERILE (TOWEL DISPOSABLE) ×1 IMPLANT
TOWEL GREEN STERILE FF (TOWEL DISPOSABLE) ×1 IMPLANT
TRAY CATH INTERMITTENT SS 16FR (CATHETERS) IMPLANT
TRAY FOLEY W/BAG SLVR 16FR (SET/KITS/TRAYS/PACK)
TRAY FOLEY W/BAG SLVR 16FR ST (SET/KITS/TRAYS/PACK) IMPLANT
TUBE SUCT ARGYLE STRL (TUBING) ×1 IMPLANT
WATER STERILE IRR 1000ML POUR (IV SOLUTION) ×3 IMPLANT

## 2023-01-12 NOTE — Op Note (Addendum)
OPERATIVE REPORT  SURGEON: Samson Frederic, MD   ASSISTANT: Arther Abbott, PA-C.  PREOPERATIVE DIAGNOSIS: Displaced Right femoral neck fracture.   POSTOPERATIVE DIAGNOSIS: Displaced Right femoral neck fracture.   PROCEDURE: Right total hip arthroplasty, anterior approach.   IMPLANTS: Biomet Taperloc Reduced Distal stem, size 12x125mm, high offset. Biomet G7 OsseoTi Cup, size 52 mm. Biomet Vivacit-E liner, size 36 mm, E, neutral. Biomet metal head ball, size 36 - 3 mm.  ANESTHESIA:  MAC and Spinal  ANTIBIOTICS: 2g ancef.  ESTIMATED BLOOD LOSS:-200 mL    DRAINS: None.  COMPLICATIONS: None   CONDITION: PACU - hemodynamically stable.   BRIEF CLINICAL NOTE: Laurie Fox is a 89 y.o. female with a displaced Right femoral neck fracture. The patient was admitted to the hospitalist service and underwent perioperative risk stratification and medical optimization. The risks, benefits, and alternatives to total hip arthroplasty were explained, and the patient elected to proceed.  PROCEDURE IN DETAIL: The patient was taken to the operating room and general anesthesia was induced on the hospital bed.  The patient was then positioned on the Hana table.  All bony prominences were well padded.  The hip was prepped and draped in the normal sterile surgical fashion.  A time-out was called verifying side and site of surgery. Antibiotics were given within 60 minutes of beginning the procedure.   Bikini incision was made, and the direct anterior approach to the hip was performed through the Hueter interval.  Lateral femoral circumflex vessels were treated with the Auqumantys. The anterior capsule was exposed and an inverted T capsulotomy was made.  Fracture hematoma was encountered and evacuated. The patient was found to have a comminuted Right subcapital femoral neck fracture.  I freshened the femoral neck cut with a saw.  I removed the femoral neck fragment.  A corkscrew was placed into the head  and the head was removed.  This was passed to the back table and was measured. The pubofemoral ligament was released subperiosteally to the lesser trochanter.  Acetabular exposure was achieved, and the pulvinar and labrum were excised. Sequential reaming of the acetabulum was then performed up to a size 51 mm reamer under direct visulization. A 52 mm cup was then opened and impacted into place at approximately 40 degrees of abduction and 20 degrees of anteversion. The final polyethylene liner was impacted into place and acetabular osteophytes were removed.    I then gained femoral exposure taking care to protect the abductors and greater trochanter.  This was performed using standard external rotation, extension, and adduction.  A cookie cutter was used to enter the femoral canal, and then the femoral canal finder was placed.  Sequential broaching was performed up to a size 12.  Calcar planer was used on the femoral neck remnant.  I placed a high offset neck and a trial head ball.  The hip was reduced.  Leg lengths and offset were checked fluoroscopically.  The hip was dislocated and trial components were removed.  The final implants were placed, and the hip was reduced.  Fluoroscopy was used to confirm component position and leg lengths.  At 90 degrees of external rotation and full extension, the hip was stable to an anterior directed force.   The wound was copiously irrigated with Irrisept solution and normal saline using pule lavage.  Marcaine solution was injected into the periarticular soft tissue.  The wound was closed in layers using #1 Stratafix for the fascia, 2-0 Vicryl for the subcutaneous fat, 2-0 Monocryl for  the deep dermal layer, and staples + Dermabond for the skin.  Once the glue was fully dried, an Aquacell Ag dressing was applied.  The patient was transported to the recovery room in stable condition.  Sponge, needle, and instrument counts were correct at the end of the case x2.  The patient  tolerated the procedure well and there were no known complications.  The aquamantis was utilized for this case to help facilitate better hemostasis as patient was felt to be at increased risk of bleeding because of preop anemia.  A oscillating saw tip was utilized for this case to prevent damage to the soft tissue structures such as muscles, ligaments and tendons, and to ensure accurate bone cuts. This patient was at increased risk for above structures due to  minimally invasive approach.  Please note that a surgical assistant was a medical necessity for this procedure to perform it in a safe and expeditious manner. Assistant was necessary to provide appropriate retraction of vital neurovascular structures, to prevent femoral fracture, and to allow for anatomic placement of the prosthesis.

## 2023-01-12 NOTE — Plan of Care (Addendum)
Patient AOX4, forgetful, VSS throughout shift with elevated BP.  Pt c/o generalized pressure pain.  PRN hydralazine given for pain and dilaudid given for pain.  BP resolved but became soft, pt remained asymptomatic.  All meds given on time as ordered.  Diminished lungs, IS encouraged.  Purewick in place.  CHG bath given. Pt remains NPO since 12am.  POC maintained, will continue to monitor.  Problem: Education: Goal: Ability to describe self-care measures that may prevent or decrease complications (Diabetes Survival Skills Education) will improve Outcome: Progressing Goal: Individualized Educational Video(s) Outcome: Progressing   Problem: Coping: Goal: Ability to adjust to condition or change in health will improve Outcome: Progressing   Problem: Fluid Volume: Goal: Ability to maintain a balanced intake and output will improve Outcome: Progressing   Problem: Health Behavior/Discharge Planning: Goal: Ability to identify and utilize available resources and services will improve Outcome: Progressing Goal: Ability to manage health-related needs will improve Outcome: Progressing   Problem: Metabolic: Goal: Ability to maintain appropriate glucose levels will improve Outcome: Progressing   Problem: Nutritional: Goal: Maintenance of adequate nutrition will improve Outcome: Progressing Goal: Progress toward achieving an optimal weight will improve Outcome: Progressing   Problem: Skin Integrity: Goal: Risk for impaired skin integrity will decrease Outcome: Progressing   Problem: Tissue Perfusion: Goal: Adequacy of tissue perfusion will improve Outcome: Progressing   Problem: Education: Goal: Knowledge of General Education information will improve Description: Including pain rating scale, medication(s)/side effects and non-pharmacologic comfort measures Outcome: Progressing   Problem: Health Behavior/Discharge Planning: Goal: Ability to manage health-related needs will  improve Outcome: Progressing   Problem: Clinical Measurements: Goal: Ability to maintain clinical measurements within normal limits will improve Outcome: Progressing Goal: Will remain free from infection Outcome: Progressing Goal: Diagnostic test results will improve Outcome: Progressing Goal: Respiratory complications will improve Outcome: Progressing Goal: Cardiovascular complication will be avoided Outcome: Progressing   Problem: Activity: Goal: Risk for activity intolerance will decrease Outcome: Progressing   Problem: Nutrition: Goal: Adequate nutrition will be maintained Outcome: Progressing   Problem: Coping: Goal: Level of anxiety will decrease Outcome: Progressing   Problem: Elimination: Goal: Will not experience complications related to bowel motility Outcome: Progressing Goal: Will not experience complications related to urinary retention Outcome: Progressing   Problem: Pain Managment: Goal: General experience of comfort will improve Outcome: Progressing   Problem: Safety: Goal: Ability to remain free from injury will improve Outcome: Progressing   Problem: Skin Integrity: Goal: Risk for impaired skin integrity will decrease Outcome: Progressing

## 2023-01-12 NOTE — Anesthesia Postprocedure Evaluation (Addendum)
Anesthesia Post Note  Patient: Laurie Fox  Procedure(s) Performed: TOTAL HIP ARTHROPLASTY ANTERIOR APPROACH (Right: Hip)     Patient location during evaluation: PACU Anesthesia Type: General Level of consciousness: awake and alert Pain management: pain level controlled Vital Signs Assessment: post-procedure vital signs reviewed and stable Respiratory status: spontaneous breathing, nonlabored ventilation, respiratory function stable and patient connected to nasal cannula oxygen Cardiovascular status: blood pressure returned to baseline and stable Postop Assessment: no apparent nausea or vomiting Anesthetic complications: no  No notable events documented.  Last Vitals:  Vitals:   01/12/23 1045 01/12/23 1150  BP: (!) 190/50 (!) 193/48  Pulse: 77 67  Resp: (!) 25 17  Temp: 36.6 C (!) 36.4 C  SpO2: 99% 99%                 Shelton Silvas

## 2023-01-12 NOTE — Anesthesia Procedure Notes (Signed)
Procedure Name: Intubation Date/Time: 01/12/2023 8:00 AM  Performed by: Quentin Ore, CRNAPre-anesthesia Checklist: Patient identified, Emergency Drugs available, Suction available and Patient being monitored Patient Re-evaluated:Patient Re-evaluated prior to induction Oxygen Delivery Method: Circle system utilized Preoxygenation: Pre-oxygenation with 100% oxygen Induction Type: IV induction Ventilation: Mask ventilation without difficulty Laryngoscope Size: Mac and 3 Grade View: Grade II Tube type: Oral Tube size: 7.0 mm Number of attempts: 1 Airway Equipment and Method: Stylet Placement Confirmation: ETT inserted through vocal cords under direct vision, positive ETCO2 and breath sounds checked- equal and bilateral Secured at: 21 cm Tube secured with: Tape Dental Injury: Teeth and Oropharynx as per pre-operative assessment

## 2023-01-12 NOTE — Anesthesia Preprocedure Evaluation (Addendum)
Anesthesia Evaluation  Patient identified by MRN, date of birth, ID band Patient awake    Reviewed: Allergy & Precautions, NPO status , Patient's Chart, lab work & pertinent test results  Airway Mallampati: II  TM Distance: >3 FB Neck ROM: Full    Dental  (+) Chipped,    Pulmonary neg pulmonary ROS   breath sounds clear to auscultation       Cardiovascular hypertension, Pt. on medications and Pt. on home beta blockers  Rhythm:Regular Rate:Normal  Echo: 1. Left ventricular ejection fraction, by estimation, is 50 to 55%. The  left ventricle has low normal function. The left ventricle has no regional  wall motion abnormalities. There is mild concentric left ventricular  hypertrophy. Left ventricular  diastolic parameters are consistent with Grade I diastolic dysfunction  (impaired relaxation).   2. Right ventricular systolic function is normal. The right ventricular  size is normal. There is moderately elevated pulmonary artery systolic  pressure. The estimated right ventricular systolic pressure is 46.2 mmHg.   3. Left atrial size was moderately dilated.   4. The mitral valve is normal in structure. Mild mitral valve  regurgitation. No evidence of mitral stenosis. Moderate to severe mitral  annular calcification.   5. The aortic valve is tricuspid. There is mild calcification of the  aortic valve. Aortic valve regurgitation is mild. Aortic valve  sclerosis/calcification is present, without any evidence of aortic  stenosis.   6. The inferior vena cava is normal in size with greater than 50%  respiratory variability, suggesting right atrial pressure of 3 mmHg.     Neuro/Psych negative neurological ROS  negative psych ROS   GI/Hepatic Neg liver ROS,GERD  ,,  Endo/Other  diabetes    Renal/GU Renal disease     Musculoskeletal  (+) Arthritis ,    Abdominal   Peds  Hematology negative hematology ROS (+)   Anesthesia  Other Findings   Reproductive/Obstetrics                             Anesthesia Physical Anesthesia Plan  ASA: 3  Anesthesia Plan: General   Post-op Pain Management: Ofirmev IV (intra-op)*   Induction: Intravenous  PONV Risk Score and Plan: 4 or greater and Ondansetron and Treatment may vary due to age or medical condition  Airway Management Planned: Oral ETT  Additional Equipment: None  Intra-op Plan:   Post-operative Plan: Extubation in OR  Informed Consent: I have reviewed the patients History and Physical, chart, labs and discussed the procedure including the risks, benefits and alternatives for the proposed anesthesia with the patient or authorized representative who has indicated his/her understanding and acceptance.     Dental advisory given  Plan Discussed with: CRNA  Anesthesia Plan Comments:        Anesthesia Quick Evaluation

## 2023-01-12 NOTE — Evaluation (Signed)
Physical Therapy Evaluation Patient Details Name: Laurie Fox MRN: [id number] DOB: 01/12/1932 Today's Date: 01/12/2023  History of Present Illness  Pt is 89 yo female who presented on 01/10/23 with R hip pain following being pulled down by her dog. Pt had femoral neck fx and underwent  anterior THA on 01/12/23. PMH: DM2, GERD, HTN, breast cancer  Clinical Impression  Pt admitted with above diagnosis. Pt is from home alone and independent with her dog, "Mocha", in a 2 level home with bedroom upstairs. Pt is agreeable to post acute rehab and reports that her family is going to move her into a single level home while she is in rehab.  On eval pt with some mild confusion, discomfort R hip, weakness RLE, and dizziness in sitting. Pt came to EOB with mod A and required mod A to stand with RW and pivot to recliner. Mobility was not advanced further due to dizziness. VSS. Patient will benefit from continued inpatient follow up therapy, <3 hours/day. Pt currently with functional limitations due to the deficits listed below (see PT Problem List). Pt will benefit from acute skilled PT to increase their independence and safety with mobility to allow discharge.          Recommendations for follow up therapy are one component of a multi-disciplinary discharge planning process, led by the attending physician.  Recommendations may be updated based on patient status, additional functional criteria and insurance authorization.  Follow Up Recommendations Can patient physically be transported by private vehicle: Yes     Assistance Recommended at Discharge Frequent or constant Supervision/Assistance  Patient can return home with the following  A lot of help with walking and/or transfers;A lot of help with bathing/dressing/bathroom;Assistance with cooking/housework;Assist for transportation;Help with stairs or ramp for entrance    Equipment Recommendations None recommended by PT  Recommendations for Other  Services  OT consult    Functional Status Assessment Patient has had a recent decline in their functional status and demonstrates the ability to make significant improvements in function in a reasonable and predictable amount of time.     Precautions / Restrictions Precautions Precautions: Fall Precaution Comments: no hip prec. Restrictions Weight Bearing Restrictions: No RLE Weight Bearing: Weight bearing as tolerated      Mobility  Bed Mobility Overal bed mobility: Needs Assistance Bed Mobility: Supine to Sit     Supine to sit: Mod assist     General bed mobility comments: mod A for mvmt of RLE to EOB and mod A for elavtion of trunk into sitting, vc's for problem solving    Transfers Overall transfer level: Needs assistance Equipment used: Rolling walker (2 wheels) Transfers: Sit to/from Stand, Bed to chair/wheelchair/BSC Sit to Stand: Mod assist   Step pivot transfers: Mod assist       General transfer comment: mod A for power up, took 2 attempts. Mod A for support with pivot steps to chair. Pt had diffciulty wt shifting to R to step L foot. Pt with mild dizziness when up    Ambulation/Gait               General Gait Details: did not progress due to dizziness  Stairs            Wheelchair Mobility    Modified Rankin (Stroke Patients Only)       Balance Overall balance assessment: Needs assistance, History of Falls Sitting-balance support: Feet supported, Single extremity supported Sitting balance-Leahy Scale: Fair     Standing balance support:  Reliant on assistive device for balance, During functional activity, Bilateral upper extremity supported Standing balance-Leahy Scale: Poor Standing balance comment: reliant on UE support and external assist                             Pertinent Vitals/Pain Pain Assessment Pain Assessment: Faces Faces Pain Scale: Hurts little more Pain Location: R hip Pain Descriptors / Indicators:  Grimacing, Guarding Pain Intervention(s): Limited activity within patient's tolerance, Monitored during session    Home Living Family/patient expects to be discharged to:: Skilled nursing facility Living Arrangements: Alone Available Help at Discharge: Family;Available PRN/intermittently Type of Home: House       Alternate Level Stairs-Number of Steps: 15 Home Layout: Two level        Prior Function Prior Level of Function : Independent/Modified Independent                     Hand Dominance        Extremity/Trunk Assessment   Upper Extremity Assessment Upper Extremity Assessment: Overall WFL for tasks assessed    Lower Extremity Assessment Lower Extremity Assessment: RLE deficits/detail RLE Deficits / Details: hip flex 2-/5, knee ext 2+/5 RLE: Unable to fully assess due to pain RLE Sensation: WNL RLE Coordination: decreased gross motor    Cervical / Trunk Assessment Cervical / Trunk Assessment: Kyphotic  Communication   Communication: No difficulties  Cognition Arousal/Alertness: Awake/alert Behavior During Therapy: WFL for tasks assessed/performed Overall Cognitive Status: Impaired/Different from baseline Area of Impairment: Memory, Problem solving                     Memory: Decreased short-term memory       Problem Solving: Difficulty sequencing, Requires verbal cues General Comments: pt has been mildly confused since surgery but able to give detailed history from before surgery        General Comments General comments (skin integrity, edema, etc.): reviewed WB status. SPO2 in 90's on RW when finger probe reading, but it was having a difficult time picking up. Family is going to work on moving pt into a single level home while she is at [facility]    Exercises Total Joint Exercises Ankle Circles/Pumps: AROM, Both, 10 reps, Supine, Seated Quad Sets: AROM, Both, 10 reps, Supine   Assessment/Plan    PT Assessment Patient needs continued PT  services  PT Problem List Decreased strength;Decreased range of motion;Decreased activity tolerance;Decreased balance;Decreased mobility;Decreased knowledge of use of DME;Decreased safety awareness;Decreased knowledge of precautions;Pain       PT Treatment Interventions DME instruction;Gait training;Functional mobility training;Therapeutic activities;Therapeutic exercise;Balance training;Neuromuscular re-education;Patient/family education;Cognitive remediation;Stair training    PT Goals (Current goals can be found in the Care Plan section)  Acute Rehab PT Goals Patient Stated Goal: return home to dog, "Mocha" PT Goal Formulation: With patient Time For Goal Achievement: 01/26/23 Potential to Achieve Goals: Good    Frequency Min 5X/week     Co-evaluation               AM-PAC PT "6 Clicks" Mobility  Outcome Measure Help needed turning from your back to your side while in a flat bed without using bedrails?: A Little Help needed moving from lying on your back to sitting on the side of a flat bed without using bedrails?: A Lot Help needed moving to and from a bed to a chair (including a wheelchair)?: A Lot Help needed standing up from a chair  using your arms (e.g., wheelchair or bedside chair)?: A Lot Help needed to walk in hospital room?: Total Help needed climbing 3-5 steps with a railing? : Total 6 Click Score: 11    End of Session Equipment Utilized During Treatment: Gait belt Activity Tolerance: Patient tolerated treatment well Patient left: in chair;with chair alarm set;with call bell/phone within reach Nurse Communication: Mobility status PT Visit Diagnosis: Unsteadiness on feet (R26.81);Muscle weakness (generalized) (M62.81);Pain;Difficulty in walking, not elsewhere classified (R26.2) Pain - Right/Left: Right Pain - part of body: Hip    Time: 4098-1191 PT Time Calculation (min) (ACUTE ONLY): 27 min   Charges:   PT Evaluation $PT Eval Moderate Complexity: 1 Mod PT  Treatments $Gait Training: 8-22 mins        Lyanne Co, PT  Acute Rehab Services Secure chat preferred Office [phone]   Lawana Chambers Dametrius Sanjuan 01/12/2023, 2:58 PM

## 2023-01-12 NOTE — Transfer of Care (Signed)
Immediate Anesthesia Transfer of Care Note  Patient: Laurie Fox  Procedure(s) Performed: TOTAL HIP ARTHROPLASTY ANTERIOR APPROACH (Right: Hip)  Patient Location: PACU  Anesthesia Type:General  Level of Consciousness: awake, alert , and oriented  Airway & Oxygen Therapy: Patient Spontanous Breathing and Patient connected to nasal cannula oxygen  Post-op Assessment: Report given to RN and Post -op Vital signs reviewed and stable  Post vital signs: Reviewed and stable  Last Vitals:  Vitals Value Taken Time  BP 153/67 01/12/23 1004  Temp 36.7 C 01/12/23 1004  Pulse 66 01/12/23 1009  Resp 14 01/12/23 1009  SpO2 97 % 01/12/23 1009  Vitals shown include unvalidated device data.  Last Pain:  Vitals:   01/12/23 0539  TempSrc: Oral  PainSc:       Patients Stated Pain Goal: 0 (01/11/23 2040)  Complications: No notable events documented.

## 2023-01-12 NOTE — Interval H&P Note (Signed)
History and Physical Interval Note:  01/12/2023 7:40 AM  Laurie Fox  has presented today for surgery, with the diagnosis of right hip fracture.  The various methods of treatment have been discussed with the patient and family. After consideration of risks, benefits and other options for treatment, the patient has consented to  Procedure(s): TOTAL HIP ARTHROPLASTY ANTERIOR APPROACH (Right) as a surgical intervention.  The patient's history has been reviewed, patient examined, no change in status, stable for surgery.  I have reviewed the patient's chart and labs.  Questions were answered to the patient's satisfaction.     Laurie Fox Mellissa Conley

## 2023-01-12 NOTE — Progress Notes (Signed)
PROGRESS NOTE    Laurie Fox  ZOX:[id number] DOB: 1931/11/02 DOA: 01/10/2023 PCP: Kirby Funk, MD (Inactive)   Brief Narrative:  HPI: Laurie Fox is a 89 y.o. female with medical history significant of breast cancer s/p lumpectomy and radiation therapy on chronic anastrozole, HTN, HLD, IIDM, multiple OA, presented with mechanical fall and right hip pain.   Patient was walking her dog on Sunday afternoon and dog suddenly dragged the patient, patient fell on her right hip.  Patient came to ED.  Sunday afternoon, trauma scan including right hip negative for fracture or dislocation and patient was given pain medication and sent home.  Over the weekend, patient started to have increasing pain associated with right hip ambulation and unable to put weight on right hip since yesterday because of excruciating pain.  And family brought her back to ED today.  At baseline patient lives alone in a two-story house able to climb 13 steps to upstairs with no shortness of breath or chest pain and she is able to walk her dog for 20-30 minutes without chest pain or shortness of breath.  No history of stroke or CAD, she only takes metformin for her diabetes   ED Course: Afebrile, borderline bradycardia blood pressure elevated nonhypoxic.  Trauma scan including right hip showing displaced right femoral neck fracture, no fracture found on ribs or elbow.  Blood work showed creatinine 1.4 compared to baseline 0.8 about 8 years ago, glucose 216, K4.0  Assessment & Plan:   Principal Problem:   Hip fracture (HCC) Active Problems:   Breast cancer of upper-inner quadrant of right female breast (HCC)   Closed displaced fracture of right femoral neck (HCC)   HTN (hypertension)   Type 2 diabetes mellitus with chronic kidney disease, without long-term current use of insulin (HCC)   Malnutrition of moderate degree  Right femoral neck fracture Secondary to mechanical fall: Orthopedics on board.  Underwent right  total hip arthroplasty by Dr. Linna Caprice 01/12/2023.  Management per orthopedics.   AKI vs CKD: No recent kidney function baseline reading in the last 8 years.  Currently creatinine improving and 1.1 today.   HTN, uncontrolled: Blood pressure still significantly elevated.  Again, she has not yet received her a.m. antihypertensive medications.  Continue those with as needed IV hydralazine and monitor.    Type 2 diabetes mellitus: Takes metformin at home which is on hold.  Continue SSI, blood sugar fairly controlled.  Hyperlipidemia: Continue statin.   History of breast cancer stage I -S/p radiation and lumpectomy -On chronic anastrozole  DVT prophylaxis: heparin injection 5,000 Units Start: 01/10/23 1545   Code Status: Full Code  Family Communication:  None present at bedside.  Plan of care discussed with patient in length and he/she verbalized understanding and agreed with it.  Status is: Inpatient Remains inpatient appropriate because: Underwent surgery today.   Estimated body mass index is 26.44 kg/m as calculated from the following:   Height as of this encounter: 5\' 3"  (1.6 m).   Weight as of this encounter: 67.7 kg.    Nutritional Assessment: Body mass index is 26.44 kg/m.Marland Kitchen Seen by dietician.  I agree with the assessment and plan as outlined below: Nutrition Status: Nutrition Problem: Moderate Malnutrition Etiology: social / environmental circumstances (inadequate oral intake, pt reports related to decreased appetite and lack of hunger) Signs/Symptoms: moderate fat depletion, moderate muscle depletion, severe muscle depletion Interventions: Refer to RD note for recommendations  . Skin Assessment: I have examined the patient's skin and  I agree with the wound assessment as performed by the wound care RN as outlined below:    Consultants:  Orthopedics  Procedures:  As above  Antimicrobials:  Anti-infectives (From admission, onward)    None          Subjective: Patient seen and examined in PACU after the surgery.  She was little groggy.  Appeared comfortable.  Objective: Vitals:   01/11/23 2319 01/12/23 0539 01/12/23 0638 01/12/23 0645  BP: (!) 111/54 (!) 191/59    Pulse: 64 65  (P) 63  Resp: 15 15    Temp: 97.9 F (36.6 C) 98.2 F (36.8 C)    TempSrc: Oral Oral    SpO2: 92% 97%    Weight:   67.7 kg   Height:   5\' 3"  (1.6 m)     Intake/Output Summary (Last 24 hours) at 01/12/2023 0727 Last data filed at 01/11/2023 2200 Gross per 24 hour  Intake 10 ml  Output 100 ml  Net -90 ml    Filed Weights   01/11/23 1400 01/12/23 0638  Weight: 67.7 kg 67.7 kg    Examination:  General exam: Appears calm and comfortable  Respiratory system: Clear to auscultation. Respiratory effort normal. Cardiovascular system: S1 & S2 heard, RRR. No JVD, murmurs, rubs, gallops or clicks. No pedal edema. Gastrointestinal system: Abdomen is nondistended, soft and nontender. No organomegaly or masses felt. Normal bowel sounds heard. Central nervous system: Slightly sleepy, still recovering from anesthesia.  Data Reviewed: I have personally reviewed following labs and imaging studies  CBC: Recent Labs  Lab 01/10/23 1347 01/12/23 0351  WBC 12.6* 9.0  NEUTROABS 10.9* 7.0  HGB 11.4* 10.1*  HCT 36.1 31.9*  MCV 85.1 81.4  PLT 299 358    Basic Metabolic Panel: Recent Labs  Lab 01/10/23 1347 01/12/23 0351  NA 137 138  K 4.0 3.5  CL 99 99  CO2 26 29  GLUCOSE 216* 222*  BUN 31* 34*  CREATININE 1.40* 1.10*  CALCIUM 9.8 9.3    GFR: Estimated Creatinine Clearance: 32.6 mL/min (A) (by C-G formula based on SCr of 1.1 mg/dL (H)). Liver Function Tests: No results for input(s): "AST", "ALT", "ALKPHOS", "BILITOT", "PROT", "ALBUMIN" in the last 168 hours. No results for input(s): "LIPASE", "AMYLASE" in the last 168 hours. No results for input(s): "AMMONIA" in the last 168 hours. Coagulation Profile: No results for input(s): "INR",  "PROTIME" in the last 168 hours. Cardiac Enzymes: No results for input(s): "CKTOTAL", "CKMB", "CKMBINDEX", "TROPONINI" in the last 168 hours. BNP (last 3 results) No results for input(s): "PROBNP" in the last 8760 hours. HbA1C: Recent Labs    01/10/23 1539  HGBA1C 6.7*    CBG: Recent Labs  Lab 01/11/23 0834 01/11/23 1203 01/11/23 1608 01/11/23 2023 01/12/23 0639  GLUCAP 222* 195* 132* 125* 170*    Lipid Profile: No results for input(s): "CHOL", "HDL", "LDLCALC", "TRIG", "CHOLHDL", "LDLDIRECT" in the last 72 hours. Thyroid Function Tests: No results for input(s): "TSH", "T4TOTAL", "FREET4", "T3FREE", "THYROIDAB" in the last 72 hours. Anemia Panel: No results for input(s): "VITAMINB12", "FOLATE", "FERRITIN", "TIBC", "IRON", "RETICCTPCT" in the last 72 hours. Sepsis Labs: No results for input(s): "PROCALCITON", "LATICACIDVEN" in the last 168 hours.  No results found for this or any previous visit (from the past 240 hour(s)).   Radiology Studies: ECHOCARDIOGRAM COMPLETE  Result Date: 01/11/2023    ECHOCARDIOGRAM REPORT   Patient Name:   BERDA CORONEL Date of Exam: 01/11/2023 Medical Rec #:  [id number]  Height:       63.5 in Accession #:    [id number]       Weight:       182.1 lb Date of Birth:  1932/06/28        BSA:          1.869 m Patient Age:    89 years         BP:           198/83 mmHg Patient Gender: F                HR:           58 bpm. Exam Location:  Inpatient Procedure: 2D Echo, Cardiac Doppler and Color Doppler Indications:    Pre-Op  History:        Patient has no prior history of Echocardiogram examinations.                 Risk Factors:Hypertension and Diabetes.  Sonographer:    Darlys Gales Referring Phys: [id number] PING T ZHANG IMPRESSIONS  1. Left ventricular ejection fraction, by estimation, is 50 to 55%. The left ventricle has low normal function. The left ventricle has no regional wall motion abnormalities. There is mild concentric left ventricular  hypertrophy. Left ventricular diastolic parameters are consistent with Grade I diastolic dysfunction (impaired relaxation).  2. Right ventricular systolic function is normal. The right ventricular size is normal. There is moderately elevated pulmonary artery systolic pressure. The estimated right ventricular systolic pressure is 46.2 mmHg.  3. Left atrial size was moderately dilated.  4. The mitral valve is normal in structure. Mild mitral valve regurgitation. No evidence of mitral stenosis. Moderate to severe mitral annular calcification.  5. The aortic valve is tricuspid. There is mild calcification of the aortic valve. Aortic valve regurgitation is mild. Aortic valve sclerosis/calcification is present, without any evidence of aortic stenosis.  6. The inferior vena cava is normal in size with greater than 50% respiratory variability, suggesting right atrial pressure of 3 mmHg. FINDINGS  Left Ventricle: Left ventricular ejection fraction, by estimation, is 50 to 55%. The left ventricle has low normal function. The left ventricle has no regional wall motion abnormalities. The left ventricular internal cavity size was normal in size. There is mild concentric left ventricular hypertrophy. Left ventricular diastolic parameters are consistent with Grade I diastolic dysfunction (impaired relaxation). Right Ventricle: The right ventricular size is normal. No increase in right ventricular wall thickness. Right ventricular systolic function is normal. There is moderately elevated pulmonary artery systolic pressure. The tricuspid regurgitant velocity is 3.21 m/s, and with an assumed right atrial pressure of 5 mmHg, the estimated right ventricular systolic pressure is 46.2 mmHg. Left Atrium: Left atrial size was moderately dilated. Right Atrium: Right atrial size was normal in size. Pericardium: There is no evidence of pericardial effusion. Mitral Valve: The mitral valve is normal in structure. Moderate to severe mitral  annular calcification. Mild mitral valve regurgitation. No evidence of mitral valve stenosis. MV peak gradient, 6.2 mmHg. The mean mitral valve gradient is 2.0 mmHg. Tricuspid Valve: The tricuspid valve is normal in structure. Tricuspid valve regurgitation is mild . No evidence of tricuspid stenosis. Aortic Valve: The aortic valve is tricuspid. There is mild calcification of the aortic valve. Aortic valve regurgitation is mild. Aortic valve sclerosis/calcification is present, without any evidence of aortic stenosis. Aortic valve mean gradient measures 3.0 mmHg. Aortic valve peak gradient measures 7.7 mmHg. Aortic valve area, by VTI measures 2.37 cm. Pulmonic  Valve: The pulmonic valve was grossly normal. Pulmonic valve regurgitation is not visualized. No evidence of pulmonic stenosis. Aorta: The aortic root is normal in size and structure. Venous: The inferior vena cava is normal in size with greater than 50% respiratory variability, suggesting right atrial pressure of 3 mmHg. IAS/Shunts: No atrial level shunt detected by color flow Doppler.  LEFT VENTRICLE PLAX 2D LVIDd:         4.90 cm   Diastology LVIDs:         3.70 cm   LV e' medial:    3.92 cm/s LV PW:         1.20 cm   LV E/e' medial:  17.0 LV IVS:        1.30 cm   LV e' lateral:   4.46 cm/s LVOT diam:     1.80 cm   LV E/e' lateral: 14.9 LV SV:         68 LV SV Index:   36 LVOT Area:     2.54 cm  RIGHT VENTRICLE             IVC RV S prime:     12.10 cm/s  IVC diam: 1.90 cm TAPSE (M-mode): 2.3 cm LEFT ATRIUM             Index        RIGHT ATRIUM           Index LA Vol (A2C):   78.2 ml 41.84 ml/m  RA Area:     12.30 cm LA Vol (A4C):   70.2 ml 37.56 ml/m  RA Volume:   25.70 ml  13.75 ml/m LA Biplane Vol: 74.4 ml 39.81 ml/m  AORTIC VALVE AV Area (Vmax):    2.29 cm AV Area (Vmean):   2.66 cm AV Area (VTI):     2.37 cm AV Vmax:           139.00 cm/s AV Vmean:          86.400 cm/s AV VTI:            0.288 m AV Peak Grad:      7.7 mmHg AV Mean Grad:      3.0  mmHg LVOT Vmax:         125.00 cm/s LVOT Vmean:        90.400 cm/s LVOT VTI:          0.268 m LVOT/AV VTI ratio: 0.93  AORTA Ao Root diam: 2.70 cm Ao Asc diam:  3.40 cm MITRAL VALVE                TRICUSPID VALVE MV Area (PHT): 1.82 cm     TR Peak grad:   41.2 mmHg MV Area VTI:   1.63 cm     TR Vmax:        321.00 cm/s MV Peak grad:  6.2 mmHg MV Mean grad:  2.0 mmHg     SHUNTS MV Vmax:       1.25 m/s     Systemic VTI:  0.27 m MV Vmean:      67.2 cm/s    Systemic Diam: 1.80 cm MV Decel Time: 416 msec MV E velocity: 66.60 cm/s MV A velocity: 122.00 cm/s MV E/A ratio:  0.55 Arvilla Meres MD Electronically signed by Arvilla Meres MD Signature Date/Time: 01/11/2023/9:10:24 AM    Final    DG Ribs Unilateral W/Chest Right  Result Date: 01/10/2023 CLINICAL DATA:  Right rib pain after fall.  EXAM: RIGHT RIBS AND CHEST - 3+ VIEW COMPARISON:  None Available. FINDINGS: No fracture or other bone lesions are seen involving the ribs. There is no evidence of pneumothorax or pleural effusion. Both lungs are clear. Heart size and mediastinal contours are within normal limits. IMPRESSION: Negative. Electronically Signed   By: Lupita Raider M.D.   On: 01/10/2023 15:13   DG Elbow Complete Right  Result Date: 01/10/2023 CLINICAL DATA:  Fall on cement stairs today.  Right elbow. EXAM: RIGHT ELBOW - COMPLETE 3+ VIEW COMPARISON:  Right elbow radiographs 01/06/2023 FINDINGS: There is diffuse decreased bone mineralization. Unchanged well corticated likely small loose body overlying the radiocapitellar joint. Mild to moderate medial elbow joint space narrowing and peripheral osteophytosis at the trochlea-coronoid process. Mild radiocapitellar joint space narrowing. Mild chronic enthesopathic change at the common extensor greater than common flexor tendon origins. Mild degenerative spurring at the radial tuberosity at the biceps tendon insertion is unchanged. Minimal mineralization of the triceps tendon insertion on the olecranon  is unchanged from prior. There is again posterior elbow soft tissue swelling. No definite elbow joint effusion. IMPRESSION: 1. No significant change from recent 01/06/2023 elbow radiographs. 2. Posterior elbow soft tissue swelling. Mild age indeterminate mineralization at the triceps insertion on the olecranon, an age indeterminate avulsion injury versus enthesopathic change as previously described. There is posterior elbow soft tissue sign which may be secondary to the patient's impact fall. 3. Mild-to-moderate medial elbow and mild radiocapitellar osteoarthritis. Electronically Signed   By: Neita Garnet M.D.   On: 01/10/2023 13:52   DG Hip Unilat  With Pelvis 2-3 Views Right  Result Date: 01/10/2023 CLINICAL DATA:  [place]alen sign stairs today.  Right hip pain. EXAM: DG HIP (WITH OR WITHOUT PELVIS) 2-3V RIGHT COMPARISON:  Pelvis and bilateral hip radiographs 01/06/2023 FINDINGS: There is diffuse decreased bone mineralization. There is an acute fracture of the subcapital proximal right femoral neck with approximately 9 mm superior displacement of the distal fracture, with respect to the proximal fracture component. Mild varus angulation. The fracture line is not well evaluated on lateral view due to overlying soft tissues. Mild bilateral femoroacetabular joint space narrowing. Mild bilateral sacroiliac subchondral sclerosis. Mild inferior pubic symphysis subchondral sclerosis. Vascular phleboliths overlie the pelvis. IMPRESSION: Acute fracture of the subcapital proximal right femoral neck with approximately 9 mm superior displacement and mild varus angulation. Electronically Signed   By: Neita Garnet M.D.   On: 01/10/2023 13:34   DG Hand Complete Right  Result Date: 01/10/2023 CLINICAL DATA:  Slipped and fell on cement stairs today. Pain to right hand. EXAM: RIGHT HAND - COMPLETE 3+ VIEW COMPARISON:  None Available. FINDINGS: There is diffuse decreased bone mineralization. Degenerative changes including joint  space narrowing, subchondral sclerosis/cystic change, peripheral osteophytosis are severe at the thumb carpometacarpal joint and index finger PIP and DIP joints; moderate to severe at the rest of the interphalangeal joints; moderate at the first through third metacarpophalangeal joints; and mild at the triscaphe joint. No acute fracture is seen.  No dislocation. IMPRESSION: 1. No acute fracture is seen. 2. Moderate to severe osteoarthritis, as above. Electronically Signed   By: Neita Garnet M.D.   On: 01/10/2023 13:32    Scheduled Meds:  [MAR Hold] aspirin  81 mg Oral Daily   [MAR Hold] carvedilol  25 mg Oral BID WC   chlorhexidine       [MAR Hold] feeding supplement  237 mL Oral BID BM   [MAR Hold] heparin  5,000 Units  Subcutaneous Q12H   [MAR Hold] irbesartan  150 mg Oral Daily   And   [MAR Hold] hydrochlorothiazide  12.5 mg Oral Daily   [MAR Hold] insulin aspart  0-9 Units Subcutaneous TID WC   [MAR Hold] melatonin  3 mg Oral QHS   [MAR Hold] multivitamin with minerals  1 tablet Oral Daily   [MAR Hold] pantoprazole  40 mg Oral Daily   [MAR Hold] simvastatin  20 mg Oral q1800   Continuous Infusions:  lactated ringers 10 mL/hr at 01/12/23 0654     LOS: 2 days   Hughie Closs, MD [facility]  01/12/2023, 7:27 AM   *Please note that this is a verbal dictation therefore any spelling or grammatical errors are due to the "Dragon Medical One" system interpretation.  Please page via Amion and do not message via secure chat for urgent patient care matters. Secure chat can be used for non urgent patient care matters.  How to contact the [facility] Attending or Consulting provider 7A - 7P or covering provider during after hours 7P -7A, for this patient?  Check the care team in [facility] and look for a) attending/consulting TRH provider listed and b) the [facility] team listed. Page or secure chat 7A-7P. Log into [email or web address] and use [facility] universal password to access. If you do not have the  password, please contact the hospital operator. Locate the [facility] provider you are looking for under [facility] and page to a number that you can be directly reached. If you still have difficulty reaching the provider, please page the [facility] (Director on Call) for the Hospitalists listed on amion for assistance.

## 2023-01-13 DIAGNOSIS — S72001A Fracture of unspecified part of neck of right femur, initial encounter for closed fracture: Secondary | ICD-10-CM | POA: Diagnosis not present

## 2023-01-13 LAB — GLUCOSE, CAPILLARY
Glucose-Capillary: 169 mg/dL — ABNORMAL HIGH (ref 70–99)
Glucose-Capillary: 139 mg/dL — ABNORMAL HIGH (ref 70–99)
Glucose-Capillary: 165 mg/dL — ABNORMAL HIGH (ref 70–99)

## 2023-01-13 LAB — BASIC METABOLIC PANEL
Anion gap: 11 (ref 5–15)
BUN: 34 mg/dL — ABNORMAL HIGH (ref 8–23)
CO2: 25 mmol/L (ref 22–32)
Calcium: 8.7 mg/dL — ABNORMAL LOW (ref 8.9–10.3)
Chloride: 102 mmol/L (ref 98–111)
Creatinine, Ser: 1.23 mg/dL — ABNORMAL HIGH (ref 0.44–1.00)
GFR, Estimated: 42 mL/min — ABNORMAL LOW (ref >60)
Glucose, Bld: 212 mg/dL — ABNORMAL HIGH (ref 70–99)
Potassium: 3.6 mmol/L (ref 3.5–5.1)
Sodium: 138 mmol/L (ref 135–145)

## 2023-01-13 LAB — CBC
HCT: 27.5 % — ABNORMAL LOW (ref 36.0–46.0)
Hemoglobin: 8.5 g/dL — ABNORMAL LOW (ref 12.0–15.0)
MCH: 26.4 pg (ref 26.0–34.0)
MCHC: 30.9 g/dL (ref 30.0–36.0)
MCV: 85.4 fL (ref 80.0–100.0)
Platelets: 296 10*3/uL (ref 150–400)
RBC: 3.22 MIL/uL — ABNORMAL LOW (ref 3.87–5.11)
RDW: 19.8 % — ABNORMAL HIGH (ref 11.5–15.5)
WBC: 7.8 10*3/uL (ref 4.0–10.5)
nRBC: 0 % (ref 0.0–0.2)

## 2023-01-13 NOTE — Progress Notes (Signed)
OT Cancellation Note  Patient Details Name: Laurie Fox MRN: [id number] DOB: [birth year]   Cancelled Treatment:    Reason Eval/Treat Not Completed: Other (comment);Pain limiting ability to participate (Pt declined participation in skilled OT eval secondary to pain and having family present. Pt requests holding OT eval until tomorrow (01/14/23). OT to continue to attempt to see pt for skilled OT eval.)  Amil Bouwman "Orson Eva., OTR/L, MA Acute Rehab [phone]   Lendon Colonel 01/13/2023, 5:46 PM

## 2023-01-13 NOTE — TOC Initial Note (Signed)
Transition of Care [facility]) - Initial/Assessment Note    Patient Details  Name: Laurie Fox MRN: [id number] Date of Birth: [birth year]  Transition of Care [facility]) CM/SW Contact:    Delilah Shan, LCSWA Phone Number: 01/13/2023, 10:35 AM  Clinical Narrative:                  CSW received consult for possible SNF placement at time of discharge. CSW spoke with patient regarding PT recommendation of SNF placement at time of discharge. Patient reports PTA she comes from home alone. Patient expressed understanding of PT recommendation and is agreeable to SNF placement at time of discharge. Patient gave CSW permission to fax out initial referral near the [facility] area for possible SNF placement. CSW discussed insurance authorization process and will provide Medicare SNF ratings list when available.  No further questions reported at this time. CSW to continue to follow and assist with discharge planning needs.   Expected Discharge Plan: Skilled Nursing Facility Barriers to Discharge: Continued Medical Work up   Patient Goals and CMS Choice Patient states their goals for this hospitalization and ongoing recovery are:: SNF CMS Medicare.gov Compare Post Acute Care list provided to:: Patient Choice offered to / list presented to : Patient [place] ownership interest in [facility].provided to:: Patient    Expected Discharge Plan and Services In-house Referral: Clinical Social Work Discharge Planning Services: CM Consult Post Acute Care Choice: Skilled Nursing Facility Living arrangements for the past 2 months: Single Family Home                                      Prior Living Arrangements/Services Living arrangements for the past 2 months: Single Family Home Lives with:: Self Patient language and need for interpreter reviewed:: Yes Do you feel safe going back to the place where you live?: No   SNF  Need for Family Participation in Patient Care: Yes (Comment) Care  giver support system in place?: Yes (comment) Current home services: DME Gilmer Mor, RW) Criminal Activity/Legal Involvement Pertinent to Current Situation/Hospitalization: No - Comment as needed  Activities of Daily Living      Permission Sought/Granted Permission sought to share information with : Case Manager, Family Supports, Magazine features editor Permission granted to share information with : Yes, Verbal Permission Granted  Share Information with NAME: Casimiro Needle  Permission granted to share info w AGENCY: SNF  Permission granted to share info w Relationship: son  Permission granted to share info w Contact Information: Casimiro Needle [phone]  Emotional Assessment Appearance:: Appears stated age Attitude/Demeanor/Rapport: Gracious Affect (typically observed): Calm Orientation: : Oriented to Self, Oriented to Place, Oriented to  Time, Oriented to Situation Alcohol / Substance Use: Not Applicable Psych Involvement: No (comment)  Admission diagnosis:  Hip fracture (HCC) [S72.009A] Closed fracture of right hip, initial encounter (HCC) [S72.001A] Patient Active Problem List   Diagnosis Date Noted   Malnutrition of moderate degree 01/12/2023   Closed displaced fracture of right femoral neck (HCC) 01/10/2023   HTN (hypertension) 01/10/2023   Type 2 diabetes mellitus with chronic kidney disease, without long-term current use of insulin (HCC) 01/10/2023   Hip fracture (HCC) 01/10/2023   Breast cancer of upper-inner quadrant of right female breast (HCC) 07/17/2013   PCP:  Kirby Funk, MD (Inactive) Pharmacy:   [id number] - [place], [place] - [address] [place] [address] Phone: [phone] Fax: [phone]  [facility]) - [place], [place] - [address] [facility] [address] [place] [address] [place] [address] Phone: [phone] Fax: [phone]  [facility] - [place], [place] - [address] [place] [address] Phone: [phone] Fax: [phone]     Social Determinants of Health (SDOH) Social History: SDOH Screenings   Tobacco Use: Unknown (01/12/2023)   SDOH Interventions:     Readmission Risk Interventions    01/11/2023    2:10 PM  Readmission Risk Prevention Plan  Transportation Screening Complete  PCP or Specialist Appt within 5-7 Days Complete  Home Care Screening Complete  Medication Review (RN CM) Complete

## 2023-01-13 NOTE — [place]2 (Signed)
[place] MEDICAID FL2 LEVEL OF CARE FORM     IDENTIFICATION  Patient Name: Laurie Fox Birthdate: 06/28/1932 Sex: female Admission Date (Current Location): 01/10/2023  [facility] and [place] Number:  Producer, television/film/video and Address:  [facility], [address] [place], [place] [address]      Provider Number: [id number]  Attending Physician Name and Address:  Hughie Closs, MD  Relative Name and Phone Number:  Casimiro Needle [phone]    Current Level of Care: Hospital Recommended Level of Care: Skilled Nursing Facility Prior Approval Number:    Date Approved/Denied:   PASRR Number: [id number]  Discharge Plan: SNF    Current Diagnoses: Patient Active Problem List   Diagnosis Date Noted   Malnutrition of moderate degree 01/12/2023   Closed displaced fracture of right femoral neck (HCC) 01/10/2023   HTN (hypertension) 01/10/2023   Type 2 diabetes mellitus with chronic kidney disease, without long-term current use of insulin (HCC) 01/10/2023   Hip fracture (HCC) 01/10/2023   Breast cancer of upper-inner quadrant of right female breast (HCC) 07/17/2013    Orientation RESPIRATION BLADDER Height & Weight     Self, Time, Situation, Place  O2 (1.5 liters of Nasal Cannula) Incontinent, External catheter (External Urinary Catheter) Weight: 149 lb 4 oz (67.7 kg) Height:  5\' 3"  (160 cm)  BEHAVIORAL SYMPTOMS/MOOD NEUROLOGICAL BOWEL NUTRITION STATUS      Continent Diet (Please see discharge summary)  AMBULATORY STATUS COMMUNICATION OF NEEDS Skin   Extensive Assist Verbally Other (Comment) (Wound/Incision LDAs,Incision closed,Hip,Right)                       Personal Care Assistance Level of Assistance  Bathing, Feeding, Dressing Bathing Assistance: Maximum assistance Feeding assistance: Independent Dressing Assistance: Maximum assistance     Functional Limitations Info  Sight, Hearing, Speech Sight Info: Adequate Hearing Info:  Impaired Speech Info: Adequate    SPECIAL CARE FACTORS FREQUENCY  PT (By licensed PT), OT (By licensed OT)     PT Frequency: 5x min weekly OT Frequency: 5x min weekly            Contractures Contractures Info: Not present    Additional Factors Info  Code Status, Allergies, Insulin Sliding Scale Code Status Info: FULL Allergies Info: Codeine,Sulfa Antibiotics   Insulin Sliding Scale Info: insulin aspart (novoLOG) injection 0-9 Units 3 times daily with meals       Current Medications (01/13/2023):  This is the current hospital active medication list Current Facility-Administered Medications  Medication Dose Route Frequency Provider Last Rate Last Admin   0.9 %  sodium chloride infusion   Intravenous Continuous Swinteck, Arlys John, MD 100 mL/hr at 01/12/23 2000 Infusion Verify at 01/12/23 2000   alum & mag hydroxide-simeth (MAALOX/MYLANTA) 200-200-20 MG/5ML suspension 30 mL  30 mL Oral Q4H PRN Swinteck, Arlys John, MD       aspirin chewable tablet 81 mg  81 mg Oral BID Samson Frederic, MD   81 mg at 01/12/23 2125   carvedilol (COREG) tablet 25 mg  25 mg Oral BID WC Swinteck, Arlys John, MD   25 mg at 01/13/23 0841   diphenhydrAMINE (BENADRYL) 12.5 MG/5ML elixir 12.5-25 mg  12.5-25 mg Oral Q4H PRN Swinteck, Arlys John, MD       docusate sodium (COLACE) capsule 100 mg  100 mg Oral BID Samson Frederic, MD   100 mg at 01/12/23 2126   feeding supplement (ENSURE ENLIVE / ENSURE PLUS) liquid 237 mL  237 mL  Oral BID BM Swinteck, Arlys John, MD       hydrALAZINE (APRESOLINE) injection 10 mg  10 mg Intravenous Q6H PRN Samson Frederic, MD   10 mg at 01/11/23 2040   irbesartan (AVAPRO) tablet 150 mg  150 mg Oral Daily Swinteck, Arlys John, MD   150 mg at 01/11/23 1610   And   hydrochlorothiazide (HYDRODIURIL) tablet 12.5 mg  12.5 mg Oral Daily Swinteck, Arlys John, MD   12.5 mg at 01/11/23 0947   HYDROcodone-acetaminophen (NORCO/VICODIN) 5-325 MG per tablet 1-2 tablet  1-2 tablet Oral Q6H PRN Samson Frederic, MD   2 tablet at  01/13/23 0417   HYDROmorphone (DILAUDID) injection 0.5 mg  0.5 mg Intravenous Q2H PRN Samson Frederic, MD   0.5 mg at 01/11/23 2040   insulin aspart (novoLOG) injection 0-9 Units  0-9 Units Subcutaneous TID WC Samson Frederic, MD   2 Units at 01/13/23 0841   melatonin tablet 3 mg  3 mg Oral QHS Swinteck, Arlys John, MD   3 mg at 01/12/23 2126   melatonin tablet 5 mg  5 mg Oral QHS PRN Swinteck, Arlys John, MD       menthol-cetylpyridinium (CEPACOL) lozenge 3 mg  1 lozenge Oral PRN Samson Frederic, MD       Or   phenol (CHLORASEPTIC) mouth spray 1 spray  1 spray Mouth/Throat PRN Swinteck, Arlys John, MD       methocarbamol (ROBAXIN) tablet 500 mg  500 mg Oral Q6H PRN Samson Frederic, MD   500 mg at 01/13/23 0417   Or   methocarbamol (ROBAXIN) 500 mg in dextrose 5 % 50 mL IVPB  500 mg Intravenous Q6H PRN Swinteck, Arlys John, MD       metoCLOPramide (REGLAN) tablet 5-10 mg  5-10 mg Oral Q8H PRN Swinteck, Arlys John, MD       Or   metoCLOPramide (REGLAN) injection 5-10 mg  5-10 mg Intravenous Q8H PRN Swinteck, Arlys John, MD       multivitamin with minerals tablet 1 tablet  1 tablet Oral Daily Swinteck, Arlys John, MD       ondansetron [facility]) tablet 4 mg  4 mg Oral Q6H PRN Swinteck, Arlys John, MD       Or   ondansetron (ZOFRAN) injection 4 mg  4 mg Intravenous Q6H PRN Swinteck, Arlys John, MD       pantoprazole (PROTONIX) EC tablet 40 mg  40 mg Oral Daily Swinteck, Arlys John, MD   40 mg at 01/11/23 0947   polyethylene glycol (MIRALAX / GLYCOLAX) packet 17 g  17 g Oral Daily PRN Swinteck, Arlys John, MD       polyvinyl alcohol (LIQUIFILM TEARS) 1.4 % ophthalmic solution 1 drop  1 drop Both Eyes TID PRN Swinteck, Arlys John, MD       senna (SENOKOT) tablet 8.6 mg  1 tablet Oral BID Samson Frederic, MD   8.6 mg at 01/12/23 2126   simvastatin (ZOCOR) tablet 20 mg  20 mg Oral q1800 Samson Frederic, MD   20 mg at 01/12/23 1731     Discharge Medications: Please see discharge summary for a list of discharge medications.  Relevant Imaging  Results:  Relevant Lab Results:   Additional Information [id number]  Delilah Shan, LCSWA

## 2023-01-13 NOTE — Plan of Care (Signed)
Patient AOX4, forgetful, VSS throughout shift with elevated BP.  Pt c/o generalized pressure pain.  PRN robaxin and norco given for pain.  All meds given on time as ordered.  Diminished lungs, IS encouraged.  Purewick in place.  POC maintained, will continue to monitor.   Problem: Education: Goal: Ability to describe self-care measures that may prevent or decrease complications (Diabetes Survival Skills Education) will improve Outcome: Progressing Goal: Individualized Educational Video(s) Outcome: Progressing   Problem: Coping: Goal: Ability to adjust to condition or change in health will improve Outcome: Progressing   Problem: Fluid Volume: Goal: Ability to maintain a balanced intake and output will improve Outcome: Progressing   Problem: Health Behavior/Discharge Planning: Goal: Ability to identify and utilize available resources and services will improve Outcome: Progressing Goal: Ability to manage health-related needs will improve Outcome: Progressing   Problem: Metabolic: Goal: Ability to maintain appropriate glucose levels will improve Outcome: Progressing   Problem: Nutritional: Goal: Maintenance of adequate nutrition will improve Outcome: Progressing Goal: Progress toward achieving an optimal weight will improve Outcome: Progressing   Problem: Skin Integrity: Goal: Risk for impaired skin integrity will decrease Outcome: Progressing   Problem: Tissue Perfusion: Goal: Adequacy of tissue perfusion will improve Outcome: Progressing   Problem: Education: Goal: Knowledge of General Education information will improve Description: Including pain rating scale, medication(s)/side effects and non-pharmacologic comfort measures Outcome: Progressing   Problem: Health Behavior/Discharge Planning: Goal: Ability to manage health-related needs will improve Outcome: Progressing   Problem: Clinical Measurements: Goal: Ability to maintain clinical measurements within normal  limits will improve Outcome: Progressing Goal: Will remain free from infection Outcome: Progressing Goal: Diagnostic test results will improve Outcome: Progressing Goal: Respiratory complications will improve Outcome: Progressing Goal: Cardiovascular complication will be avoided Outcome: Progressing   Problem: Activity: Goal: Risk for activity intolerance will decrease Outcome: Progressing   Problem: Nutrition: Goal: Adequate nutrition will be maintained Outcome: Progressing   Problem: Coping: Goal: Level of anxiety will decrease Outcome: Progressing   Problem: Elimination: Goal: Will not experience complications related to bowel motility Outcome: Progressing Goal: Will not experience complications related to urinary retention Outcome: Progressing   Problem: Pain Managment: Goal: General experience of comfort will improve Outcome: Progressing   Problem: Safety: Goal: Ability to remain free from injury will improve Outcome: Progressing   Problem: Skin Integrity: Goal: Risk for impaired skin integrity will decrease Outcome: Progressing   Problem: Education: Goal: Knowledge of the prescribed therapeutic regimen will improve Outcome: Progressing Goal: Understanding of discharge needs will improve Outcome: Progressing Goal: Individualized Educational Video(s) Outcome: Progressing   Problem: Activity: Goal: Ability to avoid complications of mobility impairment will improve Outcome: Progressing Goal: Ability to tolerate increased activity will improve Outcome: Progressing   Problem: Clinical Measurements: Goal: Postoperative complications will be avoided or minimized Outcome: Progressing   Problem: Pain Management: Goal: Pain level will decrease with appropriate interventions Outcome: Progressing   Problem: Skin Integrity: Goal: Will show signs of wound healing Outcome: Progressing

## 2023-01-13 NOTE — Progress Notes (Signed)
Office Visit Note   Patient: Laurie Fox           Date of Birth: 11/20/1931           MRN: [id number] Visit Date: 01/10/2023              Requested by: No referring provider defined for this encounter. PCP: Kirby Funk, MD (Inactive)   Assessment & Plan: Visit Diagnoses:  1. Pain in right elbow   2. Pain in right hip     Plan: impression is 89 year old female s/p mechanical fall and failure to thrive at home.  From my standpoint, I have recommended that her daughter take her back to the ED to be assessed for admission to a SNF.    Follow-Up Instructions: No follow-ups on file.   Orders:  No orders of the defined types were placed in this encounter.  No orders of the defined types were placed in this encounter.     Procedures: No procedures performed   Clinical Data: No additional findings.   Subjective: Chief Complaint  Patient presents with   Right Elbow - Pain   Right Hip - Pain    HPI Patient is a 89 year old female here for follow up from the ED from 01/06/23 status post ground level fall. Her daughter brings her in.  She's in a wheelchair. They report that she's been having a lot of difficulty at home and they feel that she needs to go to a nursing home.   Review of Systems  Constitutional: Negative.   HENT: Negative.    Eyes: Negative.   Respiratory: Negative.    Cardiovascular: Negative.   Endocrine: Negative.   Musculoskeletal: Negative.   Neurological: Negative.   Hematological: Negative.   Psychiatric/Behavioral: Negative.    All other systems reviewed and are negative.    Objective: Vital Signs: There were no vitals taken for this visit.  Physical Exam Vitals and nursing note reviewed.  Constitutional:      Appearance: She is well-developed.  HENT:     Head: Atraumatic.     Nose: Nose normal.  Eyes:     Extraocular Movements: Extraocular movements intact.  Cardiovascular:     Pulses: Normal pulses.  Pulmonary:     Effort:  Pulmonary effort is normal.  Abdominal:     Palpations: Abdomen is soft.  Musculoskeletal:     Cervical back: Neck supple.  Skin:    General: Skin is warm.     Capillary Refill: Capillary refill takes less than 2 seconds.  Neurological:     Mental Status: She is alert. Mental status is at baseline.  Psychiatric:        Behavior: Behavior normal.        Thought Content: Thought content normal.        Judgment: Judgment normal.     Ortho Exam Right arm exam shows extensive ecchymosis and multiple skin tears without neurovascular compromise.  Elbow range of motion is mildly painful.  Right lower extremity exam shows she is unable to stand and bear weight. She has pain with movement of the leg.  No neurovascular compromise. Specialty Comments:  No specialty comments available.  Imaging: DG HIP UNILAT WITH PELVIS 1V RIGHT  Result Date: 01/12/2023 CLINICAL DATA:  Fluoroscopic assistance for right hip arthroplasty EXAM: DG HIP (WITH OR WITHOUT PELVIS) 1V RIGHT COMPARISON:  01/10/2023 FINDINGS: Fluoroscopic images show right hip arthroplasty. Fracture is noted in the neck of the right femur  in the early images. Fluoroscopic time 12 seconds. Radiation dose 1.09 mGy. IMPRESSION: Fluoroscopic assistance was provided for right hip arthroplasty. Electronically Signed   By: Ernie Avena M.D.   On: 01/12/2023 13:14   DG Pelvis Portable  Result Date: 01/12/2023 CLINICAL DATA:  Status post right hip arthroplasty. EXAM: PORTABLE PELVIS 1-2 VIEWS COMPARISON:  None Available. FINDINGS: Frontal projection of the pelvis demonstrates normal alignment of a bipolar hemiarthroplasty of the right hip. No surrounding fracture or abnormal lucency. The visualized pelvis demonstrates normal appearance. IMPRESSION: Normal alignment of bipolar hemiarthroplasty of the right hip. Electronically Signed   By: Irish Lack M.D.   On: 01/12/2023 11:48   DG C-Arm 1-60 Min-No Report  Result Date:  01/12/2023 Fluoroscopy was utilized by the requesting physician.  No radiographic interpretation.     PMFS History: Patient Active Problem List   Diagnosis Date Noted   Malnutrition of moderate degree 01/12/2023   Closed displaced fracture of right femoral neck (HCC) 01/10/2023   HTN (hypertension) 01/10/2023   Type 2 diabetes mellitus with chronic kidney disease, without long-term current use of insulin (HCC) 01/10/2023   Hip fracture (HCC) 01/10/2023   Breast cancer of upper-inner quadrant of right female breast (HCC) 07/17/2013   Past Medical History:  Diagnosis Date   Allergy    Arthritis    knees, osteo, shoulders,fingers   Breast cancer (HCC) 07/15/13   right breast    Diabetes mellitus without complication (HCC)    type II   GERD (gastroesophageal reflux disease)    History of radiation therapy 09/15/13-10/12/13   right breast   Hypertension    Personal history of radiation therapy    Wears glasses     Family History  Problem Relation Age of Onset   Alzheimer's disease Mother    Colon cancer Father     Past Surgical History:  Procedure Laterality Date   BREAST BIOPSY Right 07/15/13   bx=mass 1 0'clock, invasive mammary ca, mammary ca in situ   BREAST LUMPECTOMY     BREAST LUMPECTOMY WITH NEEDLE LOCALIZATION AND AXILLARY SENTINEL LYMPH NODE BX Right 08/11/2013   Procedure: RIGHT BREAST LUMPECTOMY WITH NEEDLE LOCALIZATION AND AXILLARY SENTINEL LYMPH NODE BIOPSY;  Surgeon: Almond Lint, MD;  Location: [facility];  Service: General;  Laterality: Right;   COLONOSCOPY     DILATION AND CURETTAGE OF UTERUS     FRACTURE SURGERY  2006   lt foot   KNEE SURGERY  [phone]   lt and rt knee scopes   TONSILLECTOMY     Social History   Occupational History   Not on file  Tobacco Use   Smoking status: Never   Smokeless tobacco: Not on file  Substance and Sexual Activity   Alcohol use: No   Drug use: No   Sexual activity: Not Currently

## 2023-01-13 NOTE — Progress Notes (Signed)
PROGRESS NOTE    Laurie Fox  RUE:[id number] DOB: Jul 29, 1932 DOA: 01/10/2023 PCP: Kirby Funk, MD (Inactive)   Brief Narrative:  HPI: Laurie Fox is a 89 y.o. female with medical history significant of breast cancer s/p lumpectomy and radiation therapy on chronic anastrozole, HTN, HLD, IIDM, multiple OA, presented with mechanical fall and right hip pain.   Patient was walking her dog on Sunday afternoon and dog suddenly dragged the patient, patient fell on her right hip.  Patient came to ED.  Sunday afternoon, trauma scan including right hip negative for fracture or dislocation and patient was given pain medication and sent home.  Over the weekend, patient started to have increasing pain associated with right hip ambulation and unable to put weight on right hip since yesterday because of excruciating pain.  And family brought her back to ED today.  At baseline patient lives alone in a two-story house able to climb 13 steps to upstairs with no shortness of breath or chest pain and she is able to walk her dog for 20-30 minutes without chest pain or shortness of breath.  No history of stroke or CAD, she only takes metformin for her diabetes   ED Course: Afebrile, borderline bradycardia blood pressure elevated nonhypoxic.  Trauma scan including right hip showing displaced right femoral neck fracture, no fracture found on ribs or elbow.  Blood work showed creatinine 1.4 compared to baseline 0.8 about 8 years ago, glucose 216, K4.0  Assessment & Plan:   Principal Problem:   Hip fracture (HCC) Active Problems:   Breast cancer of upper-inner quadrant of right female breast (HCC)   Closed displaced fracture of right femoral neck (HCC)   HTN (hypertension)   Type 2 diabetes mellitus with chronic kidney disease, without long-term current use of insulin (HCC)   Malnutrition of moderate degree  Right femoral neck fracture Secondary to mechanical fall: Orthopedics on board.  Underwent right  total hip arthroplasty by Dr. Linna Caprice 01/12/2023.  Management per orthopedics.  To be seen by PT OT.  Will likely require SNF discharge.  Acute blood loss anemia: Hemoglobin dropped from 11.4 preoperatively to 8.5 postoperatively.  No indication of transfusion.  Monitor daily and transfuse if less than 7.   AKI vs CKD: No recent kidney function baseline reading in the last 8 years.  Currently creatinine improving and 1.1 today.   HTN, uncontrolled: Blood pressure much better controlled.  Continue current regimen.  Type 2 diabetes mellitus: Hemoglobin A1c 6.7 this time.  Takes metformin at home which is on hold.  Continue SSI, blood sugar fairly controlled.  Hyperlipidemia: Continue statin.   History of breast cancer stage I -S/p radiation and lumpectomy -On chronic anastrozole  DVT prophylaxis: SCDs Start: 01/12/23 1215   Code Status: Full Code  Family Communication:  None present at bedside.  Plan of care discussed with patient in length and he/she verbalized understanding and agreed with it.  Status is: Inpatient Remains inpatient appropriate because: Pending PT OT assessment.  Will likely require SNF discharge.   Estimated body mass index is 26.44 kg/m as calculated from the following:   Height as of this encounter: 5\' 3"  (1.6 m).   Weight as of this encounter: 67.7 kg.    Nutritional Assessment: Body mass index is 26.44 kg/m.Marland Kitchen Seen by dietician.  I agree with the assessment and plan as outlined below: Nutrition Status: Nutrition Problem: Moderate Malnutrition Etiology: social / environmental circumstances (inadequate oral intake, pt reports related to decreased appetite and  lack of hunger) Signs/Symptoms: moderate fat depletion, moderate muscle depletion, severe muscle depletion Interventions: Refer to RD note for recommendations  . Skin Assessment: I have examined the patient's skin and I agree with the wound assessment as performed by the wound care RN as outlined  below:    Consultants:  Orthopedics  Procedures:  As above  Antimicrobials:  Anti-infectives (From admission, onward)    Start     Dose/Rate Route Frequency Ordered Stop   01/12/23 1600  ceFAZolin (ANCEF) IVPB 2g/100 mL premix        2 g 200 mL/hr over 30 Minutes Intravenous Every 8 hours 01/12/23 1214 01/12/23 2359   01/12/23 0920  vancomycin (VANCOCIN) powder  Status:  Discontinued          As needed 01/12/23 0928 01/12/23 1000   01/12/23 0742  ceFAZolin (ANCEF) 2-4 GM/100ML-% IVPB       Note to Pharmacy: Kathrene Bongo D: cabinet override      01/12/23 0742 01/12/23 1959         Subjective: Patient seen and examined.  She was slightly sleepy since this was early in the morning.  She had no complaints.  She was oriented.  Objective: Vitals:   01/12/23 2025 01/12/23 2026 01/12/23 2200 01/13/23 0459  BP: (!) 133/97 (!) 133/97  (!) 144/37  Pulse: 79 77  61  Resp: 15 15  15   Temp: 98.6 F (37 C) 98.6 F (37 C)  98.3 F (36.8 C)  TempSrc:      SpO2: 92% (!) 89% 94% 96%  Weight:      Height:        Intake/Output Summary (Last 24 hours) at 01/13/2023 0705 Last data filed at 01/13/2023 0400 Gross per 24 hour  Intake 1661.02 ml  Output 500 ml  Net 1161.02 ml    Filed Weights   01/11/23 1400 01/12/23 0638  Weight: 67.7 kg 67.7 kg    Examination:  General exam: Slightly sleepy. Respiratory system: Clear to auscultation. Respiratory effort normal. Cardiovascular system: S1 & S2 heard, RRR. No JVD, murmurs, rubs, gallops or clicks. No pedal edema. Gastrointestinal system: Abdomen is nondistended, soft and nontender. No organomegaly or masses felt. Normal bowel sounds heard. Central nervous system: Slight sleepy but oriented when awake.  No focal deficit. Extremities: Symmetric 5 x 5 power. Skin: No rashes, lesions or ulcers.   Data Reviewed: I have personally reviewed following labs and imaging studies  CBC: Recent Labs  Lab 01/10/23 1347 01/12/23 0351  01/13/23 0325  WBC 12.6* 9.0 7.8  NEUTROABS 10.9* 7.0  --   HGB 11.4* 10.1* 8.5*  HCT 36.1 31.9* 27.5*  MCV 85.1 81.4 85.4  PLT 299 358 296    Basic Metabolic Panel: Recent Labs  Lab 01/10/23 1347 01/12/23 0351 01/13/23 0325  NA 137 138 138  K 4.0 3.5 3.6  CL 99 99 102  CO2 26 29 25   GLUCOSE 216* 222* 212*  BUN 31* 34* 34*  CREATININE 1.40* 1.10* 1.23*  CALCIUM 9.8 9.3 8.7*    GFR: Estimated Creatinine Clearance: 29.2 mL/min (A) (by C-G formula based on SCr of 1.23 mg/dL (H)). Liver Function Tests: No results for input(s): "AST", "ALT", "ALKPHOS", "BILITOT", "PROT", "ALBUMIN" in the last 168 hours. No results for input(s): "LIPASE", "AMYLASE" in the last 168 hours. No results for input(s): "AMMONIA" in the last 168 hours. Coagulation Profile: No results for input(s): "INR", "PROTIME" in the last 168 hours. Cardiac Enzymes: No results for input(s): "CKTOTAL", "CKMB", "  CKMBINDEX", "TROPONINI" in the last 168 hours. BNP (last 3 results) No results for input(s): "PROBNP" in the last 8760 hours. HbA1C: Recent Labs    01/10/23 1539  HGBA1C 6.7*    CBG: Recent Labs  Lab 01/12/23 0639 01/12/23 1006 01/12/23 1217 01/12/23 1734 01/12/23 2026  GLUCAP 170* 187* 173* 220* 237*    Lipid Profile: No results for input(s): "CHOL", "HDL", "LDLCALC", "TRIG", "CHOLHDL", "LDLDIRECT" in the last 72 hours. Thyroid Function Tests: No results for input(s): "TSH", "T4TOTAL", "FREET4", "T3FREE", "THYROIDAB" in the last 72 hours. Anemia Panel: No results for input(s): "VITAMINB12", "FOLATE", "FERRITIN", "TIBC", "IRON", "RETICCTPCT" in the last 72 hours. Sepsis Labs: No results for input(s): "PROCALCITON", "LATICACIDVEN" in the last 168 hours.  Recent Results (from the past 240 hour(s))  Surgical pcr screen     Status: None   Collection Time: 01/12/23  6:30 AM   Specimen: Nasal Mucosa; Nasal Swab  Result Value Ref Range Status   MRSA, PCR NEGATIVE NEGATIVE Final    Staphylococcus aureus NEGATIVE NEGATIVE Final    Comment: (NOTE) The Xpert SA Assay (FDA approved for NASAL specimens in patients 37 years of age and older), is one component of a comprehensive surveillance program. It is not intended to diagnose infection nor to guide or monitor treatment. Performed at [facility], [address], [place], [place] [address]      Radiology Studies: DG HIP UNILAT WITH PELVIS 1V RIGHT  Result Date: 01/12/2023 CLINICAL DATA:  Fluoroscopic assistance for right hip arthroplasty EXAM: DG HIP (WITH OR WITHOUT PELVIS) 1V RIGHT COMPARISON:  01/10/2023 FINDINGS: Fluoroscopic images show right hip arthroplasty. Fracture is noted in the neck of the right femur in the early images. Fluoroscopic time 12 seconds. Radiation dose 1.09 mGy. IMPRESSION: Fluoroscopic assistance was provided for right hip arthroplasty. Electronically Signed   By: Ernie Avena M.D.   On: 01/12/2023 13:14   DG Pelvis Portable  Result Date: 01/12/2023 CLINICAL DATA:  Status post right hip arthroplasty. EXAM: PORTABLE PELVIS 1-2 VIEWS COMPARISON:  None Available. FINDINGS: Frontal projection of the pelvis demonstrates normal alignment of a bipolar hemiarthroplasty of the right hip. No surrounding fracture or abnormal lucency. The visualized pelvis demonstrates normal appearance. IMPRESSION: Normal alignment of bipolar hemiarthroplasty of the right hip. Electronically Signed   By: Irish Lack M.D.   On: 01/12/2023 11:48   DG C-Arm 1-60 Min-No Report  Result Date: 01/12/2023 Fluoroscopy was utilized by the requesting physician.  No radiographic interpretation.   ECHOCARDIOGRAM COMPLETE  Result Date: 01/11/2023    ECHOCARDIOGRAM REPORT   Patient Name:   JOLEAH YEN Date of Exam: 01/11/2023 Medical Rec #:  [id number]        Height:       63.5 in Accession #:    [id number]       Weight:       182.1 lb Date of Birth:  1932/07/15        BSA:          1.869 m Patient Age:    89 years          BP:           198/83 mmHg Patient Gender: F                HR:           58 bpm. Exam Location:  Inpatient Procedure: 2D Echo, Cardiac Doppler and Color Doppler Indications:    Pre-Op  History:  Patient has no prior history of Echocardiogram examinations.                 Risk Factors:Hypertension and Diabetes.  Sonographer:    Darlys Gales Referring Phys: [id number] PING T ZHANG IMPRESSIONS  1. Left ventricular ejection fraction, by estimation, is 50 to 55%. The left ventricle has low normal function. The left ventricle has no regional wall motion abnormalities. There is mild concentric left ventricular hypertrophy. Left ventricular diastolic parameters are consistent with Grade I diastolic dysfunction (impaired relaxation).  2. Right ventricular systolic function is normal. The right ventricular size is normal. There is moderately elevated pulmonary artery systolic pressure. The estimated right ventricular systolic pressure is 46.2 mmHg.  3. Left atrial size was moderately dilated.  4. The mitral valve is normal in structure. Mild mitral valve regurgitation. No evidence of mitral stenosis. Moderate to severe mitral annular calcification.  5. The aortic valve is tricuspid. There is mild calcification of the aortic valve. Aortic valve regurgitation is mild. Aortic valve sclerosis/calcification is present, without any evidence of aortic stenosis.  6. The inferior vena cava is normal in size with greater than 50% respiratory variability, suggesting right atrial pressure of 3 mmHg. FINDINGS  Left Ventricle: Left ventricular ejection fraction, by estimation, is 50 to 55%. The left ventricle has low normal function. The left ventricle has no regional wall motion abnormalities. The left ventricular internal cavity size was normal in size. There is mild concentric left ventricular hypertrophy. Left ventricular diastolic parameters are consistent with Grade I diastolic dysfunction (impaired relaxation). Right Ventricle:  The right ventricular size is normal. No increase in right ventricular wall thickness. Right ventricular systolic function is normal. There is moderately elevated pulmonary artery systolic pressure. The tricuspid regurgitant velocity is 3.21 m/s, and with an assumed right atrial pressure of 5 mmHg, the estimated right ventricular systolic pressure is 46.2 mmHg. Left Atrium: Left atrial size was moderately dilated. Right Atrium: Right atrial size was normal in size. Pericardium: There is no evidence of pericardial effusion. Mitral Valve: The mitral valve is normal in structure. Moderate to severe mitral annular calcification. Mild mitral valve regurgitation. No evidence of mitral valve stenosis. MV peak gradient, 6.2 mmHg. The mean mitral valve gradient is 2.0 mmHg. Tricuspid Valve: The tricuspid valve is normal in structure. Tricuspid valve regurgitation is mild . No evidence of tricuspid stenosis. Aortic Valve: The aortic valve is tricuspid. There is mild calcification of the aortic valve. Aortic valve regurgitation is mild. Aortic valve sclerosis/calcification is present, without any evidence of aortic stenosis. Aortic valve mean gradient measures 3.0 mmHg. Aortic valve peak gradient measures 7.7 mmHg. Aortic valve area, by VTI measures 2.37 cm. Pulmonic Valve: The pulmonic valve was grossly normal. Pulmonic valve regurgitation is not visualized. No evidence of pulmonic stenosis. Aorta: The aortic root is normal in size and structure. Venous: The inferior vena cava is normal in size with greater than 50% respiratory variability, suggesting right atrial pressure of 3 mmHg. IAS/Shunts: No atrial level shunt detected by color flow Doppler.  LEFT VENTRICLE PLAX 2D LVIDd:         4.90 cm   Diastology LVIDs:         3.70 cm   LV e' medial:    3.92 cm/s LV PW:         1.20 cm   LV E/e' medial:  17.0 LV IVS:        1.30 cm   LV e' lateral:   4.46 cm/s LVOT  diam:     1.80 cm   LV E/e' lateral: 14.9 LV SV:         68 LV SV  Index:   36 LVOT Area:     2.54 cm  RIGHT VENTRICLE             IVC RV S prime:     12.10 cm/s  IVC diam: 1.90 cm TAPSE (M-mode): 2.3 cm LEFT ATRIUM             Index        RIGHT ATRIUM           Index LA Vol (A2C):   78.2 ml 41.84 ml/m  RA Area:     12.30 cm LA Vol (A4C):   70.2 ml 37.56 ml/m  RA Volume:   25.70 ml  13.75 ml/m LA Biplane Vol: 74.4 ml 39.81 ml/m  AORTIC VALVE AV Area (Vmax):    2.29 cm AV Area (Vmean):   2.66 cm AV Area (VTI):     2.37 cm AV Vmax:           139.00 cm/s AV Vmean:          86.400 cm/s AV VTI:            0.288 m AV Peak Grad:      7.7 mmHg AV Mean Grad:      3.0 mmHg LVOT Vmax:         125.00 cm/s LVOT Vmean:        90.400 cm/s LVOT VTI:          0.268 m LVOT/AV VTI ratio: 0.93  AORTA Ao Root diam: 2.70 cm Ao Asc diam:  3.40 cm MITRAL VALVE                TRICUSPID VALVE MV Area (PHT): 1.82 cm     TR Peak grad:   41.2 mmHg MV Area VTI:   1.63 cm     TR Vmax:        321.00 cm/s MV Peak grad:  6.2 mmHg MV Mean grad:  2.0 mmHg     SHUNTS MV Vmax:       1.25 m/s     Systemic VTI:  0.27 m MV Vmean:      67.2 cm/s    Systemic Diam: 1.80 cm MV Decel Time: 416 msec MV E velocity: 66.60 cm/s MV A velocity: 122.00 cm/s MV E/A ratio:  0.55 Arvilla Meres MD Electronically signed by Arvilla Meres MD Signature Date/Time: 01/11/2023/9:10:24 AM    Final     Scheduled Meds:  aspirin  81 mg Oral BID   carvedilol  25 mg Oral BID WC   docusate sodium  100 mg Oral BID   feeding supplement  237 mL Oral BID BM   irbesartan  150 mg Oral Daily   And   hydrochlorothiazide  12.5 mg Oral Daily   insulin aspart  0-9 Units Subcutaneous TID WC   melatonin  3 mg Oral QHS   multivitamin with minerals  1 tablet Oral Daily   pantoprazole  40 mg Oral Daily   senna  1 tablet Oral BID   simvastatin  20 mg Oral q1800   Continuous Infusions:  sodium chloride 100 mL/hr at 01/12/23 2000   methocarbamol (ROBAXIN) IV       LOS: 3 days   Hughie Closs, MD [facility]  01/13/2023,  7:05 AM   *Please note that this is a verbal dictation  therefore any spelling or grammatical errors are due to the "[place] [employer]" system interpretation.  Please page via [place] and do not message via secure chat for urgent patient care matters. Secure chat can be used for non urgent patient care matters.  How to contact the [facility] Attending or Consulting provider [place] or covering provider during after hours [place], for this patient?  Check the care team in [facility] and look for a) attending/consulting TR[facility] provider listed and b) the [facility] team listed. Page or secure chat [phone]. Log into [email or web address] and use [facility]'s universal password to access. If you do not have the password, please contact the hospital operator. Locate the [facility] provider you are looking for under [facility] and page to a number that you can be directly reached. If you still have difficulty reaching the provider, please page the [facility] (Director on Call) for the Hospitalists listed on amion for assistance.

## 2023-01-14 ENCOUNTER — Encounter (HOSPITAL_COMMUNITY): Payer: Self-pay | Admitting: Orthopedic Surgery

## 2023-01-14 DIAGNOSIS — S72001A Fracture of unspecified part of neck of right femur, initial encounter for closed fracture: Secondary | ICD-10-CM | POA: Diagnosis not present

## 2023-01-14 LAB — CBC
HCT: 27.1 % — ABNORMAL LOW (ref 36.0–46.0)
Hemoglobin: 8.4 g/dL — ABNORMAL LOW (ref 12.0–15.0)
MCH: 26.3 pg (ref 26.0–34.0)
MCHC: 31 g/dL (ref 30.0–36.0)
MCV: 85 fL (ref 80.0–100.0)
Platelets: 328 10*3/uL (ref 150–400)
RBC: 3.19 MIL/uL — ABNORMAL LOW (ref 3.87–5.11)
RDW: 19.5 % — ABNORMAL HIGH (ref 11.5–15.5)
WBC: 8.6 10*3/uL (ref 4.0–10.5)
nRBC: 0 % (ref 0.0–0.2)

## 2023-01-14 LAB — GLUCOSE, CAPILLARY
Glucose-Capillary: 158 mg/dL — ABNORMAL HIGH (ref 70–99)
Glucose-Capillary: 148 mg/dL — ABNORMAL HIGH (ref 70–99)
Glucose-Capillary: 223 mg/dL — ABNORMAL HIGH (ref 70–99)
Glucose-Capillary: 200 mg/dL — ABNORMAL HIGH (ref 70–99)

## 2023-01-14 MED ORDER — OMEPRAZOLE 20 MG PO CPDR: 20.0000 mg | DELAYED_RELEASE_CAPSULE | Freq: Every day | ORAL | 0 refills | Status: DC

## 2023-01-14 MED ORDER — HYDROCODONE-ACETAMINOPHEN 5-325 MG PO TABS: 1.0000 | ORAL_TABLET | ORAL | 0 refills | Status: AC | PRN

## 2023-01-14 MED ORDER — ASPIRIN 81 MG PO CHEW: 81.0000 mg | CHEWABLE_TABLET | Freq: Two times a day (BID) | ORAL | 0 refills | Status: AC

## 2023-01-14 MED ORDER — MELATONIN 3 MG PO TABS: 3.0000 mg | ORAL_TABLET | Freq: Every day | ORAL | 0 refills | Status: AC

## 2023-01-14 NOTE — Progress Notes (Signed)
Physical Therapy Treatment Patient Details Name: Laurie Fox MRN: [id number] DOB: [birth year] Today's Date: 01/14/2023   History of Present Illness Pt is 89 yo female who presented on 01/10/23 with R hip pain following being pulled down by her dog. Pt had femoral neck fx and underwent  anterior THA on 01/12/23. PMH: DM2, GERD, HTN, breast cancer    PT Comments    Pt seen for PT tx with pt agreeable, daughter exiting room. Pt pleasant but presents with some slow processing, decreased initiation. Pt performs LE strengthening exercises with AAROM PRN & cuing for technique. Pt requires min<>mod assist for bed mobility, mod assist for transfers, & attempts gait but is limited by pain & fatigue. Continue to recommend ongoing PT services to address deficits & increase independence with mobility.    Recommendations for follow up therapy are one component of a multi-disciplinary discharge planning process, led by the attending physician.  Recommendations may be updated based on patient status, additional functional criteria and insurance authorization.  Follow Up Recommendations  Can patient physically be transported by private vehicle: Yes    Assistance Recommended at Discharge Frequent or constant Supervision/Assistance  Patient can return home with the following A lot of help with walking and/or transfers;A lot of help with bathing/dressing/bathroom;Assistance with cooking/housework;Assist for transportation;Help with stairs or ramp for entrance   Equipment Recommendations  None recommended by PT (TBD in next venue)    Recommendations for Other Services       Precautions / Restrictions Precautions Precautions: Fall Precaution Comments: no hip prec. Restrictions Weight Bearing Restrictions: Yes RLE Weight Bearing: Weight bearing as tolerated     Mobility  Bed Mobility Overal bed mobility: Needs Assistance Bed Mobility: Supine to Sit     Supine to sit: Min assist, Mod assist      General bed mobility comments: Assistance to move RLE to EOB, cuing to use bed rails to upright trunk with HOB elevated, extra time required.    Transfers Overall transfer level: Needs assistance Equipment used: Rolling walker (2 wheels) Transfers: Sit to/from Stand Sit to Stand: Mod assist   Step pivot transfers: Mod assist       General transfer comment: STS from elevated EOB, recliner with mod assist, cuing for hand placement to push to standing, extra time to power up & transition hands to RW. Pt is able to take ~2 steps to step pivot bed>recliner with mod assist, extra time to weight shift L<>R & advance either foot, increased pain with weight bearing RLE, cuing for RW management.    Ambulation/Gait Ambulation/Gait assistance: Mod assist Gait Distance (Feet): 1 Feet Assistive device: Rolling walker (2 wheels)   Gait velocity: decreased     General Gait Details: Pt attempts to take steps forward but only successful x 1 step before fatiguing & leaning over more on RW. Decreased weight shift L<>R, decreased ability to weight bear through RLE to advance LLE.   Stairs             Wheelchair Mobility    Modified Rankin (Stroke Patients Only)       Balance Overall balance assessment: Needs assistance, History of Falls Sitting-balance support: Feet supported, Bilateral upper extremity supported Sitting balance-Leahy Scale: Fair Sitting balance - Comments: close supervision static sitting EOB   Standing balance support: Reliant on assistive device for balance, During functional activity, Bilateral upper extremity supported Standing balance-Leahy Scale: Poor  Cognition Arousal/Alertness: Awake/alert Behavior During Therapy: WFL for tasks assessed/performed Overall Cognitive Status: Impaired/Different from baseline Area of Impairment: Memory, Problem solving                             Problem Solving:  Decreased initiation, Slow processing, Requires verbal cues, Requires tactile cues          Exercises General Exercises - Lower Extremity Ankle Circles/Pumps: AROM, Supine, Both, 10 reps Long Arc Quad: AROM, Seated, Strengthening, Right, 10 reps Heel Slides: AROM, Supine, AAROM, Strengthening, Both, 10 reps (AAROM RLE) Hip ABduction/ADduction: AROM, Supine, AAROM, Strengthening, Both, 10 reps (ABduction BLE) Straight Leg Raises: AROM, AAROM, Supine, Strengthening, Both, 10 reps (AAROM RLE)    General Comments General comments (skin integrity, edema, etc.): Pt on room air, SpO2 >90% throughout.      Pertinent Vitals/Pain Pain Assessment Pain Assessment: 0-10 Pain Score: 6  Pain Location: R hip Pain Descriptors / Indicators: Grimacing, Guarding, Discomfort Pain Intervention(s): Limited activity within patient's tolerance, Monitored during session, Repositioned    Home Living                          Prior Function            PT Goals (current goals can now be found in the care plan section) Acute Rehab PT Goals Patient Stated Goal: return home to dog, "Mocha" PT Goal Formulation: With patient Time For Goal Achievement: 01/26/23 Potential to Achieve Goals: Fair Progress towards PT goals: Progressing toward goals    Frequency    Min 5X/week      PT Plan Current plan remains appropriate    Co-evaluation              AM-PAC PT "6 Clicks" Mobility   Outcome Measure  Help needed turning from your back to your side while in a flat bed without using bedrails?: A Little Help needed moving from lying on your back to sitting on the side of a flat bed without using bedrails?: A Lot Help needed moving to and from a bed to a chair (including a wheelchair)?: A Lot Help needed standing up from a chair using your arms (e.g., wheelchair or bedside chair)?: A Lot Help needed to walk in hospital room?: A Lot Help needed climbing 3-5 steps with a railing? :  Total 6 Click Score: 12    End of Session Equipment Utilized During Treatment: Gait belt Activity Tolerance: Patient tolerated treatment well;Patient limited by fatigue;Patient limited by pain Patient left: in chair;with chair alarm set;with call bell/phone within reach Nurse Communication: Mobility status PT Visit Diagnosis: Unsteadiness on feet (R26.81);Muscle weakness (generalized) (M62.81);Pain;Difficulty in walking, not elsewhere classified (R26.2) Pain - Right/Left: Right Pain - part of body: Hip     Time: 1478-2956 PT Time Calculation (min) (ACUTE ONLY): 27 min  Charges:  $Therapeutic Exercise: 8-22 mins $Therapeutic Activity: 8-22 mins                     Aleda Grana, PT, DPT 01/14/23, 12:19 PM  Sandi Mariscal 01/14/2023, 12:17 PM

## 2023-01-14 NOTE — TOC Progression Note (Addendum)
Transition of Care [facility]) - Progression Note    Patient Details  Name: DEARRA MYHAND MRN: [id number] Date of Birth: 01-18-1932  Transition of Care [facility]) CM/SW Contact  Aarit Kashuba A [place], [place] Phone Number: 01/14/2023, 12:03 PM  Clinical Narrative:     CSW contacted pt's daughter, Chales Abrahams to update her on bed availability at her choice of [facility]. CSW informed her that facility bed would not be available until Wednesday. CSW left voicemail with contact information to reach back out to CSW.   TOC will continue to follow.   CSW met with pt and pt's daughter, Chales Abrahams  at bedside and CSW provided bed offers. She stated she would look over offers and follow up with CSW.   Chales Abrahams followed up with CSW and stated pt made choice to [facility].   CSW will follow up with facility regarding bed availability and start authorization as pt is stable for discharge.   TOC will continue to follow.   Expected Discharge Plan: Skilled Nursing Facility Barriers to Discharge: Continued Medical Work up  Expected Discharge Plan and Services In-house Referral: Clinical Social Work Discharge Planning Services: CM Consult Post Acute Care Choice: Skilled Nursing Facility Living arrangements for the past 2 months: Single Family Home                                       Social Determinants of Health (SDOH) Interventions SDOH Screenings   Tobacco Use: Unknown (01/12/2023)    Readmission Risk Interventions    01/11/2023    2:10 PM  Readmission Risk Prevention Plan  Transportation Screening Complete  PCP or Specialist Appt within 5-7 Days Complete  Home Care Screening Complete  Medication Review (RN CM) Complete

## 2023-01-14 NOTE — Care Management Important Message (Signed)
Important Message  Patient Details  Name: Laurie Fox MRN: [id number] Date of Birth: [birth year]   Medicare Important Message Given:  Yes     Sherilyn Banker 01/14/2023, 3:39 PM

## 2023-01-14 NOTE — Evaluation (Signed)
Occupational Therapy Evaluation Patient Details Name: Laurie Fox MRN: [id number] DOB: August 08, 1931 Today's Date: 01/14/2023   History of Present Illness Pt is 89 yo female who presented on 01/10/23 with R hip pain following being pulled down by her dog. Pt had femoral neck fx and underwent  anterior THA on 01/12/23. PMH: DM2, GERD, HTN, breast cancer   Clinical Impression   PTA, pt lives alone, typically Modified Independent with ADLs, light household IADLs and mobility using RW. Pt presents now with primary deficit being R hip pain as well as deficits in standing balance, endurance and cognition. Attempted to stand from recliner with RW though pt unable to safely complete (kept scooting forward rather than standing straight up). Trialed Stedy with pt able to stand with Mod A - would recommend nursing staff use this for [facility] transfers if pain levels high. Overall, pt requires Min A for UB ADL and Max-Total for LB ADLs. Recommend continued inpatient follow up therapy, <3 hours/day       Recommendations for follow up therapy are one component of a multi-disciplinary discharge planning process, led by the attending physician.  Recommendations may be updated based on patient status, additional functional criteria and insurance authorization.   Assistance Recommended at Discharge Frequent or constant Supervision/Assistance  Patient can return home with the following Two people to help with walking and/or transfers;Two people to help with bathing/dressing/bathroom    Functional Status Assessment  Patient has had a recent decline in their functional status and demonstrates the ability to make significant improvements in function in a reasonable and predictable amount of time.  Equipment Recommendations  BSC/3in1    Recommendations for Other Services       Precautions / Restrictions Precautions Precautions: Fall Precaution Comments: no hip prec. Restrictions Weight Bearing Restrictions:  Yes RLE Weight Bearing: Weight bearing as tolerated      Mobility Bed Mobility               General bed mobility comments: in chair on entry    Transfers Overall transfer level: Needs assistance Equipment used: Rolling walker (2 wheels), Ambulation equipment used Transfers: Sit to/from Stand Sit to Stand: Max assist, Mod assist           General transfer comment: Max A for attempting to stand from recliner - unable to fully acheive from low surface. Trialed Stedy with light Mod A to stand upright      Balance Overall balance assessment: Needs assistance, History of Falls Sitting-balance support: Feet supported, Bilateral upper extremity supported Sitting balance-Leahy Scale: Fair     Standing balance support: Reliant on assistive device for balance, During functional activity, Bilateral upper extremity supported Standing balance-Leahy Scale: Poor                             ADL either performed or assessed with clinical judgement   ADL Overall ADL's : Needs assistance/impaired Eating/Feeding: Set up   Grooming: Set up;Sitting   Upper Body Bathing: Minimal assistance;Sitting   Lower Body Bathing: Maximal assistance;Sit to/from stand   Upper Body Dressing : Minimal assistance;Sitting   Lower Body Dressing: Maximal assistance;Sit to/from stand       Toileting- Architect and Hygiene: Total assistance;Sit to/from stand;Sitting/lateral lean         General ADL Comments: LImited by hip pain impairing standing abilities for LB ADLs; at high risk for further falls     Vision Baseline Vision/History: 1 Wears  glasses Ability to See in Adequate Light: 0 Adequate Patient Visual Report: No change from baseline Vision Assessment?: No apparent visual deficits     Perception     Praxis      Pertinent Vitals/Pain Pain Assessment Pain Assessment: Faces Faces Pain Scale: Hurts even more Pain Location: R hip Pain Descriptors /  Indicators: Grimacing, Guarding, Discomfort Pain Intervention(s): Monitored during session, Premedicated before session, Ice applied     Hand Dominance Right   Extremity/Trunk Assessment Upper Extremity Assessment Upper Extremity Assessment: Generalized weakness   Lower Extremity Assessment Lower Extremity Assessment: Defer to PT evaluation   Cervical / Trunk Assessment Cervical / Trunk Assessment: Kyphotic   Communication Communication Communication: No difficulties   Cognition Arousal/Alertness: Awake/alert Behavior During Therapy: WFL for tasks assessed/performed Overall Cognitive Status: Impaired/Different from baseline Area of Impairment: Memory, Problem solving                     Memory: Decreased short-term memory       Problem Solving: Decreased initiation, Slow processing, Requires verbal cues, Requires tactile cues General Comments: pt has been mildly confused since surgery but able to give detailed history from before surgery; follows one step commands with multimodal cues, cues needed for safety w/ DME use     General Comments  Pt on room air, SpO2 >90% throughout.    Exercises General Exercises - Lower Extremity Straight Leg Raises:  (AAROM RLE)   Shoulder Instructions      Home Living Family/patient expects to be discharged to:: Private residence Living Arrangements: Alone Available Help at Discharge: Family;Available PRN/intermittently Type of Home: House Home Access: Stairs to enter [employer] of Steps: 6 Entrance Stairs-Rails: Right Home Layout: Two level;1/2 bath on main level Alternate Level Stairs-Number of Steps: 15             Home Equipment: Rolling Walker (2 wheels)          Prior Functioning/Environment Prior Level of Function : Independent/Modified Independent             Mobility Comments: RW for mobility ADLs Comments: able to manage ADLs, household IADLs, caring for small dog. Reports stopping  driving 3 weeks ago; uses a taxi driver to get to/from grocery store and they help bring in her groceries        OT Problem List: Decreased strength;Decreased activity tolerance;Impaired balance (sitting and/or standing);Decreased cognition;Decreased safety awareness;Decreased knowledge of use of DME or AE;Pain      OT Treatment/Interventions: Therapeutic exercise;Self-care/ADL training;DME and/or AE instruction;Energy conservation;Therapeutic activities;Patient/family education;Balance training    OT Goals(Current goals can be found in the care plan section) Acute Rehab OT Goals Patient Stated Goal: get back home to dog OT Goal Formulation: With patient Time For Goal Achievement: 01/28/23 Potential to Achieve Goals: Good  OT Frequency: Min 2X/week    Co-evaluation              AM-PAC OT "6 Clicks" Daily Activity     Outcome Measure Help from another person eating meals?: None Help from another person taking care of personal grooming?: A Little Help from another person toileting, which includes using toliet, bedpan, or urinal?: Total Help from another person bathing (including washing, rinsing, drying)?: A Lot Help from another person to put on and taking off regular upper body clothing?: A Little Help from another person to put on and taking off regular lower body clothing?: A Lot 6 Click Score: 15   End of Session Equipment Utilized  During Treatment: Rolling walker (2 wheels) Nurse Communication: Mobility status  Activity Tolerance: Patient limited by pain Patient left: in chair;with call bell/phone within reach;with chair alarm set  OT Visit Diagnosis: Unsteadiness on feet (R26.81);Other abnormalities of gait and mobility (R26.89);Muscle weakness (generalized) (M62.81)                Time: 1308-6578 OT Time Calculation (min): 33 min Charges:  OT General Charges $OT Visit: 1 Visit OT Evaluation $OT Eval Moderate Complexity: 1 Mod OT Treatments $Self Care/Home  Management : 8-22 mins  Bradd Canary, OTR/L Acute Rehab Services Office: [phone]   Lorre Munroe 01/14/2023, 1:00 PM

## 2023-01-14 NOTE — Progress Notes (Signed)
    Subjective:  Patient reports pain as mild to moderate.  Denies N/V/CP/SOB/Abd pain. She reports soreness in her right thigh. She denies any tingling or numbness in LE bilaterally.   Objective:   VITALS:   Vitals:   01/13/23 0830 01/13/23 1734 01/13/23 2028 01/14/23 0500  BP: 102/75 118/71 (!) 161/49 (!) 150/68  Pulse: 68 63 (!) 59 72  Resp: 16 15 16 16   Temp: 97.8 F (36.6 C) 99.3 F (37.4 C) 98.4 F (36.9 C) 98.3 F (36.8 C)  TempSrc:  Oral Oral Oral  SpO2: 99% 96% 98%   Weight:      Height:        Patient sitting up in bed. NAD.  Neurologically intact ABD soft Neurovascular intact Sensation intact distally Intact pulses distally Dorsiflexion/Plantar flexion intact Incision: dressing C/D/I No cellulitis present Compartment soft   Lab Results  Component Value Date   WBC 7.8 01/13/2023   HGB 8.5 (L) 01/13/2023   HCT 27.5 (L) 01/13/2023   MCV 85.4 01/13/2023   PLT 296 01/13/2023   BMET    Component Value Date/Time   NA 138 01/13/2023 0325   NA 144 11/30/2014 1504   K 3.6 01/13/2023 0325   K 4.0 11/30/2014 1504   CL 102 01/13/2023 0325   CO2 25 01/13/2023 0325   CO2 24 11/30/2014 1504   GLUCOSE 212 (H) 01/13/2023 0325   GLUCOSE 119 11/30/2014 1504   BUN 34 (H) 01/13/2023 0325   BUN 18.9 11/30/2014 1504   CREATININE 1.23 (H) 01/13/2023 0325   CREATININE 0.8 11/30/2014 1504   CALCIUM 8.7 (L) 01/13/2023 0325   CALCIUM 10.0 11/30/2014 1504   EGFR 70 (L) 11/30/2014 1504   GFRNONAA 42 (L) 01/13/2023 0325     Assessment/Plan: 2 Days Post-Op   Principal Problem:   Hip fracture (HCC) Active Problems:   Breast cancer of upper-inner quadrant of right female breast (HCC)   Closed displaced fracture of right femoral neck (HCC)   HTN (hypertension)   Type 2 diabetes mellitus with chronic kidney disease, without long-term current use of insulin (HCC)   Malnutrition of moderate degree   WBAT with walker DVT ppx: Aspirin, SCDs, TEDS PO pain  control PT/OT: Patient has not ambulated with PT yet. Continue PT today.  Dispo: Patient under care of the medical team. Disposition per their recommendation. Discussion about d/c to SNF. Pain medication and DVT ppx printed in chart.    Clois Dupes, PA-C 01/14/2023, 7:44 AM   [facility]  [place] [address], [address], [place], [place] [address] Phone: [phone] [email or web address] Facebook  [employer]

## 2023-01-14 NOTE — Discharge Summary (Signed)
Physician Discharge Summary  PEARLETTE SYLVESTER ZOX:[id number] DOB: Aug 18, 1931 DOA: 01/10/2023  PCP: Kirby Funk, MD (Inactive)  Admit date: 01/10/2023 Discharge date: 01/15/2023 30 Day Unplanned Readmission Risk Score    Flowsheet Row ED to Hosp-Admission (Current) from 01/10/2023 in [place] [facility]  30 Day Unplanned Readmission Risk Score (%) 21.25 Filed at 01/14/2023 0401       This score is the patient's risk of an unplanned readmission within 30 days of being discharged (0 -100%). The score is based on dignosis, age, lab data, medications, orders, and past utilization.   Low:  0-14.9   Medium: 15-21.9   High: 22-29.9   Extreme: 30 and above          Admitted From: Home Disposition: SNF  Recommendations for Outpatient Follow-up:  Follow up with PCP in 1-2 weeks Please obtain BMP/CBC in one week Follow-up with orthopedics in 2 weeks Please follow up with your PCP on the following pending results: Unresulted Labs (From admission, onward)     Start     Ordered   01/10/23 1637  Sodium, urine, random  Once,   R        01/10/23 1636              Home Health: None Equipment/Devices: None  Discharge Condition: Stable CODE STATUS: Full code Diet recommendation: Cardiac  Subjective: Seen and examined no new complaint other than right hip pain.  Feeling better overall compared to yesterday.  Brief/Interim Summary: KATRICIA SADLON is a 89 y.o. female with medical history significant of breast cancer s/p lumpectomy and radiation therapy on chronic anastrozole, HTN, HLD, IIDM, multiple OA, presented with mechanical fall and right hip pain.  Medically stable upon presentation to ED.  Was diagnosed with right femoral neck fracture.  Admitted under hospitalist, orthopedics consulted. Underwent right total hip arthroplasty by Dr. Linna Caprice 01/12/2023.  Management per orthopedics.  To be seen by PT OT.  SNF recommended.  Patient is stable and is going to be discharged to SNF  today.  Orthopedics has prescribed and recommended aspirin 81 mg twice daily for DVT prophylaxis.   Acute blood loss anemia: Hemoglobin dropped from 11.4 preoperatively to 8.1 postoperatively.  No indication of transfusion.     AKI, CKD ruled out: Patient presented with creatinine of 1.4.  Last known creatinine in the chart was from 8 years ago which was normal.  Since it was not known whether this was AKI or CKD.  We have followed her labs and her creatinine is within normal range today.  This was likely AKI which has resolved.   HTN: Slightly elevated likely due to pain.  Continue current regimen.   Type 2 diabetes mellitus: Hemoglobin A1c 6.7 this time.  Resume metformin.   Hyperlipidemia: Continue statin.   History of breast cancer stage I -S/p radiation and lumpectomy -On chronic anastrozole  Discharge plan was discussed with patient and/or family member and they verbalized understanding and agreed with it.  Discharge Diagnoses:  Principal Problem:   Hip fracture (HCC) Active Problems:   Breast cancer of upper-inner quadrant of right female breast (HCC)   Closed displaced fracture of right femoral neck (HCC)   HTN (hypertension)   Type 2 diabetes mellitus with chronic kidney disease, without long-term current use of insulin (HCC)   Malnutrition of moderate degree    Discharge Instructions   Allergies as of 01/15/2023       Reactions   Codeine Nausea And Vomiting  Sulfa Antibiotics Rash        Medication List     STOP taking these medications    aspirin 81 MG tablet Replaced by: aspirin 81 MG chewable tablet       TAKE these medications    acetaminophen 500 MG tablet Commonly known as: TYLENOL Take 1,000 mg by mouth every 8 (eight) hours as needed for moderate pain, fever or headache.   anastrozole 1 MG tablet Commonly known as: ARIMIDEX TAKE 1 TABLET BY MOUTH  DAILY   ASCORBIC ACID PO Take 1 tablet by mouth daily. Vitamin C, unknown strength.    aspirin 81 MG chewable tablet Commonly known as: Aspirin Childrens Chew 1 tablet (81 mg total) by mouth 2 (two) times daily with a meal. Replaces: aspirin 81 MG tablet   carvedilol 25 MG tablet Commonly known as: COREG Take 25 mg by mouth 2 (two) times daily with a meal.   furosemide 40 MG tablet Commonly known as: LASIX Take 40 mg by mouth daily.   HYDROcodone-acetaminophen 5-325 MG tablet Commonly known as: NORCO/VICODIN Take 1 tablet by mouth every 4 (four) hours as needed for up to 7 days for moderate pain or severe pain.   melatonin 3 MG Tabs tablet Take 1 tablet (3 mg total) by mouth at bedtime. What changed:  medication strength how much to take when to take this additional instructions   omeprazole 20 MG capsule Commonly known as: PRILOSEC Take 1 capsule (20 mg total) by mouth daily.   One-A-Day Womens 50+ Tabs Take 1 tablet by mouth daily in the afternoon.   potassium chloride 10 MEQ tablet Commonly known as: KLOR-CON Take 10 mEq by mouth daily.   PREVACID 24HR PO Take 1 capsule by mouth daily.   simvastatin 20 MG tablet Commonly known as: ZOCOR Take 20 mg by mouth daily.   telmisartan-hydrochlorothiazide 40-12.5 MG tablet Commonly known as: MICARDIS HCT Take 1 tablet by mouth daily.        Follow-up Information     Kirby Funk, MD Follow up in 1 week(s).   Specialty: Internal Medicine Contact information: [address], [address] [place] [address] [phone]                Allergies  Allergen Reactions   Codeine Nausea And Vomiting   Sulfa Antibiotics Rash    Consultations: Orthopedics   Procedures/Studies: DG HIP UNILAT WITH PELVIS 1V RIGHT  Result Date: 01/12/2023 CLINICAL DATA:  Fluoroscopic assistance for right hip arthroplasty EXAM: DG HIP (WITH OR WITHOUT PELVIS) 1V RIGHT COMPARISON:  01/10/2023 FINDINGS: Fluoroscopic images show right hip arthroplasty. Fracture is noted in the neck of the right femur in  the early images. Fluoroscopic time 12 seconds. Radiation dose 1.09 mGy. IMPRESSION: Fluoroscopic assistance was provided for right hip arthroplasty. Electronically Signed   By: Ernie Avena M.D.   On: 01/12/2023 13:14   DG Pelvis Portable  Result Date: 01/12/2023 CLINICAL DATA:  Status post right hip arthroplasty. EXAM: PORTABLE PELVIS 1-2 VIEWS COMPARISON:  None Available. FINDINGS: Frontal projection of the pelvis demonstrates normal alignment of a bipolar hemiarthroplasty of the right hip. No surrounding fracture or abnormal lucency. The visualized pelvis demonstrates normal appearance. IMPRESSION: Normal alignment of bipolar hemiarthroplasty of the right hip. Electronically Signed   By: Irish Lack M.D.   On: 01/12/2023 11:48   DG C-Arm 1-60 Min-No Report  Result Date: 01/12/2023 Fluoroscopy was utilized by the requesting physician.  No radiographic interpretation.   ECHOCARDIOGRAM COMPLETE  Result Date: 01/11/2023    ECHOCARDIOGRAM REPORT   Patient Name:   LAKEIRA ALBELO Date of Exam: 01/11/2023 Medical Rec #:  [id number]        Height:       63.5 in Accession #:    [id number]       Weight:       182.1 lb Date of Birth:  07/29/1932        BSA:          1.869 m Patient Age:    89 years         BP:           198/83 mmHg Patient Gender: F                HR:           58 bpm. Exam Location:  Inpatient Procedure: 2D Echo, Cardiac Doppler and Color Doppler Indications:    Pre-Op  History:        Patient has no prior history of Echocardiogram examinations.                 Risk Factors:Hypertension and Diabetes.  Sonographer:    Darlys Gales Referring Phys: [id number] PING T ZHANG IMPRESSIONS  1. Left ventricular ejection fraction, by estimation, is 50 to 55%. The left ventricle has low normal function. The left ventricle has no regional wall motion abnormalities. There is mild concentric left ventricular hypertrophy. Left ventricular diastolic parameters are consistent with Grade I diastolic  dysfunction (impaired relaxation).  2. Right ventricular systolic function is normal. The right ventricular size is normal. There is moderately elevated pulmonary artery systolic pressure. The estimated right ventricular systolic pressure is 46.2 mmHg.  3. Left atrial size was moderately dilated.  4. The mitral valve is normal in structure. Mild mitral valve regurgitation. No evidence of mitral stenosis. Moderate to severe mitral annular calcification.  5. The aortic valve is tricuspid. There is mild calcification of the aortic valve. Aortic valve regurgitation is mild. Aortic valve sclerosis/calcification is present, without any evidence of aortic stenosis.  6. The inferior vena cava is normal in size with greater than 50% respiratory variability, suggesting right atrial pressure of 3 mmHg. FINDINGS  Left Ventricle: Left ventricular ejection fraction, by estimation, is 50 to 55%. The left ventricle has low normal function. The left ventricle has no regional wall motion abnormalities. The left ventricular internal cavity size was normal in size. There is mild concentric left ventricular hypertrophy. Left ventricular diastolic parameters are consistent with Grade I diastolic dysfunction (impaired relaxation). Right Ventricle: The right ventricular size is normal. No increase in right ventricular wall thickness. Right ventricular systolic function is normal. There is moderately elevated pulmonary artery systolic pressure. The tricuspid regurgitant velocity is 3.21 m/s, and with an assumed right atrial pressure of 5 mmHg, the estimated right ventricular systolic pressure is 46.2 mmHg. Left Atrium: Left atrial size was moderately dilated. Right Atrium: Right atrial size was normal in size. Pericardium: There is no evidence of pericardial effusion. Mitral Valve: The mitral valve is normal in structure. Moderate to severe mitral annular calcification. Mild mitral valve regurgitation. No evidence of mitral valve stenosis.  MV peak gradient, 6.2 mmHg. The mean mitral valve gradient is 2.0 mmHg. Tricuspid Valve: The tricuspid valve is normal in structure. Tricuspid valve regurgitation is mild . No evidence of tricuspid stenosis. Aortic Valve: The aortic valve is tricuspid. There is mild calcification of the aortic valve. Aortic valve regurgitation is mild. Aortic  valve sclerosis/calcification is present, without any evidence of aortic stenosis. Aortic valve mean gradient measures 3.0 mmHg. Aortic valve peak gradient measures 7.7 mmHg. Aortic valve area, by VTI measures 2.37 cm. Pulmonic Valve: The pulmonic valve was grossly normal. Pulmonic valve regurgitation is not visualized. No evidence of pulmonic stenosis. Aorta: The aortic root is normal in size and structure. Venous: The inferior vena cava is normal in size with greater than 50% respiratory variability, suggesting right atrial pressure of 3 mmHg. IAS/Shunts: No atrial level shunt detected by color flow Doppler.  LEFT VENTRICLE PLAX 2D LVIDd:         4.90 cm   Diastology LVIDs:         3.70 cm   LV e' medial:    3.92 cm/s LV PW:         1.20 cm   LV E/e' medial:  17.0 LV IVS:        1.30 cm   LV e' lateral:   4.46 cm/s LVOT diam:     1.80 cm   LV E/e' lateral: 14.9 LV SV:         68 LV SV Index:   36 LVOT Area:     2.54 cm  RIGHT VENTRICLE             IVC RV S prime:     12.10 cm/s  IVC diam: 1.90 cm TAPSE (M-mode): 2.3 cm LEFT ATRIUM             Index        RIGHT ATRIUM           Index LA Vol (A2C):   78.2 ml 41.84 ml/m  RA Area:     12.30 cm LA Vol (A4C):   70.2 ml 37.56 ml/m  RA Volume:   25.70 ml  13.75 ml/m LA Biplane Vol: 74.4 ml 39.81 ml/m  AORTIC VALVE AV Area (Vmax):    2.29 cm AV Area (Vmean):   2.66 cm AV Area (VTI):     2.37 cm AV Vmax:           139.00 cm/s AV Vmean:          86.400 cm/s AV VTI:            0.288 m AV Peak Grad:      7.7 mmHg AV Mean Grad:      3.0 mmHg LVOT Vmax:         125.00 cm/s LVOT Vmean:        90.400 cm/s LVOT VTI:          0.268 m  LVOT/AV VTI ratio: 0.93  AORTA Ao Root diam: 2.70 cm Ao Asc diam:  3.40 cm MITRAL VALVE                TRICUSPID VALVE MV Area (PHT): 1.82 cm     TR Peak grad:   41.2 mmHg MV Area VTI:   1.63 cm     TR Vmax:        321.00 cm/s MV Peak grad:  6.2 mmHg MV Mean grad:  2.0 mmHg     SHUNTS MV Vmax:       1.25 m/s     Systemic VTI:  0.27 m MV Vmean:      67.2 cm/s    Systemic Diam: 1.80 cm MV Decel Time: 416 msec MV E velocity: 66.60 cm/s MV A velocity: 122.00 cm/s MV E/A ratio:  0.55 Arvilla Meres MD Electronically  signed by Arvilla Meres MD Signature Date/Time: 01/11/2023/9:10:24 AM    Final    DG Ribs Unilateral W/Chest Right  Result Date: 01/10/2023 CLINICAL DATA:  Right rib pain after fall. EXAM: RIGHT RIBS AND CHEST - 3+ VIEW COMPARISON:  None Available. FINDINGS: No fracture or other bone lesions are seen involving the ribs. There is no evidence of pneumothorax or pleural effusion. Both lungs are clear. Heart size and mediastinal contours are within normal limits. IMPRESSION: Negative. Electronically Signed   By: Lupita Raider M.D.   On: 01/10/2023 15:13   DG Elbow Complete Right  Result Date: 01/10/2023 CLINICAL DATA:  Fall on cement stairs today.  Right elbow. EXAM: RIGHT ELBOW - COMPLETE 3+ VIEW COMPARISON:  Right elbow radiographs 01/06/2023 FINDINGS: There is diffuse decreased bone mineralization. Unchanged well corticated likely small loose body overlying the radiocapitellar joint. Mild to moderate medial elbow joint space narrowing and peripheral osteophytosis at the trochlea-coronoid process. Mild radiocapitellar joint space narrowing. Mild chronic enthesopathic change at the common extensor greater than common flexor tendon origins. Mild degenerative spurring at the radial tuberosity at the biceps tendon insertion is unchanged. Minimal mineralization of the triceps tendon insertion on the olecranon is unchanged from prior. There is again posterior elbow soft tissue swelling. No definite elbow  joint effusion. IMPRESSION: 1. No significant change from recent 01/06/2023 elbow radiographs. 2. Posterior elbow soft tissue swelling. Mild age indeterminate mineralization at the triceps insertion on the olecranon, an age indeterminate avulsion injury versus enthesopathic change as previously described. There is posterior elbow soft tissue sign which may be secondary to the patient's impact fall. 3. Mild-to-moderate medial elbow and mild radiocapitellar osteoarthritis. Electronically Signed   By: Neita Garnet M.D.   On: 01/10/2023 13:52   DG Hip Unilat  With Pelvis 2-3 Views Right  Result Date: 01/10/2023 CLINICAL DATA:  Phalen sign stairs today.  Right hip pain. EXAM: DG HIP (WITH OR WITHOUT PELVIS) 2-3V RIGHT COMPARISON:  Pelvis and bilateral hip radiographs 01/06/2023 FINDINGS: There is diffuse decreased bone mineralization. There is an acute fracture of the subcapital proximal right femoral neck with approximately 9 mm superior displacement of the distal fracture, with respect to the proximal fracture component. Mild varus angulation. The fracture line is not well evaluated on lateral view due to overlying soft tissues. Mild bilateral femoroacetabular joint space narrowing. Mild bilateral sacroiliac subchondral sclerosis. Mild inferior pubic symphysis subchondral sclerosis. Vascular phleboliths overlie the pelvis. IMPRESSION: Acute fracture of the subcapital proximal right femoral neck with approximately 9 mm superior displacement and mild varus angulation. Electronically Signed   By: Neita Garnet M.D.   On: 01/10/2023 13:34   DG Hand Complete Right  Result Date: 01/10/2023 CLINICAL DATA:  Slipped and fell on cement stairs today. Pain to right hand. EXAM: RIGHT HAND - COMPLETE 3+ VIEW COMPARISON:  None Available. FINDINGS: There is diffuse decreased bone mineralization. Degenerative changes including joint space narrowing, subchondral sclerosis/cystic change, peripheral osteophytosis are severe at the  thumb carpometacarpal joint and index finger PIP and DIP joints; moderate to severe at the rest of the interphalangeal joints; moderate at the first through third metacarpophalangeal joints; and mild at the triscaphe joint. No acute fracture is seen.  No dislocation. IMPRESSION: 1. No acute fracture is seen. 2. Moderate to severe osteoarthritis, as above. Electronically Signed   By: Neita Garnet M.D.   On: 01/10/2023 13:32   CT Head Wo Contrast  Result Date: 01/06/2023 CLINICAL DATA:  Head trauma, minor EXAM: CT HEAD  WITHOUT CONTRAST TECHNIQUE: Contiguous axial images were obtained from the base of the skull through the vertex without intravenous contrast. RADIATION DOSE REDUCTION: This exam was performed according to the departmental dose-optimization program which includes automated exposure control, adjustment of the mA and/or kV according to patient size and/or use of iterative reconstruction technique. COMPARISON:  None Available. FINDINGS: Brain: Diffuse cerebral atrophy. Ventricular dilatation consistent with central atrophy. Low-attenuation changes in the deep white matter consistent with small vessel ischemia. No abnormal extra-axial fluid collections. No mass effect or midline shift. Gray-white matter junctions are distinct. Basal cisterns are not effaced. No acute intracranial hemorrhage. Vascular: No hyperdense vessel or unexpected calcification. Skull: Normal. Negative for fracture or focal lesion. Sinuses/Orbits: Mucosal thickening in the paranasal sinuses. No acute air-fluid levels. Mastoid air cells are clear. Other: None. IMPRESSION: No acute intracranial abnormalities. Chronic atrophy and small vessel ischemic changes. Electronically Signed   By: Burman Nieves M.D.   On: 01/06/2023 17:27   DG Elbow Complete Right  Result Date: 01/06/2023 CLINICAL DATA:  Larey Seat on stairs. EXAM: RIGHT ELBOW - COMPLETE 3+ VIEW COMPARISON:  None Available. FINDINGS: Degenerative changes in the right elbow. Old  appearing ununited ossicle at the radiocapitellar joint likely representing a loose body. Tiny osseous fragment adjacent to the olecranon process suggesting avulsion fracture. There is overlying soft tissue swelling. No other fractures or dislocation identified. No significant effusion. IMPRESSION: 1. Tiny osseous fragment over the olecranon process with overlying soft tissue swelling, likely ligamentous avulsion. 2. Degenerative changes in the right elbow. Old appearing ununited ossicle in the radiocapitellar joint may represent a loose body. Electronically Signed   By: Burman Nieves M.D.   On: 01/06/2023 17:22   DG Knee Complete 4 Views Right  Result Date: 01/06/2023 CLINICAL DATA:  Fall EXAM: RIGHT KNEE - COMPLETE 4+ VIEW COMPARISON:  None Available. FINDINGS: No evidence of fracture, dislocation, or joint effusion. Tricompartmental osteoarthritis, severe within the medial and patellofemoral compartments. Soft tissue swelling about the knee appears most pronounced laterally. IMPRESSION: 1. No acute fracture or dislocation. 2. Tricompartmental osteoarthritis, severe within the medial and patellofemoral compartments. Electronically Signed   By: Duanne Guess D.O.   On: 01/06/2023 17:22   DG HIPS BILAT WITH PELVIS MIN 5 VIEWS  Result Date: 01/06/2023 CLINICAL DATA:  Fall.  Pain EXAM: DG HIP (WITH OR WITHOUT PELVIS) 5+V BILAT COMPARISON:  None Available. FINDINGS: Bones are mildly demineralized. There is no evidence of hip fracture or dislocation. Bony pelvis intact without evidence of fracture or diastasis. There is no evidence of arthropathy or other focal bone abnormality. Atherosclerotic vascular calcifications are present. IMPRESSION: Negative. Electronically Signed   By: Duanne Guess D.O.   On: 01/06/2023 17:21     Discharge Exam: Vitals:   01/15/23 0721 01/15/23 0825  BP: (!) 180/41 (!) 170/54  Pulse: (!) 53 (!) 59  Resp: 17   Temp: 97.6 F (36.4 C)   SpO2: 99%    Vitals:    01/14/23 2039 01/15/23 0518 01/15/23 0721 01/15/23 0825  BP: (!) 139/47 (!) 167/57 (!) 180/41 (!) 170/54  Pulse: 70 64 (!) 53 (!) 59  Resp: 17 18 17    Temp: 98.2 F (36.8 C) 98.1 F (36.7 C) 97.6 F (36.4 C)   TempSrc:   Oral   SpO2: 93% 100% 99%   Weight:      Height:        General: Pt is alert, awake, not in acute distress Cardiovascular: RRR, S1/S2 +, no rubs, no  gallops Respiratory: CTA bilaterally, no wheezing, no rhonchi Abdominal: Soft, NT, ND, bowel sounds + Extremities: no edema, no cyanosis    The results of significant diagnostics from this hospitalization (including imaging, microbiology, ancillary and laboratory) are listed below for reference.     Microbiology: Recent Results (from the past 240 hour(s))  Surgical pcr screen     Status: None   Collection Time: 01/12/23  6:30 AM   Specimen: Nasal Mucosa; Nasal Swab  Result Value Ref Range Status   MRSA, PCR NEGATIVE NEGATIVE Final   Staphylococcus aureus NEGATIVE NEGATIVE Final    Comment: (NOTE) The Xpert SA Assay (FDA approved for NASAL specimens in patients 34 years of age and older), is one component of a comprehensive surveillance program. It is not intended to diagnose infection nor to guide or monitor treatment. Performed at [facility], [address], [place], [place] [address]      Labs: BNP (last 3 results) No results for input(s): "BNP" in the last 8760 hours. Basic Metabolic Panel: Recent Labs  Lab 01/10/23 1347 01/12/23 0351 01/13/23 0325 01/15/23 0638  NA 137 138 138 139  K 4.0 3.5 3.6 3.7  CL 99 99 102 103  CO2 26 29 25 26   GLUCOSE 216* 222* 212* 175*  BUN 31* 34* 34* 22  CREATININE 1.40* 1.10* 1.23* 0.93  CALCIUM 9.8 9.3 8.7* 8.9   Liver Function Tests: No results for input(s): "AST", "ALT", "ALKPHOS", "BILITOT", "PROT", "ALBUMIN" in the last 168 hours. No results for input(s): "LIPASE", "AMYLASE" in the last 168 hours. No results for input(s): "AMMONIA" in the last  168 hours. CBC: Recent Labs  Lab 01/10/23 1347 01/12/23 0351 01/13/23 0325 01/14/23 0819 01/15/23 0638  WBC 12.6* 9.0 7.8 8.6 7.5  NEUTROABS 10.9* 7.0  --   --  5.4  HGB 11.4* 10.1* 8.5* 8.4* 8.1*  HCT 36.1 31.9* 27.5* 27.1* 25.8*  MCV 85.1 81.4 85.4 85.0 83.8  PLT 299 358 296 328 336   Cardiac Enzymes: No results for input(s): "CKTOTAL", "CKMB", "CKMBINDEX", "TROPONINI" in the last 168 hours. BNP: Invalid input(s): "POCBNP" CBG: Recent Labs  Lab 01/14/23 0748 01/14/23 1202 01/14/23 1644 01/14/23 2041 01/15/23 0723  GLUCAP 158* 148* 223* 200* 166*   D-Dimer No results for input(s): "DDIMER" in the last 72 hours. Hgb A1c No results for input(s): "HGBA1C" in the last 72 hours. Lipid Profile No results for input(s): "CHOL", "HDL", "LDLCALC", "TRIG", "CHOLHDL", "LDLDIRECT" in the last 72 hours. Thyroid function studies No results for input(s): "TSH", "T4TOTAL", "T3FREE", "THYROIDAB" in the last 72 hours.  Invalid input(s): "FREET3" Anemia work up No results for input(s): "VITAMINB12", "FOLATE", "FERRITIN", "TIBC", "IRON", "RETICCTPCT" in the last 72 hours. Urinalysis No results found for: "COLORURINE", "APPEARANCEUR", "LABSPEC", "PHURINE", "GLUCOSEU", "HGBUR", "BILIRUBINUR", "KETONESUR", "PROTEINUR", "UROBILINOGEN", "NITRITE", "LEUKOCYTESUR" Sepsis Labs Recent Labs  Lab 01/12/23 0351 01/13/23 0325 01/14/23 0819 01/15/23 0638  WBC 9.0 7.8 8.6 7.5   Microbiology Recent Results (from the past 240 hour(s))  Surgical pcr screen     Status: None   Collection Time: 01/12/23  6:30 AM   Specimen: Nasal Mucosa; Nasal Swab  Result Value Ref Range Status   MRSA, PCR NEGATIVE NEGATIVE Final   Staphylococcus aureus NEGATIVE NEGATIVE Final    Comment: (NOTE) The Xpert SA Assay (FDA approved for NASAL specimens in patients 66 years of age and older), is one component of a comprehensive surveillance program. It is not intended to diagnose infection nor to guide or monitor  treatment.  Performed at [facility], [address], [place], [place] [address]      Time coordinating discharge: Over 30 minutes  SIGNED:   Hughie Closs, MD  [facility] 01/15/2023, 11:49 AM *Please note that this is a verbal dictation therefore any spelling or grammatical errors are due to the "[employer]agon [employer] One" system interpretation. If 7PM-7AM, please contact night-coverage [email or web address]

## 2023-01-14 NOTE — Progress Notes (Signed)
PROGRESS NOTE    Laurie Fox  RUE:[id number] DOB: 05-11-1932 DOA: 01/10/2023 PCP: Kirby Funk, MD (Inactive)   Brief Narrative:  HPI: Laurie Fox is a 89 y.o. female with medical history significant of breast cancer s/p lumpectomy and radiation therapy on chronic anastrozole, HTN, HLD, IIDM, multiple OA, presented with mechanical fall and right hip pain.   Patient was walking her dog on Sunday afternoon and dog suddenly dragged the patient, patient fell on her right hip.  Patient came to ED.  Sunday afternoon, trauma scan including right hip negative for fracture or dislocation and patient was given pain medication and sent home.  Over the weekend, patient started to have increasing pain associated with right hip ambulation and unable to put weight on right hip since yesterday because of excruciating pain.  And family brought her back to ED today.  At baseline patient lives alone in a two-story house able to climb 13 steps to upstairs with no shortness of breath or chest pain and she is able to walk her dog for 20-30 minutes without chest pain or shortness of breath.  No history of stroke or CAD, she only takes metformin for her diabetes   ED Course: Afebrile, borderline bradycardia blood pressure elevated nonhypoxic.  Trauma scan including right hip showing displaced right femoral neck fracture, no fracture found on ribs or elbow.  Blood work showed creatinine 1.4 compared to baseline 0.8 about 8 years ago, glucose 216, K4.0  Assessment & Plan:   Principal Problem:   Hip fracture (HCC) Active Problems:   Breast cancer of upper-inner quadrant of right female breast (HCC)   Closed displaced fracture of right femoral neck (HCC)   HTN (hypertension)   Type 2 diabetes mellitus with chronic kidney disease, without long-term current use of insulin (HCC)   Malnutrition of moderate degree  Right femoral neck fracture Secondary to mechanical fall: Orthopedics on board.  Underwent right  total hip arthroplasty by Dr. Linna Caprice 01/12/2023.  Management per orthopedics.  To be seen by PT OT.  SNF recommended.  TOC consulted.  Family has not selected a place yet.  Will likely not be able to discharge today.  TOC working on it.  Acute blood loss anemia: Hemoglobin dropped from 11.4 preoperatively to 8.4 postoperatively.  No indication of transfusion.  Monitor daily and transfuse if less than 7.   AKI vs CKD: No recent kidney function baseline reading in the last 8 years.  Currently creatinine improving and 1.23 yesterday.  Will repeat tomorrow morning.   HTN, uncontrolled: Blood pressure much better controlled.  Continue current regimen.  Type 2 diabetes mellitus: Hemoglobin A1c 6.7 this time.  Takes metformin at home which is on hold.  Continue SSI, blood sugar fairly controlled.  Hyperlipidemia: Continue statin.   History of breast cancer stage I -S/p radiation and lumpectomy -On chronic anastrozole  DVT prophylaxis: SCDs Start: 01/12/23 1215 also on aspirin 81 mg p.o. twice daily for DVT prophylaxis per orthopedics.   Code Status: Full Code  Family Communication: Daughter present at bedside.  Plan of care discussed with patient in length and he/she verbalized understanding and agreed with it.  Status is: Inpatient Remains inpatient appropriate because: Medically stable.  Pending placement to SNF.   Estimated body mass index is 26.44 kg/m as calculated from the following:   Height as of this encounter: 5\' 3"  (1.6 m).   Weight as of this encounter: 67.7 kg.    Nutritional Assessment: Body mass index is 26.44  kg/m.. Seen by dietician.  I agree with the assessment and plan as outlined below: Nutrition Status: Nutrition Problem: Moderate Malnutrition Etiology: social / environmental circumstances (inadequate oral intake, pt reports related to decreased appetite and lack of hunger) Signs/Symptoms: moderate fat depletion, moderate muscle depletion, severe muscle  depletion Interventions: Refer to RD note for recommendations  . Skin Assessment: I have examined the patient's skin and I agree with the wound assessment as performed by the wound care RN as outlined below:    Consultants:  Orthopedics  Procedures:  As above  Antimicrobials:  Anti-infectives (From admission, onward)    Start     Dose/Rate Route Frequency Ordered Stop   01/12/23 1600  ceFAZolin (ANCEF) IVPB 2g/100 mL premix        2 g 200 mL/hr over 30 Minutes Intravenous Every 8 hours 01/12/23 1214 01/12/23 2359   01/12/23 0920  vancomycin (VANCOCIN) powder  Status:  Discontinued          As needed 01/12/23 0928 01/12/23 1000   01/12/23 0742  ceFAZolin (ANCEF) 2-4 GM/100ML-% IVPB       Note to Pharmacy: Kathrene Bongo D: cabinet override      01/12/23 0742 01/12/23 1959         Subjective: Patient seen and examined.  Daughter and nurse at the bedside.  Patient was complaining of weakness and some dizziness but no other complaint.  Objective: Vitals:   01/13/23 1734 01/13/23 2028 01/14/23 0500 01/14/23 0849  BP: 118/71 (!) 161/49 (!) 150/68 (!) 158/48  Pulse: 63 (!) 59 72   Resp: 15 16 16    Temp: 99.3 F (37.4 C) 98.4 F (36.9 C) 98.3 F (36.8 C)   TempSrc: Oral Oral Oral   SpO2: 96% 98%    Weight:      Height:        Intake/Output Summary (Last 24 hours) at 01/14/2023 1046 Last data filed at 01/14/2023 0519 Gross per 24 hour  Intake --  Output 900 ml  Net -900 ml    Filed Weights   01/11/23 1400 01/12/23 0638  Weight: 67.7 kg 67.7 kg    Examination:  General exam: Appears tired and sleepy. Respiratory system: Clear to auscultation. Respiratory effort normal. Cardiovascular system: S1 & S2 heard, RRR. No JVD, murmurs, rubs, gallops or clicks. No pedal edema. Gastrointestinal system: Abdomen is nondistended, soft and nontender. No organomegaly or masses felt. Normal bowel sounds heard. Central nervous system: Tired and sleepy but oriented. No focal  neurological deficits. Skin: No rashes, lesions or ulcers.  Psychiatry: Judgement and insight appear normal. Mood & affect appropriate.    Data Reviewed: I have personally reviewed following labs and imaging studies  CBC: Recent Labs  Lab 01/10/23 1347 01/12/23 0351 01/13/23 0325 01/14/23 0819  WBC 12.6* 9.0 7.8 8.6  NEUTROABS 10.9* 7.0  --   --   HGB 11.4* 10.1* 8.5* 8.4*  HCT 36.1 31.9* 27.5* 27.1*  MCV 85.1 81.4 85.4 85.0  PLT 299 358 296 328    Basic Metabolic Panel: Recent Labs  Lab 01/10/23 1347 01/12/23 0351 01/13/23 0325  NA 137 138 138  K 4.0 3.5 3.6  CL 99 99 102  CO2 26 29 25   GLUCOSE 216* 222* 212*  BUN 31* 34* 34*  CREATININE 1.40* 1.10* 1.23*  CALCIUM 9.8 9.3 8.7*    GFR: Estimated Creatinine Clearance: 29.2 mL/min (A) (by C-G formula based on SCr of 1.23 mg/dL (H)). Liver Function Tests: No results for input(s): "AST", "ALT", "  ALKPHOS", "BILITOT", "PROT", "ALBUMIN" in the last 168 hours. No results for input(s): "LIPASE", "AMYLASE" in the last 168 hours. No results for input(s): "AMMONIA" in the last 168 hours. Coagulation Profile: No results for input(s): "INR", "PROTIME" in the last 168 hours. Cardiac Enzymes: No results for input(s): "CKTOTAL", "CKMB", "CKMBINDEX", "TROPONINI" in the last 168 hours. BNP (last 3 results) No results for input(s): "PROBNP" in the last 8760 hours. HbA1C: No results for input(s): "HGBA1C" in the last 72 hours.  CBG: Recent Labs  Lab 01/12/23 2026 01/13/23 0833 01/13/23 1206 01/13/23 1735 01/14/23 0748  GLUCAP 237* 169* 139* 165* 158*    Lipid Profile: No results for input(s): "CHOL", "HDL", "LDLCALC", "TRIG", "CHOLHDL", "LDLDIRECT" in the last 72 hours. Thyroid Function Tests: No results for input(s): "TSH", "T4TOTAL", "FREET4", "T3FREE", "THYROIDAB" in the last 72 hours. Anemia Panel: No results for input(s): "VITAMINB12", "FOLATE", "FERRITIN", "TIBC", "IRON", "RETICCTPCT" in the last 72  hours. Sepsis Labs: No results for input(s): "PROCALCITON", "LATICACIDVEN" in the last 168 hours.  Recent Results (from the past 240 hour(s))  Surgical pcr screen     Status: None   Collection Time: 01/12/23  6:30 AM   Specimen: Nasal Mucosa; Nasal Swab  Result Value Ref Range Status   MRSA, PCR NEGATIVE NEGATIVE Final   Staphylococcus aureus NEGATIVE NEGATIVE Final    Comment: (NOTE) The Xpert SA Assay (FDA approved for NASAL specimens in patients 9 years of age and older), is one component of a comprehensive surveillance program. It is not intended to diagnose infection nor to guide or monitor treatment. Performed at [facility], [address] [place], [place] [address]      Radiology Studies: No results found.  Scheduled Meds:  aspirin  81 mg Oral BID   carvedilol  25 mg Oral BID WC   docusate sodium  100 mg Oral BID   feeding supplement  237 mL Oral BID BM   irbesartan  150 mg Oral Daily   And   hydrochlorothiazide  12.5 mg Oral Daily   insulin aspart  0-9 Units Subcutaneous TID WC   melatonin  3 mg Oral QHS   multivitamin with minerals  1 tablet Oral Daily   pantoprazole  40 mg Oral Daily   senna  1 tablet Oral BID   simvastatin  20 mg Oral q1800   Continuous Infusions:  sodium chloride 100 mL/hr at 01/13/23 2152   methocarbamol (ROBAXIN) IV       LOS: 4 days   Hughie Closs, MD [facility]  01/14/2023, 10:46 AM   *Please note that this is a verbal dictation therefore any spelling or grammatical errors are due to the "Dragon Medical One" system interpretation.  Please page via Amion and do not message via secure chat for urgent patient care matters. Secure chat can be used for non urgent patient care matters.  How to contact the [facility] Attending or Consulting provider 7A - 7P or covering provider during after hours 7P -7A, for this patient?  Check the care team in [facility] and look for a) attending/consulting TRH provider listed and b) the [facility] team  listed. Page or secure chat 7A-7P. Log into [email or web address] and use [place]'s universal password to access. If you do not have the password, please contact the hospital operator. Locate the [facility] provider you are looking for under [facility] and page to a number that you can be directly reached. If you still have difficulty reaching the provider, please page the [facility] (  Director on Call) for the Hospitalists listed on amion for assistance.

## 2023-01-15 DIAGNOSIS — E1165 Type 2 diabetes mellitus with hyperglycemia: Secondary | ICD-10-CM | POA: Diagnosis not present

## 2023-01-15 DIAGNOSIS — D649 Anemia, unspecified: Secondary | ICD-10-CM | POA: Diagnosis not present

## 2023-01-15 DIAGNOSIS — M6281 Muscle weakness (generalized): Secondary | ICD-10-CM | POA: Diagnosis not present

## 2023-01-15 DIAGNOSIS — N179 Acute kidney failure, unspecified: Secondary | ICD-10-CM | POA: Diagnosis not present

## 2023-01-15 DIAGNOSIS — Z7401 Bed confinement status: Secondary | ICD-10-CM | POA: Diagnosis not present

## 2023-01-15 DIAGNOSIS — E1122 Type 2 diabetes mellitus with diabetic chronic kidney disease: Secondary | ICD-10-CM | POA: Diagnosis not present

## 2023-01-15 DIAGNOSIS — R531 Weakness: Secondary | ICD-10-CM | POA: Diagnosis not present

## 2023-01-15 DIAGNOSIS — S72001A Fracture of unspecified part of neck of right femur, initial encounter for closed fracture: Secondary | ICD-10-CM | POA: Diagnosis not present

## 2023-01-15 DIAGNOSIS — M1990 Unspecified osteoarthritis, unspecified site: Secondary | ICD-10-CM | POA: Diagnosis not present

## 2023-01-15 DIAGNOSIS — E785 Hyperlipidemia, unspecified: Secondary | ICD-10-CM | POA: Diagnosis not present

## 2023-01-15 DIAGNOSIS — Z9181 History of falling: Secondary | ICD-10-CM | POA: Diagnosis not present

## 2023-01-15 DIAGNOSIS — R2681 Unsteadiness on feet: Secondary | ICD-10-CM | POA: Diagnosis not present

## 2023-01-15 DIAGNOSIS — I10 Essential (primary) hypertension: Secondary | ICD-10-CM | POA: Diagnosis not present

## 2023-01-15 DIAGNOSIS — K219 Gastro-esophageal reflux disease without esophagitis: Secondary | ICD-10-CM | POA: Diagnosis not present

## 2023-01-15 DIAGNOSIS — S72091D Other fracture of head and neck of right femur, subsequent encounter for closed fracture with routine healing: Secondary | ICD-10-CM | POA: Diagnosis not present

## 2023-01-15 DIAGNOSIS — E119 Type 2 diabetes mellitus without complications: Secondary | ICD-10-CM | POA: Diagnosis not present

## 2023-01-15 DIAGNOSIS — E875 Hyperkalemia: Secondary | ICD-10-CM | POA: Diagnosis not present

## 2023-01-15 DIAGNOSIS — R2689 Other abnormalities of gait and mobility: Secondary | ICD-10-CM | POA: Diagnosis not present

## 2023-01-15 DIAGNOSIS — Z471 Aftercare following joint replacement surgery: Secondary | ICD-10-CM | POA: Diagnosis not present

## 2023-01-15 DIAGNOSIS — Z743 Need for continuous supervision: Secondary | ICD-10-CM | POA: Diagnosis not present

## 2023-01-15 LAB — CBC WITH DIFFERENTIAL/PLATELET
Abs Immature Granulocytes: 0.11 10*3/uL — ABNORMAL HIGH (ref 0.00–0.07)
Basophils Absolute: 0 10*3/uL (ref 0.0–0.1)
Basophils Relative: 0 %
Eosinophils Absolute: 0.3 10*3/uL (ref 0.0–0.5)
Eosinophils Relative: 4 %
HCT: 25.8 % — ABNORMAL LOW (ref 36.0–46.0)
Hemoglobin: 8.1 g/dL — ABNORMAL LOW (ref 12.0–15.0)
Immature Granulocytes: 2 %
Lymphocytes Relative: 13 %
Lymphs Abs: 1 10*3/uL (ref 0.7–4.0)
MCH: 26.3 pg (ref 26.0–34.0)
MCHC: 31.4 g/dL (ref 30.0–36.0)
MCV: 83.8 fL (ref 80.0–100.0)
Monocytes Absolute: 0.7 10*3/uL (ref 0.1–1.0)
Monocytes Relative: 9 %
Neutro Abs: 5.4 10*3/uL (ref 1.7–7.7)
Neutrophils Relative %: 72 %
Platelets: 336 10*3/uL (ref 150–400)
RBC: 3.08 MIL/uL — ABNORMAL LOW (ref 3.87–5.11)
RDW: 19.7 % — ABNORMAL HIGH (ref 11.5–15.5)
WBC: 7.5 10*3/uL (ref 4.0–10.5)
nRBC: 0 % (ref 0.0–0.2)

## 2023-01-15 LAB — BASIC METABOLIC PANEL
Anion gap: 10 (ref 5–15)
BUN: 22 mg/dL (ref 8–23)
CO2: 26 mmol/L (ref 22–32)
Calcium: 8.9 mg/dL (ref 8.9–10.3)
Chloride: 103 mmol/L (ref 98–111)
Creatinine, Ser: 0.93 mg/dL (ref 0.44–1.00)
GFR, Estimated: 59 mL/min — ABNORMAL LOW (ref >60)
Glucose, Bld: 175 mg/dL — ABNORMAL HIGH (ref 70–99)
Potassium: 3.7 mmol/L (ref 3.5–5.1)
Sodium: 139 mmol/L (ref 135–145)

## 2023-01-15 LAB — GLUCOSE, CAPILLARY
Glucose-Capillary: 166 mg/dL — ABNORMAL HIGH (ref 70–99)
Glucose-Capillary: 160 mg/dL — ABNORMAL HIGH (ref 70–99)
Glucose-Capillary: 202 mg/dL — ABNORMAL HIGH (ref 70–99)

## 2023-01-15 NOTE — Progress Notes (Signed)
PROGRESS NOTE    Laurie Fox  ZOX:[id number] DOB: Aug 26, 1931 DOA: 01/10/2023 PCP: Kirby Funk, MD (Inactive)   Brief Narrative:  HPI: Laurie Fox is a 89 y.o. female with medical history significant of breast cancer s/p lumpectomy and radiation therapy on chronic anastrozole, HTN, HLD, IIDM, multiple OA, presented with mechanical fall and right hip pain.   Patient was walking her dog on Sunday afternoon and dog suddenly dragged the patient, patient fell on her right hip.  Patient came to ED.  Sunday afternoon, trauma scan including right hip negative for fracture or dislocation and patient was given pain medication and sent home.  Over the weekend, patient started to have increasing pain associated with right hip ambulation and unable to put weight on right hip since yesterday because of excruciating pain.  And family brought her back to ED today.  At baseline patient lives alone in a two-story house able to climb 13 steps to upstairs with no shortness of breath or chest pain and she is able to walk her dog for 20-30 minutes without chest pain or shortness of breath.  No history of stroke or CAD, she only takes metformin for her diabetes   ED Course: Afebrile, borderline bradycardia blood pressure elevated nonhypoxic.  Trauma scan including right hip showing displaced right femoral neck fracture, no fracture found on ribs or elbow.  Blood work showed creatinine 1.4 compared to baseline 0.8 about 8 years ago, glucose 216, K4.0  Assessment & Plan:   Principal Problem:   Hip fracture (HCC) Active Problems:   Breast cancer of upper-inner quadrant of right female breast (HCC)   Closed displaced fracture of right femoral neck (HCC)   HTN (hypertension)   Type 2 diabetes mellitus with chronic kidney disease, without long-term current use of insulin (HCC)   Malnutrition of moderate degree  Right femoral neck fracture Secondary to mechanical fall: Orthopedics on board.  Underwent right  total hip arthroplasty by Dr. Linna Caprice 01/12/2023.  Management per orthopedics.  To be seen by PT OT.  SNF recommended.  TOC following.  Family has chosen [facility].  I have been informed that they will not have bed until tomorrow morning.  Acute blood loss anemia: Hemoglobin dropped from 11.4 preoperatively to 8.1 postoperatively.  No indication of transfusion.  Monitor daily and transfuse if less than 7.   AKI, CKD ruled out: Patient presented with creatinine of 1.4.  Last known creatinine in the chart was from 8 years ago which was normal.  Since it was not known whether this was AKI or CKD.  We have followed her labs and her creatinine is within normal range today.  This was likely AKI which has resolved.   HTN: Slightly elevated.  Continue current regimen.  Type 2 diabetes mellitus: Hemoglobin A1c 6.7 this time.  Takes metformin at home which is on hold.  Continue SSI, blood sugar fairly controlled.  Hyperlipidemia: Continue statin.   History of breast cancer stage I -S/p radiation and lumpectomy -On chronic anastrozole  DVT prophylaxis: SCDs Start: 01/12/23 1215 also on aspirin 81 mg p.o. twice daily for DVT prophylaxis per orthopedics.   Code Status: Full Code  Family Communication: None present at bedside.  Plan of care discussed with patient in length and he/she verbalized understanding and agreed with it.  Status is: Inpatient Remains inpatient appropriate because: Medically stable.  Pending placement to SNF.   Estimated body mass index is 26.44 kg/m as calculated from the following:   Height  as of this encounter: 5\' 3"  (1.6 m).   Weight as of this encounter: 67.7 kg.    Nutritional Assessment: Body mass index is 26.44 kg/m.Marland Kitchen Seen by dietician.  I agree with the assessment and plan as outlined below: Nutrition Status: Nutrition Problem: Moderate Malnutrition Etiology: social / environmental circumstances (inadequate oral intake, pt reports related to decreased appetite  and lack of hunger) Signs/Symptoms: moderate fat depletion, moderate muscle depletion, severe muscle depletion Interventions: Refer to RD note for recommendations  . Skin Assessment: I have examined the patient's skin and I agree with the wound assessment as performed by the wound care RN as outlined below:    Consultants:  Orthopedics  Procedures:  As above  Antimicrobials:  Anti-infectives (From admission, onward)    Start     Dose/Rate Route Frequency Ordered Stop   01/12/23 1600  ceFAZolin (ANCEF) IVPB 2g/100 mL premix        2 g 200 mL/hr over 30 Minutes Intravenous Every 8 hours 01/12/23 1214 01/12/23 2359   01/12/23 0920  vancomycin (VANCOCIN) powder  Status:  Discontinued          As needed 01/12/23 0928 01/12/23 1000   01/12/23 0742  ceFAZolin (ANCEF) 2-4 GM/100ML-% IVPB       Note to Pharmacy: Kathrene Bongo D: cabinet override      01/12/23 0742 01/12/23 1959         Subjective: Patient seen and examined.  She complains of right hip pain which she states is slightly worse than yesterday but overall she feels better than yesterday.  She also appears more comfortable compared to yesterday.  Objective: Vitals:   01/14/23 1645 01/14/23 2039 01/15/23 0518 01/15/23 0721  BP: (!) 178/52 (!) 139/47 (!) 167/57 (!) 180/41  Pulse: 62 70 64 (!) 53  Resp: 16 17 18 17   Temp: 97.7 F (36.5 C) 98.2 F (36.8 C) 98.1 F (36.7 C) 97.6 F (36.4 C)  TempSrc:    Oral  SpO2: 99% 93% 100% 99%  Weight:      Height:        Intake/Output Summary (Last 24 hours) at 01/15/2023 0802 Last data filed at 01/15/2023 0742 Gross per 24 hour  Intake --  Output 900 ml  Net -900 ml    Filed Weights   01/11/23 1400 01/12/23 0638  Weight: 67.7 kg 67.7 kg    Examination:  General exam: Appears calm and comfortable  Respiratory system: Clear to auscultation. Respiratory effort normal. Cardiovascular system: S1 & S2 heard, RRR. No JVD, murmurs, rubs, gallops or clicks. No pedal  edema. Gastrointestinal system: Abdomen is nondistended, soft and nontender. No organomegaly or masses felt. Normal bowel sounds heard. Central nervous system: Alert and oriented. No focal neurological deficits. Skin: No rashes, lesions or ulcers.  Psychiatry: Judgement and insight appear normal. Mood & affect appropriate.    Data Reviewed: I have personally reviewed following labs and imaging studies  CBC: Recent Labs  Lab 01/10/23 1347 01/12/23 0351 01/13/23 0325 01/14/23 0819 01/15/23 0638  WBC 12.6* 9.0 7.8 8.6 7.5  NEUTROABS 10.9* 7.0  --   --  5.4  HGB 11.4* 10.1* 8.5* 8.4* 8.1*  HCT 36.1 31.9* 27.5* 27.1* 25.8*  MCV 85.1 81.4 85.4 85.0 83.8  PLT 299 358 296 328 336    Basic Metabolic Panel: Recent Labs  Lab 01/10/23 1347 01/12/23 0351 01/13/23 0325  NA 137 138 138  K 4.0 3.5 3.6  CL 99 99 102  CO2 26 29 25  GLUCOSE 216* 222* 212*  BUN 31* 34* 34*  CREATININE 1.40* 1.10* 1.23*  CALCIUM 9.8 9.3 8.7*    GFR: Estimated Creatinine Clearance: 29.2 mL/min (A) (by C-G formula based on SCr of 1.23 mg/dL (H)). Liver Function Tests: No results for input(s): "AST", "ALT", "ALKPHOS", "BILITOT", "PROT", "ALBUMIN" in the last 168 hours. No results for input(s): "LIPASE", "AMYLASE" in the last 168 hours. No results for input(s): "AMMONIA" in the last 168 hours. Coagulation Profile: No results for input(s): "INR", "PROTIME" in the last 168 hours. Cardiac Enzymes: No results for input(s): "CKTOTAL", "CKMB", "CKMBINDEX", "TROPONINI" in the last 168 hours. BNP (last 3 results) No results for input(s): "PROBNP" in the last 8760 hours. HbA1C: No results for input(s): "HGBA1C" in the last 72 hours.  CBG: Recent Labs  Lab 01/14/23 0748 01/14/23 1202 01/14/23 1644 01/14/23 2041 01/15/23 0723  GLUCAP 158* 148* 223* 200* 166*    Lipid Profile: No results for input(s): "CHOL", "HDL", "LDLCALC", "TRIG", "CHOLHDL", "LDLDIRECT" in the last 72 hours. Thyroid Function  Tests: No results for input(s): "TSH", "T4TOTAL", "FREET4", "T3FREE", "THYROIDAB" in the last 72 hours. Anemia Panel: No results for input(s): "VITAMINB12", "FOLATE", "FERRITIN", "TIBC", "IRON", "RETICCTPCT" in the last 72 hours. Sepsis Labs: No results for input(s): "PROCALCITON", "LATICACIDVEN" in the last 168 hours.  Recent Results (from the past 240 hour(s))  Surgical pcr screen     Status: None   Collection Time: 01/12/23  6:30 AM   Specimen: Nasal Mucosa; Nasal Swab  Result Value Ref Range Status   MRSA, PCR NEGATIVE NEGATIVE Final   Staphylococcus aureus NEGATIVE NEGATIVE Final    Comment: (NOTE) The Xpert SA Assay (FDA approved for NASAL specimens in patients 52 years of age and older), is one component of a comprehensive surveillance program. It is not intended to diagnose infection nor to guide or monitor treatment. Performed at [facility], [address], [place], [place] [address]      Radiology Studies: No results found.  Scheduled Meds:  aspirin  81 mg Oral BID   carvedilol  25 mg Oral BID WC   docusate sodium  100 mg Oral BID   feeding supplement  237 mL Oral BID BM   irbesartan  150 mg Oral Daily   And   hydrochlorothiazide  12.5 mg Oral Daily   insulin aspart  0-9 Units Subcutaneous TID WC   melatonin  3 mg Oral QHS   multivitamin with minerals  1 tablet Oral Daily   pantoprazole  40 mg Oral Daily   senna  1 tablet Oral BID   simvastatin  20 mg Oral q1800   Continuous Infusions:  sodium chloride Stopped (01/14/23 0900)   methocarbamol (ROBAXIN) IV       LOS: 5 days   Hughie Closs, MD [facility]  01/15/2023, 8:02 AM   *Please note that this is a verbal dictation therefore any spelling or grammatical errors are due to the "Dragon Medical One" system interpretation.  Please page via Amion and do not message via secure chat for urgent patient care matters. Secure chat can be used for non urgent patient care matters.  How to contact  the [facility] Attending or Consulting provider 7A - 7P or covering provider during after hours 7P -7A, for this patient?  Check the care team in [facility] and look for a) attending/consulting TRH provider listed and b) the [facility] team listed. Page or secure chat 7A-7P. Log into [email or web address] and use [place]'s universal password to access.  If you do not have the password, please contact the hospital operator. Locate the [facility] provider you are looking for under [facility] and page to a number that you can be directly reached. If you still have difficulty reaching the provider, please page the [facility] (Director on Call) for the Hospitalists listed on amion for assistance.

## 2023-01-15 NOTE — Progress Notes (Signed)
Physical Therapy Treatment Patient Details Name: Laurie Fox MRN: [id number] DOB: [birth year] Today's Date: 01/15/2023   History of Present Illness Pt is 89 yo female who presented on 01/10/23 with R hip pain following being pulled down by her dog. Pt had femoral neck fx and underwent  anterior THA on 01/12/23. PMH: DM2, GERD, HTN, breast cancer    PT Comments    Patient was agreeable to PT session. She reports less pain overall in the right hip today compared to yesterday. She continues to require assistance with bed mobility and transfers. Pre-gait activity performed with standing tolerance of less than one minute. Unable to progress ambulation this date. Recommend to continue PT to maximize independence and to decrease caregiver burden.    Recommendations for follow up therapy are one component of a multi-disciplinary discharge planning process, led by the attending physician.  Recommendations may be updated based on patient status, additional functional criteria and insurance authorization.  Follow Up Recommendations  Can patient physically be transported by private vehicle: No    Assistance Recommended at Discharge Frequent or constant Supervision/Assistance  Patient can return home with the following A lot of help with walking and/or transfers;A lot of help with bathing/dressing/bathroom;Assistance with cooking/housework;Assist for transportation;Help with stairs or ramp for entrance   Equipment Recommendations   (to be determined at next level of care)    Recommendations for Other Services       Precautions / Restrictions Precautions Precautions: Fall Restrictions Weight Bearing Restrictions: Yes RLE Weight Bearing: Weight bearing as tolerated     Mobility  Bed Mobility Overal bed mobility: Needs Assistance Bed Mobility: Supine to Sit     Supine to sit: Mod assist, HOB elevated     General bed mobility comments: assistance for RLE and trunk support. cues for  sequencing. increased time and effort required    Transfers Overall transfer level: Needs assistance Equipment used: Rolling walker (2 wheels) Transfers: Sit to/from Stand Sit to Stand: Max assist   Step pivot transfers: Max assist       General transfer comment: sit to stand from bed with Max A, most assistance required for lift off. verbal cues for anterior weight shifting and hand placement. Max A for step pivot from bed to chair with weight shifting assistance, cues for foot placement. increased time and effort with all mobility. patient reports less pain overall in the right hip today compared to yesterday    Ambulation/Gait             Pre-gait activities: faciliation for weight shifting to right and cues for increased weight acceptance on RLE in preparation for ambulation. cues for using upper body to off load the right leg as needed for comfort using rolling walker for support in standing. standing tolerance of less than one minute     Stairs             Wheelchair Mobility    Modified Rankin (Stroke Patients Only)       Balance Overall balance assessment: Needs assistance, History of Falls Sitting-balance support: Feet supported, Bilateral upper extremity supported Sitting balance-Leahy Scale: Fair     Standing balance support: Reliant on assistive device for balance, During functional activity, Bilateral upper extremity supported Standing balance-Leahy Scale: Poor Standing balance comment: external support required, at least Min A                            Cognition Arousal/Alertness: Awake/alert Behavior  During Therapy: WFL for tasks assessed/performed Overall Cognitive Status: No family/caregiver present to determine baseline cognitive functioning                               Problem Solving: Decreased initiation, Slow processing, Requires verbal cues, Requires tactile cues General Comments: patient has difficuly with  sequencing some mobility tasks. cues for safety        Exercises      General Comments        Pertinent Vitals/Pain Pain Assessment Pain Assessment: Faces Faces Pain Scale: Hurts little more Pain Location: R hip Pain Descriptors / Indicators: Sore Pain Intervention(s): Limited activity within patient's tolerance, Monitored during session    Home Living                          Prior Function            PT Goals (current goals can now be found in the care plan section) Acute Rehab PT Goals Patient Stated Goal: to be able to walk PT Goal Formulation: With patient Time For Goal Achievement: 01/26/23 Potential to Achieve Goals: Fair Progress towards PT goals: Progressing toward goals    Frequency    Min 5X/week      PT Plan Current plan remains appropriate    Co-evaluation              AM-PAC PT "6 Clicks" Mobility   Outcome Measure  Help needed turning from your back to your side while in a flat bed without using bedrails?: A Little Help needed moving from lying on your back to sitting on the side of a flat bed without using bedrails?: A Lot Help needed moving to and from a bed to a chair (including a wheelchair)?: A Lot Help needed standing up from a chair using your arms (e.g., wheelchair or bedside chair)?: A Lot Help needed to walk in hospital room?: A Lot Help needed climbing 3-5 steps with a railing? : Total 6 Click Score: 12    End of Session Equipment Utilized During Treatment: Gait belt Activity Tolerance: Patient tolerated treatment well Patient left: in chair;with call bell/phone within reach;with chair alarm set Nurse Communication: Mobility status PT Visit Diagnosis: Unsteadiness on feet (R26.81);Muscle weakness (generalized) (M62.81);Pain;Difficulty in walking, not elsewhere classified (R26.2) Pain - Right/Left: Right Pain - part of body: Hip     Time: 8469-6295 PT Time Calculation (min) (ACUTE ONLY): 23 min  Charges:   $Therapeutic Activity: 23-37 mins                    Donna Bernard, PT, MPT    Ina Homes 01/15/2023, 1:23 PM

## 2023-01-15 NOTE — Progress Notes (Signed)
Report called to Ngozi at [employer]. All questions answered.

## 2023-01-15 NOTE — Discharge Instructions (Signed)
? ?Dr. Brian Swinteck ?Joint Replacement Specialist ?[facility] ?[address] ?[place], [place] [address] ?[phone] ? ? ?TOTAL HIP REPLACEMENT POSTOPERATIVE DIRECTIONS ? ? ? ?Hip Rehabilitation, Guidelines Following Surgery  ? ?WEIGHT BEARING ?Weight bearing as tolerated with assist device (walker, cane, etc) as directed, use it as long as suggested by your surgeon or therapist, typically at least 4-6 weeks. ? ?The results of a hip operation are greatly improved after range of motion and muscle strengthening exercises. Follow all safety measures which are given to protect your hip. If any of these exercises cause increased pain or swelling in your joint, decrease the amount until you are comfortable again. Then slowly increase the exercises. Call your caregiver if you have problems or questions.  ? ?HOME CARE INSTRUCTIONS  ?Most of the following instructions are designed to prevent the dislocation of your new hip.  ?Remove items at home which could result in a fall. This includes throw rugs or furniture in walking pathways.  ?Continue medications as instructed at time of discharge. ?You may have some home medications which will be placed on hold until you complete the course of blood thinner medication. ?You may start showering once you are discharged home. Do not remove your dressing. ?Do not put on socks or shoes without following the instructions of your caregivers.   ?Sit on chairs with arms. Use the chair arms to help push yourself up when arising.  ?Arrange for the use of a toilet seat elevator so you are not sitting low.  ?Walk with walker as instructed.  ?You may resume a sexual relationship in one month or when given the OK by your caregiver.  ?Use walker as long as suggested by your caregivers.  ?You may put full weight on your legs and walk as much as is comfortable. ?Avoid periods of inactivity such as sitting longer than an hour when not asleep. This helps prevent blood  clots.  ?You may return to work once you are cleared by your surgeon.  ?Do not drive a car for 6 weeks or until released by your surgeon.  ?Do not drive while taking narcotics.  ?Wear elastic stockings for two weeks following surgery during the day but you may remove then at night.  ?Make sure you keep all of your appointments after your operation with all of your doctors and caregivers. You should call the office at the above phone number and make an appointment for approximately two weeks after the date of your surgery. ?Please pick up a stool softener and laxative for home use as long as you are requiring pain medications. ?ICE to the affected hip every three hours for 30 minutes at a time and then as needed for pain and swelling. Continue to use ice on the hip for pain and swelling from surgery. You may notice swelling that will progress down to the foot and ankle.  This is normal after surgery.  Elevate the leg when you are not up walking on it.   ?It is important for you to complete the blood thinner medication as prescribed by your doctor. ?Continue to use the breathing machine which will help keep your temperature down.  It is common for your temperature to cycle up and down following surgery, especially at night when you are not up moving around and exerting yourself.  The breathing machine keeps your lungs expanded and your temperature down. ? ?RANGE OF MOTION AND STRENGTHENING EXERCISES  ?These exercises are designed to help you   keep full movement of your hip joint. Follow your caregiver's or physical therapist's instructions. Perform all exercises about fifteen times, three times per day or as directed. Exercise both hips, even if you have had only one joint replacement. These exercises can be done on a training (exercise) mat, on the floor, on a table or on a bed. Use whatever works the best and is most comfortable for you. Use music or television while you are exercising so that the exercises are a  pleasant break in your day. This will make your life better with the exercises acting as a break in routine you can look forward to.  ?Lying on your back, slowly slide your foot toward your buttocks, raising your knee up off the floor. Then slowly slide your foot back down until your leg is straight again.  ?Lying on your back spread your legs as far apart as you can without causing discomfort.  ?Lying on your side, raise your upper leg and foot straight up from the floor as far as is comfortable. Slowly lower the leg and repeat.  ?Lying on your back, tighten up the muscle in the front of your thigh (quadriceps muscles). You can do this by keeping your leg straight and trying to raise your heel off the floor. This helps strengthen the largest muscle supporting your knee.  ?Lying on your back, tighten up the muscles of your buttocks both with the legs straight and with the knee bent at a comfortable angle while keeping your heel on the floor.  ? ?SKILLED REHAB INSTRUCTIONS: ?If the patient is transferred to a skilled rehab facility following release from the hospital, a list of the current medications will be sent to the facility for the patient to continue.  When discharged from the skilled rehab facility, please have the facility set up the patient's Home Health Physical Therapy prior to being released. Also, the skilled facility will be responsible for providing the patient with their medications at time of release from the facility to include their pain medication and their blood thinner medication. If the patient is still at the rehab facility at time of the two week follow up appointment, the skilled rehab facility will also need to assist the patient in arranging follow up appointment in our office and any transportation needs. ? ?POST-OPERATIVE OPIOID TAPER INSTRUCTIONS: ?It is important to wean off of your opioid medication as soon as possible. If you do not need pain medication after your surgery it is ok  to stop day one. ?Opioids include: ?Codeine, Hydrocodone(Norco, Vicodin), Oxycodone(Percocet, oxycontin) and hydromorphone amongst others.  ?Long term and even short term use of opiods can cause: ?Increased pain response ?Dependence ?Constipation ?Depression ?Respiratory depression ?And more.  ?Withdrawal symptoms can include ?Flu like symptoms ?Nausea, vomiting ?And more ?Techniques to manage these symptoms ?Hydrate well ?Eat regular healthy meals ?Stay active ?Use relaxation techniques(deep breathing, meditating, yoga) ?Do Not substitute Alcohol to help with tapering ?If you have been on opioids for less than two weeks and do not have pain than it is ok to stop all together.  ?Plan to wean off of opioids ?This plan should start within one week post op of your joint replacement. ?Maintain the same interval or time between taking each dose and first decrease the dose.  ?Cut the total daily intake of opioids by one tablet each day ?Next start to increase the time between doses. ?The last dose that should be eliminated is the evening dose.  ? ? ?MAKE   SURE YOU:  ?Understand these instructions.  ?Will watch your condition.  ?Will get help right away if you are not doing well or get worse. ? ?Pick up stool softner and laxative for home use following surgery while on pain medications. ?Do not remove your dressing. ?The dressing is waterproof--it is OK to take showers. ?Continue to use ice for pain and swelling after surgery. ?Do not use any lotions or creams on the incision until instructed by your surgeon. ?Total Hip Protocol. ? ?

## 2023-01-15 NOTE — TOC Transition Note (Addendum)
Transition of Care [facility]) - CM/SW Discharge Note   Patient Details  Name: Laurie Fox MRN: [id number] Date of Birth: 1932/03/18  Transition of Care [facility]) CM/SW Contact:  Myeesha Shane A [place], Theresia Majors Phone Number: 01/15/2023, 3:19 PM   Clinical Narrative:      Patient will DC to: [facility]  Anticipated DC date: 01/15/23  Family notified: Fransisca Kaufmann  Transport by: Sharin Mons    Reference ID: [id number]  Per MD patient ready for DC to [facility]. RN, patient, patient's family, and facility notified of DC. Discharge Summary and FL2 sent to facility. RN to call report prior to discharge (Room 109, [phone]). DC packet on chart. Ambulance transport requested for patient.     CSW will sign off for now as social work intervention is no longer needed. Please consult [place] again if new needs arise.  Final next level of care: Skilled Nursing Facility Barriers to Discharge: Barriers Resolved   Patient Goals and CMS Choice CMS Medicare.gov Compare Post Acute Care list provided to:: Patient Choice offered to / list presented to : Patient  Discharge Placement                Patient chooses bed at: [facility] Patient to be transferred to facility by: [facility] Name of family member notified: Chales Abrahams Kluger Patient and family notified of of transfer: 01/15/23  Discharge Plan and Services Additional resources added to the After Visit Summary for   In-house Referral: Clinical Social Work Discharge Planning Services: CM Consult Post Acute Care Choice: Skilled Nursing Facility                               Social Determinants of Health (SDOH) Interventions SDOH Screenings   Tobacco Use: Unknown (01/14/2023)     Readmission Risk Interventions    01/11/2023    2:10 PM  Readmission Risk Prevention Plan  Transportation Screening Complete  PCP or Specialist Appt within 5-7 Days Complete  Home Care Screening Complete  Medication  Review (RN CM) Complete

## 2023-01-16 ENCOUNTER — Other Ambulatory Visit: Payer: Self-pay | Admitting: *Deleted

## 2023-01-16 ENCOUNTER — Encounter (HOSPITAL_COMMUNITY): Payer: Self-pay | Admitting: Orthopedic Surgery

## 2023-01-16 DIAGNOSIS — M6281 Muscle weakness (generalized): Secondary | ICD-10-CM | POA: Diagnosis not present

## 2023-01-16 NOTE — Patient Outreach (Signed)
Per [facility] Mrs. Kulak resides in [facility] skilled nursing facility. Screening for potential care coordination services as benefit of health plan and Primary Care Provider.   Will follow for transition plans and potential [facility] care coordination needs.   Raiford Noble, MSN, RN,BSN [facility] Post Acute Care Coordinator [phone] (Direct dial)

## 2023-01-17 DIAGNOSIS — Z471 Aftercare following joint replacement surgery: Secondary | ICD-10-CM | POA: Diagnosis not present

## 2023-01-17 DIAGNOSIS — Z9181 History of falling: Secondary | ICD-10-CM | POA: Diagnosis not present

## 2023-01-17 DIAGNOSIS — E119 Type 2 diabetes mellitus without complications: Secondary | ICD-10-CM | POA: Diagnosis not present

## 2023-01-17 DIAGNOSIS — R2689 Other abnormalities of gait and mobility: Secondary | ICD-10-CM | POA: Diagnosis not present

## 2023-01-17 DIAGNOSIS — R2681 Unsteadiness on feet: Secondary | ICD-10-CM | POA: Diagnosis not present

## 2023-01-17 DIAGNOSIS — M6281 Muscle weakness (generalized): Secondary | ICD-10-CM | POA: Diagnosis not present

## 2023-01-17 DIAGNOSIS — S72091D Other fracture of head and neck of right femur, subsequent encounter for closed fracture with routine healing: Secondary | ICD-10-CM | POA: Diagnosis not present

## 2023-01-17 DIAGNOSIS — I10 Essential (primary) hypertension: Secondary | ICD-10-CM | POA: Diagnosis not present

## 2023-01-17 DIAGNOSIS — D649 Anemia, unspecified: Secondary | ICD-10-CM | POA: Diagnosis not present

## 2023-01-21 DIAGNOSIS — R2681 Unsteadiness on feet: Secondary | ICD-10-CM | POA: Diagnosis not present

## 2023-01-21 DIAGNOSIS — E1165 Type 2 diabetes mellitus with hyperglycemia: Secondary | ICD-10-CM | POA: Diagnosis not present

## 2023-01-21 DIAGNOSIS — R2689 Other abnormalities of gait and mobility: Secondary | ICD-10-CM | POA: Diagnosis not present

## 2023-01-21 DIAGNOSIS — Z471 Aftercare following joint replacement surgery: Secondary | ICD-10-CM | POA: Diagnosis not present

## 2023-01-21 DIAGNOSIS — M6281 Muscle weakness (generalized): Secondary | ICD-10-CM | POA: Diagnosis not present

## 2023-01-21 DIAGNOSIS — E785 Hyperlipidemia, unspecified: Secondary | ICD-10-CM | POA: Diagnosis not present

## 2023-01-21 DIAGNOSIS — Z9181 History of falling: Secondary | ICD-10-CM | POA: Diagnosis not present

## 2023-01-21 DIAGNOSIS — I10 Essential (primary) hypertension: Secondary | ICD-10-CM | POA: Diagnosis not present

## 2023-01-21 DIAGNOSIS — S72091D Other fracture of head and neck of right femur, subsequent encounter for closed fracture with routine healing: Secondary | ICD-10-CM | POA: Diagnosis not present

## 2023-01-22 DIAGNOSIS — I10 Essential (primary) hypertension: Secondary | ICD-10-CM | POA: Diagnosis not present

## 2023-01-23 ENCOUNTER — Other Ambulatory Visit: Payer: Self-pay | Admitting: *Deleted

## 2023-01-23 NOTE — Patient Outreach (Incomplete)
Post-Acute Care Coordinator follow up. Mrs. Boisvert resides in [facility] skilled nursing facility. Screening for potential care coordination services as benefit health plan and PCP.   Spoke with [facility] psychotherapist. Mrs. Bellew from home alone. Transition plans pending. Wilkie Aye waiting to speak with daughter.   Will continue to follow.    Raiford Noble, MSN, RN,BSN [facility] Post Acute Care Coordinator [phone] (Direct dial)

## 2023-01-24 DIAGNOSIS — S72091D Other fracture of head and neck of right femur, subsequent encounter for closed fracture with routine healing: Secondary | ICD-10-CM | POA: Diagnosis not present

## 2023-01-24 DIAGNOSIS — M6281 Muscle weakness (generalized): Secondary | ICD-10-CM | POA: Diagnosis not present

## 2023-01-24 DIAGNOSIS — R2689 Other abnormalities of gait and mobility: Secondary | ICD-10-CM | POA: Diagnosis not present

## 2023-01-24 DIAGNOSIS — Z9181 History of falling: Secondary | ICD-10-CM | POA: Diagnosis not present

## 2023-01-24 DIAGNOSIS — R2681 Unsteadiness on feet: Secondary | ICD-10-CM | POA: Diagnosis not present

## 2023-01-28 DIAGNOSIS — R2681 Unsteadiness on feet: Secondary | ICD-10-CM | POA: Diagnosis not present

## 2023-01-28 DIAGNOSIS — M6281 Muscle weakness (generalized): Secondary | ICD-10-CM | POA: Diagnosis not present

## 2023-01-28 DIAGNOSIS — N179 Acute kidney failure, unspecified: Secondary | ICD-10-CM | POA: Diagnosis not present

## 2023-01-28 DIAGNOSIS — E875 Hyperkalemia: Secondary | ICD-10-CM | POA: Diagnosis not present

## 2023-01-28 DIAGNOSIS — Z9181 History of falling: Secondary | ICD-10-CM | POA: Diagnosis not present

## 2023-01-28 DIAGNOSIS — R2689 Other abnormalities of gait and mobility: Secondary | ICD-10-CM | POA: Diagnosis not present

## 2023-01-28 DIAGNOSIS — S72091D Other fracture of head and neck of right femur, subsequent encounter for closed fracture with routine healing: Secondary | ICD-10-CM | POA: Diagnosis not present

## 2023-01-28 DIAGNOSIS — I10 Essential (primary) hypertension: Secondary | ICD-10-CM | POA: Diagnosis not present

## 2023-01-30 DIAGNOSIS — N179 Acute kidney failure, unspecified: Secondary | ICD-10-CM | POA: Diagnosis not present

## 2023-01-30 DIAGNOSIS — Z471 Aftercare following joint replacement surgery: Secondary | ICD-10-CM | POA: Diagnosis not present

## 2023-01-30 DIAGNOSIS — E875 Hyperkalemia: Secondary | ICD-10-CM | POA: Diagnosis not present

## 2023-01-30 DIAGNOSIS — I10 Essential (primary) hypertension: Secondary | ICD-10-CM | POA: Diagnosis not present

## 2023-01-31 DIAGNOSIS — R2681 Unsteadiness on feet: Secondary | ICD-10-CM | POA: Diagnosis not present

## 2023-01-31 DIAGNOSIS — Z9181 History of falling: Secondary | ICD-10-CM | POA: Diagnosis not present

## 2023-01-31 DIAGNOSIS — M6281 Muscle weakness (generalized): Secondary | ICD-10-CM | POA: Diagnosis not present

## 2023-01-31 DIAGNOSIS — R2689 Other abnormalities of gait and mobility: Secondary | ICD-10-CM | POA: Diagnosis not present

## 2023-01-31 DIAGNOSIS — S72091D Other fracture of head and neck of right femur, subsequent encounter for closed fracture with routine healing: Secondary | ICD-10-CM | POA: Diagnosis not present

## 2023-02-01 DIAGNOSIS — I10 Essential (primary) hypertension: Secondary | ICD-10-CM | POA: Diagnosis not present

## 2023-02-12 DIAGNOSIS — M1711 Unilateral primary osteoarthritis, right knee: Secondary | ICD-10-CM | POA: Diagnosis not present

## 2023-02-12 DIAGNOSIS — I10 Essential (primary) hypertension: Secondary | ICD-10-CM | POA: Diagnosis not present

## 2023-02-12 DIAGNOSIS — K219 Gastro-esophageal reflux disease without esophagitis: Secondary | ICD-10-CM | POA: Diagnosis not present

## 2023-02-12 DIAGNOSIS — S72001A Fracture of unspecified part of neck of right femur, initial encounter for closed fracture: Secondary | ICD-10-CM | POA: Diagnosis not present

## 2023-02-12 DIAGNOSIS — E1140 Type 2 diabetes mellitus with diabetic neuropathy, unspecified: Secondary | ICD-10-CM | POA: Diagnosis not present

## 2023-02-12 DIAGNOSIS — Z79899 Other long term (current) drug therapy: Secondary | ICD-10-CM | POA: Diagnosis not present

## 2023-02-12 DIAGNOSIS — Z853 Personal history of malignant neoplasm of breast: Secondary | ICD-10-CM | POA: Diagnosis not present

## 2023-02-12 DIAGNOSIS — R600 Localized edema: Secondary | ICD-10-CM | POA: Diagnosis not present

## 2023-02-14 DIAGNOSIS — S72001D Fracture of unspecified part of neck of right femur, subsequent encounter for closed fracture with routine healing: Secondary | ICD-10-CM | POA: Diagnosis not present

## 2023-02-14 DIAGNOSIS — E119 Type 2 diabetes mellitus without complications: Secondary | ICD-10-CM | POA: Diagnosis not present

## 2023-02-14 DIAGNOSIS — K219 Gastro-esophageal reflux disease without esophagitis: Secondary | ICD-10-CM | POA: Diagnosis not present

## 2023-02-14 DIAGNOSIS — Z7982 Long term (current) use of aspirin: Secondary | ICD-10-CM | POA: Diagnosis not present

## 2023-02-14 DIAGNOSIS — M159 Polyosteoarthritis, unspecified: Secondary | ICD-10-CM | POA: Diagnosis not present

## 2023-02-14 DIAGNOSIS — D649 Anemia, unspecified: Secondary | ICD-10-CM | POA: Diagnosis not present

## 2023-02-14 DIAGNOSIS — S51011D Laceration without foreign body of right elbow, subsequent encounter: Secondary | ICD-10-CM | POA: Diagnosis not present

## 2023-02-14 DIAGNOSIS — S72001A Fracture of unspecified part of neck of right femur, initial encounter for closed fracture: Secondary | ICD-10-CM | POA: Diagnosis not present

## 2023-02-14 DIAGNOSIS — Z96641 Presence of right artificial hip joint: Secondary | ICD-10-CM | POA: Diagnosis not present

## 2023-02-14 DIAGNOSIS — Z7984 Long term (current) use of oral hypoglycemic drugs: Secondary | ICD-10-CM | POA: Diagnosis not present

## 2023-02-14 DIAGNOSIS — Z853 Personal history of malignant neoplasm of breast: Secondary | ICD-10-CM | POA: Diagnosis not present

## 2023-02-14 DIAGNOSIS — Z9181 History of falling: Secondary | ICD-10-CM | POA: Diagnosis not present

## 2023-02-14 DIAGNOSIS — Z556 Problems related to health literacy: Secondary | ICD-10-CM | POA: Diagnosis not present

## 2023-02-14 DIAGNOSIS — E785 Hyperlipidemia, unspecified: Secondary | ICD-10-CM | POA: Diagnosis not present

## 2023-02-14 DIAGNOSIS — I10 Essential (primary) hypertension: Secondary | ICD-10-CM | POA: Diagnosis not present

## 2023-02-20 DIAGNOSIS — Z556 Problems related to health literacy: Secondary | ICD-10-CM | POA: Diagnosis not present

## 2023-02-20 DIAGNOSIS — Z7984 Long term (current) use of oral hypoglycemic drugs: Secondary | ICD-10-CM | POA: Diagnosis not present

## 2023-02-20 DIAGNOSIS — Z853 Personal history of malignant neoplasm of breast: Secondary | ICD-10-CM | POA: Diagnosis not present

## 2023-02-20 DIAGNOSIS — K219 Gastro-esophageal reflux disease without esophagitis: Secondary | ICD-10-CM | POA: Diagnosis not present

## 2023-02-20 DIAGNOSIS — E785 Hyperlipidemia, unspecified: Secondary | ICD-10-CM | POA: Diagnosis not present

## 2023-02-20 DIAGNOSIS — I10 Essential (primary) hypertension: Secondary | ICD-10-CM | POA: Diagnosis not present

## 2023-02-20 DIAGNOSIS — S51011D Laceration without foreign body of right elbow, subsequent encounter: Secondary | ICD-10-CM | POA: Diagnosis not present

## 2023-02-20 DIAGNOSIS — D649 Anemia, unspecified: Secondary | ICD-10-CM | POA: Diagnosis not present

## 2023-02-20 DIAGNOSIS — S72001A Fracture of unspecified part of neck of right femur, initial encounter for closed fracture: Secondary | ICD-10-CM | POA: Diagnosis not present

## 2023-02-20 DIAGNOSIS — E119 Type 2 diabetes mellitus without complications: Secondary | ICD-10-CM | POA: Diagnosis not present

## 2023-02-20 DIAGNOSIS — S72001D Fracture of unspecified part of neck of right femur, subsequent encounter for closed fracture with routine healing: Secondary | ICD-10-CM | POA: Diagnosis not present

## 2023-02-20 DIAGNOSIS — M159 Polyosteoarthritis, unspecified: Secondary | ICD-10-CM | POA: Diagnosis not present

## 2023-02-20 DIAGNOSIS — Z96641 Presence of right artificial hip joint: Secondary | ICD-10-CM | POA: Diagnosis not present

## 2023-02-20 DIAGNOSIS — Z7982 Long term (current) use of aspirin: Secondary | ICD-10-CM | POA: Diagnosis not present

## 2023-02-20 DIAGNOSIS — Z9181 History of falling: Secondary | ICD-10-CM | POA: Diagnosis not present

## 2023-02-21 DIAGNOSIS — S72001A Fracture of unspecified part of neck of right femur, initial encounter for closed fracture: Secondary | ICD-10-CM | POA: Diagnosis not present

## 2023-02-21 DIAGNOSIS — Z7982 Long term (current) use of aspirin: Secondary | ICD-10-CM | POA: Diagnosis not present

## 2023-02-21 DIAGNOSIS — Z9989 Dependence on other enabling machines and devices: Secondary | ICD-10-CM | POA: Diagnosis not present

## 2023-02-21 DIAGNOSIS — Z853 Personal history of malignant neoplasm of breast: Secondary | ICD-10-CM | POA: Diagnosis not present

## 2023-02-21 DIAGNOSIS — E1122 Type 2 diabetes mellitus with diabetic chronic kidney disease: Secondary | ICD-10-CM | POA: Diagnosis not present

## 2023-02-21 DIAGNOSIS — E7800 Pure hypercholesterolemia, unspecified: Secondary | ICD-10-CM | POA: Diagnosis not present

## 2023-02-21 DIAGNOSIS — Z7984 Long term (current) use of oral hypoglycemic drugs: Secondary | ICD-10-CM | POA: Diagnosis not present

## 2023-02-21 DIAGNOSIS — N1831 Chronic kidney disease, stage 3a: Secondary | ICD-10-CM | POA: Diagnosis not present

## 2023-02-21 DIAGNOSIS — M179 Osteoarthritis of knee, unspecified: Secondary | ICD-10-CM | POA: Diagnosis not present

## 2023-02-21 DIAGNOSIS — Z96641 Presence of right artificial hip joint: Secondary | ICD-10-CM | POA: Diagnosis not present

## 2023-02-21 DIAGNOSIS — Z9181 History of falling: Secondary | ICD-10-CM | POA: Diagnosis not present

## 2023-02-21 DIAGNOSIS — D638 Anemia in other chronic diseases classified elsewhere: Secondary | ICD-10-CM | POA: Diagnosis not present

## 2023-02-21 DIAGNOSIS — S72001D Fracture of unspecified part of neck of right femur, subsequent encounter for closed fracture with routine healing: Secondary | ICD-10-CM | POA: Diagnosis not present

## 2023-02-21 DIAGNOSIS — I272 Pulmonary hypertension, unspecified: Secondary | ICD-10-CM | POA: Diagnosis not present

## 2023-02-21 DIAGNOSIS — K219 Gastro-esophageal reflux disease without esophagitis: Secondary | ICD-10-CM | POA: Diagnosis not present

## 2023-02-21 DIAGNOSIS — E785 Hyperlipidemia, unspecified: Secondary | ICD-10-CM | POA: Diagnosis not present

## 2023-02-21 DIAGNOSIS — E113293 Type 2 diabetes mellitus with mild nonproliferative diabetic retinopathy without macular edema, bilateral: Secondary | ICD-10-CM | POA: Diagnosis not present

## 2023-02-21 DIAGNOSIS — Z556 Problems related to health literacy: Secondary | ICD-10-CM | POA: Diagnosis not present

## 2023-02-21 DIAGNOSIS — S51011D Laceration without foreign body of right elbow, subsequent encounter: Secondary | ICD-10-CM | POA: Diagnosis not present

## 2023-02-21 DIAGNOSIS — I10 Essential (primary) hypertension: Secondary | ICD-10-CM | POA: Diagnosis not present

## 2023-02-21 DIAGNOSIS — M159 Polyosteoarthritis, unspecified: Secondary | ICD-10-CM | POA: Diagnosis not present

## 2023-02-21 DIAGNOSIS — D649 Anemia, unspecified: Secondary | ICD-10-CM | POA: Diagnosis not present

## 2023-02-21 DIAGNOSIS — E119 Type 2 diabetes mellitus without complications: Secondary | ICD-10-CM | POA: Diagnosis not present

## 2023-02-21 DIAGNOSIS — N182 Chronic kidney disease, stage 2 (mild): Secondary | ICD-10-CM | POA: Diagnosis not present

## 2023-02-26 DIAGNOSIS — S72031D Displaced midcervical fracture of right femur, subsequent encounter for closed fracture with routine healing: Secondary | ICD-10-CM | POA: Diagnosis not present

## 2023-02-26 DIAGNOSIS — Z471 Aftercare following joint replacement surgery: Secondary | ICD-10-CM | POA: Diagnosis not present

## 2023-02-26 DIAGNOSIS — M25561 Pain in right knee: Secondary | ICD-10-CM | POA: Diagnosis not present

## 2023-02-27 DIAGNOSIS — K219 Gastro-esophageal reflux disease without esophagitis: Secondary | ICD-10-CM | POA: Diagnosis not present

## 2023-02-27 DIAGNOSIS — D649 Anemia, unspecified: Secondary | ICD-10-CM | POA: Diagnosis not present

## 2023-02-27 DIAGNOSIS — E119 Type 2 diabetes mellitus without complications: Secondary | ICD-10-CM | POA: Diagnosis not present

## 2023-02-27 DIAGNOSIS — S51011D Laceration without foreign body of right elbow, subsequent encounter: Secondary | ICD-10-CM | POA: Diagnosis not present

## 2023-02-27 DIAGNOSIS — Z9181 History of falling: Secondary | ICD-10-CM | POA: Diagnosis not present

## 2023-02-27 DIAGNOSIS — Z96641 Presence of right artificial hip joint: Secondary | ICD-10-CM | POA: Diagnosis not present

## 2023-02-27 DIAGNOSIS — Z7982 Long term (current) use of aspirin: Secondary | ICD-10-CM | POA: Diagnosis not present

## 2023-02-27 DIAGNOSIS — Z7984 Long term (current) use of oral hypoglycemic drugs: Secondary | ICD-10-CM | POA: Diagnosis not present

## 2023-02-27 DIAGNOSIS — M159 Polyosteoarthritis, unspecified: Secondary | ICD-10-CM | POA: Diagnosis not present

## 2023-02-27 DIAGNOSIS — E785 Hyperlipidemia, unspecified: Secondary | ICD-10-CM | POA: Diagnosis not present

## 2023-02-27 DIAGNOSIS — S72001A Fracture of unspecified part of neck of right femur, initial encounter for closed fracture: Secondary | ICD-10-CM | POA: Diagnosis not present

## 2023-02-27 DIAGNOSIS — I10 Essential (primary) hypertension: Secondary | ICD-10-CM | POA: Diagnosis not present

## 2023-02-27 DIAGNOSIS — Z556 Problems related to health literacy: Secondary | ICD-10-CM | POA: Diagnosis not present

## 2023-02-27 DIAGNOSIS — S72001D Fracture of unspecified part of neck of right femur, subsequent encounter for closed fracture with routine healing: Secondary | ICD-10-CM | POA: Diagnosis not present

## 2023-02-27 DIAGNOSIS — Z853 Personal history of malignant neoplasm of breast: Secondary | ICD-10-CM | POA: Diagnosis not present

## 2023-02-28 DIAGNOSIS — M1990 Unspecified osteoarthritis, unspecified site: Secondary | ICD-10-CM | POA: Diagnosis not present

## 2023-02-28 DIAGNOSIS — Z7982 Long term (current) use of aspirin: Secondary | ICD-10-CM | POA: Diagnosis not present

## 2023-02-28 DIAGNOSIS — Z9181 History of falling: Secondary | ICD-10-CM | POA: Diagnosis not present

## 2023-02-28 DIAGNOSIS — K219 Gastro-esophageal reflux disease without esophagitis: Secondary | ICD-10-CM | POA: Diagnosis not present

## 2023-02-28 DIAGNOSIS — I10 Essential (primary) hypertension: Secondary | ICD-10-CM | POA: Diagnosis not present

## 2023-02-28 DIAGNOSIS — Z96641 Presence of right artificial hip joint: Secondary | ICD-10-CM | POA: Diagnosis not present

## 2023-02-28 DIAGNOSIS — M159 Polyosteoarthritis, unspecified: Secondary | ICD-10-CM | POA: Diagnosis not present

## 2023-02-28 DIAGNOSIS — S51011D Laceration without foreign body of right elbow, subsequent encounter: Secondary | ICD-10-CM | POA: Diagnosis not present

## 2023-02-28 DIAGNOSIS — E785 Hyperlipidemia, unspecified: Secondary | ICD-10-CM | POA: Diagnosis not present

## 2023-02-28 DIAGNOSIS — S72001D Fracture of unspecified part of neck of right femur, subsequent encounter for closed fracture with routine healing: Secondary | ICD-10-CM | POA: Diagnosis not present

## 2023-02-28 DIAGNOSIS — D649 Anemia, unspecified: Secondary | ICD-10-CM | POA: Diagnosis not present

## 2023-02-28 DIAGNOSIS — E119 Type 2 diabetes mellitus without complications: Secondary | ICD-10-CM | POA: Diagnosis not present

## 2023-02-28 DIAGNOSIS — S72001A Fracture of unspecified part of neck of right femur, initial encounter for closed fracture: Secondary | ICD-10-CM | POA: Diagnosis not present

## 2023-02-28 DIAGNOSIS — Z556 Problems related to health literacy: Secondary | ICD-10-CM | POA: Diagnosis not present

## 2023-02-28 DIAGNOSIS — Z7984 Long term (current) use of oral hypoglycemic drugs: Secondary | ICD-10-CM | POA: Diagnosis not present

## 2023-02-28 DIAGNOSIS — Z853 Personal history of malignant neoplasm of breast: Secondary | ICD-10-CM | POA: Diagnosis not present

## 2023-03-06 DIAGNOSIS — S72001D Fracture of unspecified part of neck of right femur, subsequent encounter for closed fracture with routine healing: Secondary | ICD-10-CM | POA: Diagnosis not present

## 2023-03-06 DIAGNOSIS — K219 Gastro-esophageal reflux disease without esophagitis: Secondary | ICD-10-CM | POA: Diagnosis not present

## 2023-03-06 DIAGNOSIS — Z96641 Presence of right artificial hip joint: Secondary | ICD-10-CM | POA: Diagnosis not present

## 2023-03-06 DIAGNOSIS — Z9181 History of falling: Secondary | ICD-10-CM | POA: Diagnosis not present

## 2023-03-06 DIAGNOSIS — Z7984 Long term (current) use of oral hypoglycemic drugs: Secondary | ICD-10-CM | POA: Diagnosis not present

## 2023-03-06 DIAGNOSIS — I10 Essential (primary) hypertension: Secondary | ICD-10-CM | POA: Diagnosis not present

## 2023-03-06 DIAGNOSIS — M159 Polyosteoarthritis, unspecified: Secondary | ICD-10-CM | POA: Diagnosis not present

## 2023-03-06 DIAGNOSIS — S72001A Fracture of unspecified part of neck of right femur, initial encounter for closed fracture: Secondary | ICD-10-CM | POA: Diagnosis not present

## 2023-03-06 DIAGNOSIS — Z853 Personal history of malignant neoplasm of breast: Secondary | ICD-10-CM | POA: Diagnosis not present

## 2023-03-06 DIAGNOSIS — D649 Anemia, unspecified: Secondary | ICD-10-CM | POA: Diagnosis not present

## 2023-03-06 DIAGNOSIS — E119 Type 2 diabetes mellitus without complications: Secondary | ICD-10-CM | POA: Diagnosis not present

## 2023-03-06 DIAGNOSIS — Z7982 Long term (current) use of aspirin: Secondary | ICD-10-CM | POA: Diagnosis not present

## 2023-03-06 DIAGNOSIS — Z556 Problems related to health literacy: Secondary | ICD-10-CM | POA: Diagnosis not present

## 2023-03-06 DIAGNOSIS — E785 Hyperlipidemia, unspecified: Secondary | ICD-10-CM | POA: Diagnosis not present

## 2023-03-06 DIAGNOSIS — S51011D Laceration without foreign body of right elbow, subsequent encounter: Secondary | ICD-10-CM | POA: Diagnosis not present

## 2023-03-08 DIAGNOSIS — E785 Hyperlipidemia, unspecified: Secondary | ICD-10-CM | POA: Diagnosis not present

## 2023-03-08 DIAGNOSIS — Z556 Problems related to health literacy: Secondary | ICD-10-CM | POA: Diagnosis not present

## 2023-03-08 DIAGNOSIS — S72001D Fracture of unspecified part of neck of right femur, subsequent encounter for closed fracture with routine healing: Secondary | ICD-10-CM | POA: Diagnosis not present

## 2023-03-08 DIAGNOSIS — K219 Gastro-esophageal reflux disease without esophagitis: Secondary | ICD-10-CM | POA: Diagnosis not present

## 2023-03-08 DIAGNOSIS — Z7982 Long term (current) use of aspirin: Secondary | ICD-10-CM | POA: Diagnosis not present

## 2023-03-08 DIAGNOSIS — S72001A Fracture of unspecified part of neck of right femur, initial encounter for closed fracture: Secondary | ICD-10-CM | POA: Diagnosis not present

## 2023-03-08 DIAGNOSIS — Z9181 History of falling: Secondary | ICD-10-CM | POA: Diagnosis not present

## 2023-03-08 DIAGNOSIS — I10 Essential (primary) hypertension: Secondary | ICD-10-CM | POA: Diagnosis not present

## 2023-03-08 DIAGNOSIS — Z7984 Long term (current) use of oral hypoglycemic drugs: Secondary | ICD-10-CM | POA: Diagnosis not present

## 2023-03-08 DIAGNOSIS — S51011D Laceration without foreign body of right elbow, subsequent encounter: Secondary | ICD-10-CM | POA: Diagnosis not present

## 2023-03-08 DIAGNOSIS — Z853 Personal history of malignant neoplasm of breast: Secondary | ICD-10-CM | POA: Diagnosis not present

## 2023-03-08 DIAGNOSIS — E119 Type 2 diabetes mellitus without complications: Secondary | ICD-10-CM | POA: Diagnosis not present

## 2023-03-08 DIAGNOSIS — M159 Polyosteoarthritis, unspecified: Secondary | ICD-10-CM | POA: Diagnosis not present

## 2023-03-08 DIAGNOSIS — D649 Anemia, unspecified: Secondary | ICD-10-CM | POA: Diagnosis not present

## 2023-03-08 DIAGNOSIS — Z96641 Presence of right artificial hip joint: Secondary | ICD-10-CM | POA: Diagnosis not present

## 2023-03-19 ENCOUNTER — Other Ambulatory Visit: Payer: Self-pay | Admitting: Family Medicine

## 2023-03-19 ENCOUNTER — Ambulatory Visit
Admission: RE | Admit: 2023-03-19 | Discharge: 2023-03-19 | Disposition: A | Discharge status: Home / Self Care | Payer: Medicare Other | Source: Ambulatory Visit | Attending: Family Medicine | Admitting: Family Medicine

## 2023-03-19 DIAGNOSIS — R0602 Shortness of breath: Secondary | ICD-10-CM

## 2023-03-20 ENCOUNTER — Other Ambulatory Visit (HOSPITAL_COMMUNITY): Payer: Self-pay | Admitting: Family Medicine

## 2023-03-20 DIAGNOSIS — I10 Essential (primary) hypertension: Secondary | ICD-10-CM

## 2023-03-22 ENCOUNTER — Ambulatory Visit (HOSPITAL_COMMUNITY)
Admission: RE | Admit: 2023-03-22 | Discharge: 2023-03-22 | Disposition: A | Discharge status: Home / Self Care | Payer: Medicare Other | Source: Ambulatory Visit | Attending: Family Medicine | Admitting: Family Medicine

## 2023-03-22 DIAGNOSIS — I080 Rheumatic disorders of both mitral and aortic valves: Secondary | ICD-10-CM | POA: Diagnosis not present

## 2023-03-22 DIAGNOSIS — I10 Essential (primary) hypertension: Secondary | ICD-10-CM | POA: Diagnosis not present

## 2023-03-22 DIAGNOSIS — E119 Type 2 diabetes mellitus without complications: Secondary | ICD-10-CM | POA: Insufficient documentation

## 2023-03-22 DIAGNOSIS — R0602 Shortness of breath: Secondary | ICD-10-CM | POA: Insufficient documentation

## 2023-03-22 DIAGNOSIS — Z853 Personal history of malignant neoplasm of breast: Secondary | ICD-10-CM | POA: Insufficient documentation

## 2023-03-22 DIAGNOSIS — R609 Edema, unspecified: Secondary | ICD-10-CM | POA: Diagnosis present

## 2023-03-22 LAB — ECHOCARDIOGRAM COMPLETE
Area-P 1/2: 3.17 cm2
Calc EF: 54.6 %
S' Lateral: 3.2 cm
Single Plane A2C EF: 51.8 %
Single Plane A4C EF: 57 %

## 2023-03-22 NOTE — Progress Notes (Signed)
  Echocardiogram 2D Echocardiogram has been performed.  Janalyn Harder 03/22/2023, 1:51 PM

## 2023-04-02 ENCOUNTER — Emergency Department (HOSPITAL_BASED_OUTPATIENT_CLINIC_OR_DEPARTMENT_OTHER): Payer: Medicare Other

## 2023-04-02 ENCOUNTER — Inpatient Hospital Stay (HOSPITAL_BASED_OUTPATIENT_CLINIC_OR_DEPARTMENT_OTHER)
Admission: EM | Admit: 2023-04-02 | Discharge: 2023-04-09 | DRG: 177 | Disposition: A | Discharge status: Home Health | Payer: Medicare Other | Source: Skilled Nursing Facility | Attending: Internal Medicine | Admitting: Internal Medicine

## 2023-04-02 ENCOUNTER — Other Ambulatory Visit: Payer: Self-pay

## 2023-04-02 DIAGNOSIS — I130 Hypertensive heart and chronic kidney disease with heart failure and stage 1 through stage 4 chronic kidney disease, or unspecified chronic kidney disease: Secondary | ICD-10-CM | POA: Diagnosis present

## 2023-04-02 DIAGNOSIS — E876 Hypokalemia: Secondary | ICD-10-CM | POA: Diagnosis present

## 2023-04-02 DIAGNOSIS — E669 Obesity, unspecified: Secondary | ICD-10-CM | POA: Diagnosis present

## 2023-04-02 DIAGNOSIS — K219 Gastro-esophageal reflux disease without esophagitis: Secondary | ICD-10-CM | POA: Diagnosis present

## 2023-04-02 DIAGNOSIS — R569 Unspecified convulsions: Secondary | ICD-10-CM | POA: Diagnosis not present

## 2023-04-02 DIAGNOSIS — I10 Essential (primary) hypertension: Secondary | ICD-10-CM | POA: Diagnosis not present

## 2023-04-02 DIAGNOSIS — E785 Hyperlipidemia, unspecified: Secondary | ICD-10-CM | POA: Diagnosis present

## 2023-04-02 DIAGNOSIS — R001 Bradycardia, unspecified: Secondary | ICD-10-CM | POA: Diagnosis present

## 2023-04-02 DIAGNOSIS — Z923 Personal history of irradiation: Secondary | ICD-10-CM | POA: Diagnosis not present

## 2023-04-02 DIAGNOSIS — Z79811 Long term (current) use of aromatase inhibitors: Secondary | ICD-10-CM | POA: Diagnosis not present

## 2023-04-02 DIAGNOSIS — J9601 Acute respiratory failure with hypoxia: Secondary | ICD-10-CM | POA: Diagnosis present

## 2023-04-02 DIAGNOSIS — Z888 Allergy status to other drugs, medicaments and biological substances status: Secondary | ICD-10-CM | POA: Diagnosis not present

## 2023-04-02 DIAGNOSIS — Z885 Allergy status to narcotic agent status: Secondary | ICD-10-CM

## 2023-04-02 DIAGNOSIS — I5033 Acute on chronic diastolic (congestive) heart failure: Secondary | ICD-10-CM | POA: Diagnosis present

## 2023-04-02 DIAGNOSIS — N1831 Chronic kidney disease, stage 3a: Secondary | ICD-10-CM | POA: Diagnosis present

## 2023-04-02 DIAGNOSIS — Z882 Allergy status to sulfonamides status: Secondary | ICD-10-CM | POA: Diagnosis not present

## 2023-04-02 DIAGNOSIS — E1122 Type 2 diabetes mellitus with diabetic chronic kidney disease: Secondary | ICD-10-CM | POA: Diagnosis present

## 2023-04-02 DIAGNOSIS — E1165 Type 2 diabetes mellitus with hyperglycemia: Secondary | ICD-10-CM | POA: Diagnosis present

## 2023-04-02 DIAGNOSIS — Z96641 Presence of right artificial hip joint: Secondary | ICD-10-CM | POA: Diagnosis present

## 2023-04-02 DIAGNOSIS — T502X5A Adverse effect of carbonic-anhydrase inhibitors, benzothiadiazides and other diuretics, initial encounter: Secondary | ICD-10-CM | POA: Diagnosis present

## 2023-04-02 DIAGNOSIS — I509 Heart failure, unspecified: Secondary | ICD-10-CM

## 2023-04-02 DIAGNOSIS — R55 Syncope and collapse: Secondary | ICD-10-CM | POA: Diagnosis present

## 2023-04-02 DIAGNOSIS — Z800 Family history of malignant neoplasm of digestive organs: Secondary | ICD-10-CM

## 2023-04-02 DIAGNOSIS — I161 Hypertensive emergency: Secondary | ICD-10-CM | POA: Diagnosis present

## 2023-04-02 DIAGNOSIS — Z79899 Other long term (current) drug therapy: Secondary | ICD-10-CM | POA: Diagnosis not present

## 2023-04-02 DIAGNOSIS — J9811 Atelectasis: Secondary | ICD-10-CM | POA: Diagnosis present

## 2023-04-02 DIAGNOSIS — Z7982 Long term (current) use of aspirin: Secondary | ICD-10-CM | POA: Diagnosis not present

## 2023-04-02 DIAGNOSIS — Z7984 Long term (current) use of oral hypoglycemic drugs: Secondary | ICD-10-CM

## 2023-04-02 DIAGNOSIS — J690 Pneumonitis due to inhalation of food and vomit: Principal | ICD-10-CM | POA: Diagnosis present

## 2023-04-02 DIAGNOSIS — Z6833 Body mass index (BMI) 33.0-33.9, adult: Secondary | ICD-10-CM | POA: Diagnosis not present

## 2023-04-02 DIAGNOSIS — J189 Pneumonia, unspecified organism: Secondary | ICD-10-CM

## 2023-04-02 DIAGNOSIS — Z820 Family history of epilepsy and other diseases of the nervous system: Secondary | ICD-10-CM

## 2023-04-02 DIAGNOSIS — Z853 Personal history of malignant neoplasm of breast: Secondary | ICD-10-CM

## 2023-04-02 DIAGNOSIS — R7989 Other specified abnormal findings of blood chemistry: Secondary | ICD-10-CM | POA: Diagnosis present

## 2023-04-02 DIAGNOSIS — Z1501 Genetic susceptibility to malignant neoplasm of breast: Secondary | ICD-10-CM

## 2023-04-02 LAB — DIFFERENTIAL
Abs Immature Granulocytes: 0.05 10*3/uL (ref 0.00–0.07)
Basophils Absolute: 0 10*3/uL (ref 0.0–0.1)
Basophils Relative: 0 %
Eosinophils Absolute: 0.1 10*3/uL (ref 0.0–0.5)
Eosinophils Relative: 1 %
Immature Granulocytes: 1 %
Lymphocytes Relative: 9 %
Lymphs Abs: 0.6 10*3/uL — ABNORMAL LOW (ref 0.7–4.0)
Monocytes Absolute: 0.5 10*3/uL (ref 0.1–1.0)
Monocytes Relative: 7 %
Neutro Abs: 6 10*3/uL (ref 1.7–7.7)
Neutrophils Relative %: 82 %

## 2023-04-02 LAB — CBG MONITORING, ED: Glucose-Capillary: 199 mg/dL — ABNORMAL HIGH (ref 70–99)

## 2023-04-02 LAB — CBC
HCT: 28.3 % — ABNORMAL LOW (ref 36.0–46.0)
Hemoglobin: 8.6 g/dL — ABNORMAL LOW (ref 12.0–15.0)
MCH: 22.7 pg — ABNORMAL LOW (ref 26.0–34.0)
MCHC: 30.4 g/dL (ref 30.0–36.0)
MCV: 74.7 fL — ABNORMAL LOW (ref 80.0–100.0)
Platelets: 270 10*3/uL (ref 150–400)
RBC: 3.79 MIL/uL — ABNORMAL LOW (ref 3.87–5.11)
RDW: 16.8 % — ABNORMAL HIGH (ref 11.5–15.5)
WBC: 7.3 10*3/uL (ref 4.0–10.5)
nRBC: 0 % (ref 0.0–0.2)

## 2023-04-02 LAB — COMPREHENSIVE METABOLIC PANEL
ALT: 20 U/L (ref 0–44)
AST: 14 U/L — ABNORMAL LOW (ref 15–41)
Albumin: 3.8 g/dL (ref 3.5–5.0)
Alkaline Phosphatase: 130 U/L — ABNORMAL HIGH (ref 38–126)
Anion gap: 10 (ref 5–15)
BUN: 33 mg/dL — ABNORMAL HIGH (ref 8–23)
CO2: 27 mmol/L (ref 22–32)
Calcium: 9.3 mg/dL (ref 8.9–10.3)
Chloride: 102 mmol/L (ref 98–111)
Creatinine, Ser: 1.2 mg/dL — ABNORMAL HIGH (ref 0.44–1.00)
GFR, Estimated: 44 mL/min — ABNORMAL LOW (ref >60)
Glucose, Bld: 238 mg/dL — ABNORMAL HIGH (ref 70–99)
Potassium: 4.4 mmol/L (ref 3.5–5.1)
Sodium: 139 mmol/L (ref 135–145)
Total Bilirubin: 0.6 mg/dL (ref 0.3–1.2)
Total Protein: 6 g/dL — ABNORMAL LOW (ref 6.5–8.1)

## 2023-04-02 LAB — PROTIME-INR
INR: 1.1 (ref 0.8–1.2)
Prothrombin Time: 14.6 seconds (ref 11.4–15.2)

## 2023-04-02 LAB — ETHANOL: Alcohol, Ethyl (B): <10 mg/dL (ref <10)

## 2023-04-02 LAB — BRAIN NATRIURETIC PEPTIDE: B Natriuretic Peptide: 1788.7 pg/mL — ABNORMAL HIGH (ref 0.0–100.0)

## 2023-04-02 LAB — APTT: aPTT: 29 seconds (ref 24–36)

## 2023-04-02 LAB — TROPONIN I (HIGH SENSITIVITY): Troponin I (High Sensitivity): 15 ng/L (ref <18)

## 2023-04-02 MED ORDER — SODIUM CHLORIDE 0.9 % IV SOLN
100.0000 mg | Freq: Once | INTRAVENOUS | Status: AC
  Administered 2023-04-02: 100 mg via INTRAVENOUS
  Filled 2023-04-02: qty 100

## 2023-04-02 MED ORDER — SODIUM CHLORIDE 0.9 % IV SOLN
1.0000 g | Freq: Once | INTRAVENOUS | Status: AC
  Administered 2023-04-02: 1 g via INTRAVENOUS
  Filled 2023-04-02: qty 10

## 2023-04-02 MED ORDER — IOHEXOL 350 MG/ML SOLN
100.0000 mL | Freq: Once | INTRAVENOUS | Status: AC | PRN
  Administered 2023-04-02: 75 mL via INTRAVENOUS

## 2023-04-02 MED ORDER — HYDRALAZINE HCL 20 MG/ML IJ SOLN
10.0000 mg | Freq: Once | INTRAMUSCULAR | Status: AC
  Administered 2023-04-02: 10 mg via INTRAVENOUS
  Filled 2023-04-02: qty 1

## 2023-04-02 MED ORDER — FUROSEMIDE 10 MG/ML IJ SOLN
40.0000 mg | Freq: Once | INTRAMUSCULAR | Status: AC
  Administered 2023-04-02: 40 mg via INTRAVENOUS
  Filled 2023-04-02: qty 4

## 2023-04-02 NOTE — ED Notes (Signed)
Patient transported to CT with radiology staff and RN

## 2023-04-02 NOTE — Progress Notes (Addendum)
Plan of Care Note for accepted transfer   Patient: Laurie Fox MRN: [id number]   DOA: 04/02/2023  Facility requesting transfer: [facility]   Requesting Provider: Dr. Renaye Rakers   Reason for transfer: Transient LOC, hypertensive crisis, acute hypoxic respiratory failure   Facility course: 89 yr old lady with hx of breast cancer, HTN, HLD, DM, CKD 3A, and chronic diastolic CHF presents after a brief episode of drooling and unresponsiveness and is found to be hypoxic and severely hypertensive.   She is afebrile with sat mid-80s on rm air and SBP as high as 230. Notable labs include SCr 1.20 and BNP 1789. No acute findings on head CT. CTA chest negative for PE but notable for small bilateral pleural effusions and atelectasis vs pneumonia.   She was treated with IV Lasix, IV hydralazine, Rocephin, and doxycycline in the ED.    Plan of care: The patient is accepted for admission to [facility] unit, at [facility] or Progressive unit at [facility].   Author: Briscoe Deutscher, MD 04/02/2023  Check [email or web address] for on-call coverage.  Nursing staff, Please call [facility] Admits & Consults System-Wide number on Amion as soon as patient's arrival, so appropriate admitting provider can evaluate the pt.

## 2023-04-02 NOTE — ED Notes (Signed)
RT to CT scan. SpO2 84-85% on Room Air. Patient placed on 4L [place] at this time. Will titrate as able.

## 2023-04-02 NOTE — Plan of Care (Signed)

## 2023-04-02 NOTE — ED Triage Notes (Addendum)
Pt to ED accompanied with daughter from [facility]. Pt around 1130 was at PCP office, When she had a sudden change in status, she slumped in chair, started drooling, "face drop" and was unable to speak. Pt daughter reports this lasted aprox 3 mins and resolved, pt was told at nursing home facility that bp was elevated, so came to ED for evaluation. Per daughter pt orientation is at baseline. Not on blood thinner.    HX HTN

## 2023-04-02 NOTE — ED Provider Notes (Signed)
[facility] AT [facility] Provider Note   CSN: [id number] Arrival date & time: 04/02/23  1839     History  Chief Complaint  Patient presents with   Hypertension    Laurie Fox is a 89 y.o. female with history of cancer on estrogen hormone, mild hypertension, presenting from home in the company of her daughter with concern for altered mental status, confusion, garbled speech.  Patient reportedly was at the PCP office this afternoon where she "slumped over in her chair, was drooling, facial droop and difficulty speaking.  Her daughter reports this lasted about 3 minutes and then resolved.  Her patient's blood pressure was high at the office he was sent into the ED for further evaluation.  The patient is not on anticoagulation.  No history of DVT or PE  HPI     Home Medications Prior to Admission medications   Medication Sig Start Date End Date Taking? Authorizing Provider  acetaminophen (TYLENOL) 500 MG tablet Take 1,000 mg by mouth every 8 (eight) hours as needed for moderate pain, fever or headache.    [provider]  anastrozole (ARIMIDEX) 1 MG tablet TAKE 1 TABLET BY MOUTH  DAILY Patient not taking: Reported on 01/10/2023 10/13/20   Serena Croissant, MD  ASCORBIC ACID PO Take 1 tablet by mouth daily. Vitamin C, unknown strength.    [provider]  carvedilol (COREG) 25 MG tablet Take 25 mg by mouth 2 (two) times daily with a meal.    [provider]  furosemide (LASIX) 40 MG tablet Take 40 mg by mouth daily.    [provider]  Lansoprazole (PREVACID 24HR PO) Take 1 capsule by mouth daily.    [provider]  Multiple Vitamins-Minerals (ONE-A-DAY WOMENS 50+) TABS Take 1 tablet by mouth daily in the afternoon.    [provider]  omeprazole (PRILOSEC) 20 MG capsule Take 1 capsule (20 mg total) by mouth daily. 01/14/23 02/13/23  Hughie Closs, MD  potassium chloride (KLOR-CON) 10 MEQ tablet Take 10 mEq  by mouth daily.    [provider]  simvastatin (ZOCOR) 20 MG tablet Take 20 mg by mouth daily.    [provider]  telmisartan-hydrochlorothiazide (MICARDIS HCT) 40-12.5 MG per tablet Take 1 tablet by mouth daily.    [provider]      Allergies    Codeine and Sulfa antibiotics    Review of Systems   Review of Systems  Physical Exam Updated Vital Signs BP (!) 186/61   Pulse (!) 57   Temp 98.5 F (36.9 C) (Oral)   Resp (!) 22   Ht 5\' 2"  (1.575 m)   Wt 77.1 kg   SpO2 99%   BMI 31.09 kg/m  Physical Exam Constitutional:      General: She is not in acute distress. HENT:     Head: Normocephalic and atraumatic.  Eyes:     Conjunctiva/sclera: Conjunctivae normal.     Pupils: Pupils are equal, round, and reactive to light.  Cardiovascular:     Rate and Rhythm: Regular rhythm. Bradycardia present.     Pulses: Normal pulses.  Pulmonary:     Effort: Pulmonary effort is normal. No respiratory distress.     Breath sounds: Normal breath sounds.  Abdominal:     General: There is no distension.     Tenderness: There is no abdominal tenderness.  Skin:    General: Skin is warm and dry.  Neurological:     General:  No focal deficit present.     Mental Status: She is alert and oriented to person, place, and time. Mental status is at baseline.     Sensory: No sensory deficit.     Motor: No weakness.  Psychiatric:        Mood and Affect: Mood normal.        Behavior: Behavior normal.     ED Results / Procedures / Treatments   Labs (all labs ordered are listed, but only abnormal results are displayed) Labs Reviewed  CBC - Abnormal; Notable for the following components:      Result Value   RBC 3.79 (*)    Hemoglobin 8.6 (*)    HCT 28.3 (*)    MCV 74.7 (*)    MCH 22.7 (*)    RDW 16.8 (*)    All other components within normal limits  DIFFERENTIAL - Abnormal; Notable for the following components:   Lymphs Abs 0.6 (*)    All other components within  normal limits  COMPREHENSIVE METABOLIC PANEL - Abnormal; Notable for the following components:   Glucose, Bld 238 (*)    BUN 33 (*)    Creatinine, Ser 1.20 (*)    Total Protein 6.0 (*)    AST 14 (*)    Alkaline Phosphatase 130 (*)    GFR, Estimated 44 (*)    All other components within normal limits  BRAIN NATRIURETIC PEPTIDE - Abnormal; Notable for the following components:   B Natriuretic Peptide 1,788.7 (*)    All other components within normal limits  CBG MONITORING, ED - Abnormal; Notable for the following components:   Glucose-Capillary 199 (*)    All other components within normal limits  ETHANOL  PROTIME-INR  APTT  RAPID URINE DRUG SCREEN, HOSP PERFORMED  URINALYSIS, ROUTINE W REFLEX MICROSCOPIC  TROPONIN I (HIGH SENSITIVITY)  TROPONIN I (HIGH SENSITIVITY)    EKG None  Radiology CT Angio Chest PE W and/or Wo Contrast  Result Date: 04/02/2023 CLINICAL DATA:  Concern for pulmonary embolism. Shortness of breath. EXAM: CT ANGIOGRAPHY CHEST WITH CONTRAST TECHNIQUE: Multidetector CT imaging of the chest was performed using the standard protocol during bolus administration of intravenous contrast. Multiplanar CT image reconstructions and MIPs were obtained to evaluate the vascular anatomy. RADIATION DOSE REDUCTION: This exam was performed according to the departmental dose-optimization program which includes automated exposure control, adjustment of the mA and/or kV according to patient size and/or use of iterative reconstruction technique. CONTRAST:  75mL OMNIPAQUE IOHEXOL 350 MG/ML SOLN COMPARISON:  Chest radiograph dated 03/19/2023. FINDINGS: Cardiovascular: Mild cardiomegaly. No pericardial effusion. There is coronary vascular calcification and calcification of the mitral annulus. Moderate atherosclerotic calcification of the thoracic aorta. No pulmonary artery embolus identified. Mediastinum/Nodes: No hilar adenopathy. The esophagus is grossly unremarkable. No mediastinal fluid  collection. Lungs/Pleura: Small bilateral pleural effusions and bibasilar atelectasis. Pneumonia is not excluded. Clinical correlation is recommended. There is no pneumothorax. The central airways are patent. Upper Abdomen: No acute abnormality. Musculoskeletal: Diffuse subcutaneous edema and anasarca. Osteopenia with degenerative changes. No acute osseous pathology. Multiple surgical clips in the right breast. Review of the MIP images confirms the above findings. IMPRESSION: 1. No CT evidence of pulmonary embolism. 2. Small bilateral pleural effusions and bibasilar atelectasis. Pneumonia is not excluded. 3.  Aortic Atherosclerosis (ICD10-I70.0). Electronically Signed   By: Elgie Collard M.D.   On: 04/02/2023 20:31   CT HEAD WO CONTRAST  Result Date: 04/02/2023 CLINICAL DATA:  Altered mental status. EXAM: CT HEAD WITHOUT  CONTRAST TECHNIQUE: Contiguous axial images were obtained from the base of the skull through the vertex without intravenous contrast. RADIATION DOSE REDUCTION: This exam was performed according to the departmental dose-optimization program which includes automated exposure control, adjustment of the mA and/or kV according to patient size and/or use of iterative reconstruction technique. COMPARISON:  January 06, 2023 FINDINGS: Brain: There is mild cerebral atrophy with widening of the extra-axial spaces and ventricular dilatation. There are areas of decreased attenuation within the white matter tracts of the supratentorial brain, consistent with microvascular disease changes. Vascular: There is marked severity bilateral cavernous carotid artery calcification. Skull: Normal. Negative for fracture or focal lesion. Sinuses/Orbits: No acute finding. Other: None. IMPRESSION: 1. Generalized cerebral atrophy with chronic white matter small vessel ischemic changes. 2. No acute intracranial abnormality. Electronically Signed   By: Aram Candela M.D.   On: 04/02/2023 19:53    Procedures Procedures     Medications Ordered in ED Medications  doxycycline (VIBRAMYCIN) 100 mg in sodium chloride 0.9 % 250 mL IVPB (100 mg Intravenous New Bag/Given 04/02/23 2150)  iohexol (OMNIPAQUE) 350 MG/ML injection 100 mL (75 mLs Intravenous Contrast Given 04/02/23 2005)  hydrALAZINE (APRESOLINE) injection 10 mg (10 mg Intravenous Given 04/02/23 2101)  cefTRIAXone (ROCEPHIN) 1 g in sodium chloride 0.9 % 100 mL IVPB (0 g Intravenous Stopped 04/02/23 2150)  furosemide (LASIX) injection 40 mg (40 mg Intravenous Given 04/02/23 2059)    ED Course/ Medical Decision Making/ A&P Clinical Course as of 04/02/23 2317  Tue Apr 02, 2023  1949 BP 180 systolic on recheck [MT]  1951 Patient another reported episode of unresponsiveness while she was at CT getting her CT head.  She reportedly became totally unresponsive in the scanner.  Upon my arrival, the patient was awake and verbalizing to me.  She had a sinus bradycardia, heart rate 50s.  Blood pressure was noted to be 230 systolic in the scanner.  Patient denies to me that she was having a headache but did report lightheadedness.  She denies chest pain or difficulty breathing.  She was brought back to resuscitation room where she was reevaluated.  She had a new oxygen requirement on 4L [MT]  2101 Dr Antionette Char accepting [MT]    Clinical Course User Index [MT] Candas Deemer, Kermit Balo, MD                                 Medical Decision Making Amount and/or Complexity of Data Reviewed Labs: ordered. Radiology: ordered.  Risk Prescription drug management. Decision regarding hospitalization.   This patient presents to the ED with concern for syncope versus near syncope episodes, transient altered mental status. This involves an extensive number of treatment options, and is a complaint that carries with it a high risk of complications and morbidity.  The differential diagnosis includes versus ACS versus PE versus infection versus other  Co-morbidities that complicate the  patient evaluation: History of breast cancer, on estrogen hormone, at high risk of thromboembolic event  Additional history obtained from the patient's daughter at bedside  I ordered and personally interpreted labs.  The pertinent results include: Chronic anemia, BNP elevated at [facility].  Troponin negative  I ordered imaging studies including CT head, x-ray chest, CT PE I independently visualized and interpreted imaging which showed potential underlying pneumonia, small atelectasis, no acute PE, no CVA I agree with the radiologist interpretation  The patient was maintained on a cardiac monitor.  I  personally viewed and interpreted the cardiac monitored which showed an underlying rhythm of: sinus rhythm and sinus bradycardia  Per my interpretation the patient's ECG shows sinus rhythm  I ordered medication including IV hydralazine for hypertension, IV Lasix for suspected CHF.  IV Rocephin and doxycycline for community pneumonia I have reviewed the patients home medicines and have made adjustments as needed  Test Considered: Doubt meningitis, no indication for LP  After the interventions noted above, I reevaluated the patient and found that they have: improved   Dispostion:  After consideration of the diagnostic results and the patients response to treatment, I feel that the patent would benefit from medical admission.         Final Clinical Impression(s) / ED Diagnoses Final diagnoses:  Pneumonia due to infectious organism, unspecified laterality, unspecified part of lung  Congestive heart failure, unspecified HF chronicity, unspecified heart failure type (HCC)  Hypertension, unspecified type  Near syncope    Rx / DC Orders ED Discharge Orders     None         Brysun Eschmann, Kermit Balo, MD 04/02/23 2317

## 2023-04-03 ENCOUNTER — Other Ambulatory Visit (HOSPITAL_COMMUNITY): Payer: Medicare Other

## 2023-04-03 ENCOUNTER — Inpatient Hospital Stay (HOSPITAL_COMMUNITY): Payer: Medicare Other

## 2023-04-03 DIAGNOSIS — J189 Pneumonia, unspecified organism: Secondary | ICD-10-CM

## 2023-04-03 DIAGNOSIS — R569 Unspecified convulsions: Secondary | ICD-10-CM | POA: Diagnosis not present

## 2023-04-03 DIAGNOSIS — R55 Syncope and collapse: Secondary | ICD-10-CM

## 2023-04-03 DIAGNOSIS — I10 Essential (primary) hypertension: Secondary | ICD-10-CM | POA: Diagnosis not present

## 2023-04-03 DIAGNOSIS — I509 Heart failure, unspecified: Secondary | ICD-10-CM | POA: Diagnosis not present

## 2023-04-03 LAB — GLUCOSE, CAPILLARY
Glucose-Capillary: 209 mg/dL — ABNORMAL HIGH (ref 70–99)
Glucose-Capillary: 282 mg/dL — ABNORMAL HIGH (ref 70–99)
Glucose-Capillary: 158 mg/dL — ABNORMAL HIGH (ref 70–99)
Glucose-Capillary: 139 mg/dL — ABNORMAL HIGH (ref 70–99)

## 2023-04-03 LAB — COMPREHENSIVE METABOLIC PANEL
ALT: 32 U/L (ref 0–44)
AST: 25 U/L (ref 15–41)
Albumin: 3.4 g/dL — ABNORMAL LOW (ref 3.5–5.0)
Alkaline Phosphatase: 142 U/L — ABNORMAL HIGH (ref 38–126)
Anion gap: 12 (ref 5–15)
BUN: 32 mg/dL — ABNORMAL HIGH (ref 8–23)
CO2: 25 mmol/L (ref 22–32)
Calcium: 9.6 mg/dL (ref 8.9–10.3)
Chloride: 102 mmol/L (ref 98–111)
Creatinine, Ser: 1.18 mg/dL — ABNORMAL HIGH (ref 0.44–1.00)
GFR, Estimated: 44 mL/min — ABNORMAL LOW (ref >60)
Glucose, Bld: 233 mg/dL — ABNORMAL HIGH (ref 70–99)
Potassium: 4 mmol/L (ref 3.5–5.1)
Sodium: 139 mmol/L (ref 135–145)
Total Bilirubin: 0.9 mg/dL (ref 0.3–1.2)
Total Protein: 5.7 g/dL — ABNORMAL LOW (ref 6.5–8.1)

## 2023-04-03 LAB — RAPID URINE DRUG SCREEN, HOSP PERFORMED
Amphetamines: NOT DETECTED
Barbiturates: NOT DETECTED
Benzodiazepines: NOT DETECTED
Cocaine: NOT DETECTED
Opiates: NOT DETECTED
Tetrahydrocannabinol: NOT DETECTED

## 2023-04-03 LAB — CBC
HCT: 29.6 % — ABNORMAL LOW (ref 36.0–46.0)
Hemoglobin: 8.8 g/dL — ABNORMAL LOW (ref 12.0–15.0)
MCH: 22.5 pg — ABNORMAL LOW (ref 26.0–34.0)
MCHC: 29.7 g/dL — ABNORMAL LOW (ref 30.0–36.0)
MCV: 75.7 fL — ABNORMAL LOW (ref 80.0–100.0)
Platelets: 252 10*3/uL (ref 150–400)
RBC: 3.91 MIL/uL (ref 3.87–5.11)
RDW: 16.7 % — ABNORMAL HIGH (ref 11.5–15.5)
WBC: 7.9 10*3/uL (ref 4.0–10.5)
nRBC: 0 % (ref 0.0–0.2)

## 2023-04-03 LAB — URINALYSIS, ROUTINE W REFLEX MICROSCOPIC
Bilirubin Urine: NEGATIVE
Glucose, UA: NEGATIVE mg/dL
Hgb urine dipstick: NEGATIVE
Ketones, ur: NEGATIVE mg/dL
Leukocytes,Ua: NEGATIVE
Nitrite: NEGATIVE
Protein, ur: NEGATIVE mg/dL
Specific Gravity, Urine: 1.011 (ref 1.005–1.030)
pH: 5 (ref 5.0–8.0)

## 2023-04-03 LAB — LIPID PANEL
Cholesterol: 115 mg/dL (ref 0–200)
HDL: 47 mg/dL (ref >40)
LDL Cholesterol: 56 mg/dL (ref 0–99)
Total CHOL/HDL Ratio: 2.4 RATIO
Triglycerides: 61 mg/dL (ref <150)
VLDL: 12 mg/dL (ref 0–40)

## 2023-04-03 LAB — TROPONIN I (HIGH SENSITIVITY)
Troponin I (High Sensitivity): 17 ng/L (ref <18)
Troponin I (High Sensitivity): 17 ng/L (ref <18)

## 2023-04-03 LAB — TSH: TSH: 4.296 u[IU]/mL (ref 0.350–4.500)

## 2023-04-03 MED ORDER — ENOXAPARIN SODIUM 40 MG/0.4ML IJ SOSY
40.0000 mg | PREFILLED_SYRINGE | Freq: Every day | INTRAMUSCULAR | Status: DC
  Administered 2023-04-03 – 2023-04-04 (×2): 40 mg via SUBCUTANEOUS
  Filled 2023-04-03 (×3): qty 0.4

## 2023-04-03 MED ORDER — BISACODYL 5 MG PO TBEC
5.0000 mg | DELAYED_RELEASE_TABLET | Freq: Every day | ORAL | Status: DC | PRN
  Administered 2023-04-08: 5 mg via ORAL
  Filled 2023-04-03: qty 1

## 2023-04-03 MED ORDER — ALBUTEROL SULFATE (2.5 MG/3ML) 0.083% IN NEBU: 2.5000 mg | INHALATION_SOLUTION | Freq: Four times a day (QID) | RESPIRATORY_TRACT | Status: DC | PRN

## 2023-04-03 MED ORDER — FUROSEMIDE 10 MG/ML IJ SOLN
40.0000 mg | Freq: Every day | INTRAMUSCULAR | Status: DC
  Administered 2023-04-03 – 2023-04-04 (×2): 40 mg via INTRAVENOUS
  Filled 2023-04-03 (×2): qty 4

## 2023-04-03 MED ORDER — ACETAMINOPHEN 325 MG PO TABS
650.0000 mg | ORAL_TABLET | Freq: Four times a day (QID) | ORAL | Status: DC | PRN
  Administered 2023-04-05: 650 mg via ORAL
  Filled 2023-04-03: qty 2

## 2023-04-03 MED ORDER — HYDRALAZINE HCL 20 MG/ML IJ SOLN
10.0000 mg | INTRAMUSCULAR | Status: DC | PRN
  Administered 2023-04-03 – 2023-04-04 (×2): 10 mg via INTRAVENOUS
  Administered 2023-04-04 – 2023-04-08 (×4): 20 mg via INTRAVENOUS
  Filled 2023-04-03 (×6): qty 1

## 2023-04-03 MED ORDER — SODIUM CHLORIDE 0.9% FLUSH
3.0000 mL | Freq: Two times a day (BID) | INTRAVENOUS | Status: DC
  Administered 2023-04-03 – 2023-04-09 (×13): 3 mL via INTRAVENOUS

## 2023-04-03 MED ORDER — INSULIN ASPART 100 UNIT/ML IJ SOLN
0.0000 [IU] | Freq: Three times a day (TID) | INTRAMUSCULAR | Status: DC
  Administered 2023-04-03: 3 [IU] via SUBCUTANEOUS
  Administered 2023-04-03: 8 [IU] via SUBCUTANEOUS
  Administered 2023-04-03: 5 [IU] via SUBCUTANEOUS
  Administered 2023-04-04 (×3): 3 [IU] via SUBCUTANEOUS
  Administered 2023-04-05: 5 [IU] via SUBCUTANEOUS
  Administered 2023-04-05: 8 [IU] via SUBCUTANEOUS
  Administered 2023-04-06 (×3): 5 [IU] via SUBCUTANEOUS

## 2023-04-03 MED ORDER — SENNOSIDES-DOCUSATE SODIUM 8.6-50 MG PO TABS: 1.0000 | ORAL_TABLET | Freq: Every evening | ORAL | Status: DC | PRN

## 2023-04-03 MED ORDER — IRBESARTAN 300 MG PO TABS
150.0000 mg | ORAL_TABLET | Freq: Every day | ORAL | Status: DC
  Administered 2023-04-03 – 2023-04-09 (×7): 150 mg via ORAL
  Filled 2023-04-03 (×7): qty 1

## 2023-04-03 MED ORDER — AMOXICILLIN-POT CLAVULANATE 875-125 MG PO TABS
1.0000 | ORAL_TABLET | Freq: Two times a day (BID) | ORAL | Status: DC
  Administered 2023-04-03 – 2023-04-04 (×2): 1 via ORAL
  Filled 2023-04-03 (×2): qty 1

## 2023-04-03 MED ORDER — PANTOPRAZOLE SODIUM 40 MG PO TBEC
40.0000 mg | DELAYED_RELEASE_TABLET | Freq: Every day | ORAL | Status: DC
  Administered 2023-04-03 – 2023-04-09 (×7): 40 mg via ORAL
  Filled 2023-04-03 (×7): qty 1

## 2023-04-03 MED ORDER — HYDROCHLOROTHIAZIDE 12.5 MG PO TABS
12.5000 mg | ORAL_TABLET | Freq: Every day | ORAL | Status: DC
  Administered 2023-04-03 – 2023-04-04 (×2): 12.5 mg via ORAL
  Filled 2023-04-03 (×2): qty 1

## 2023-04-03 MED ORDER — INSULIN ASPART 100 UNIT/ML IJ SOLN
0.0000 [IU] | Freq: Every day | INTRAMUSCULAR | Status: DC
  Administered 2023-04-06: 2 [IU] via SUBCUTANEOUS

## 2023-04-03 MED ORDER — TELMISARTAN-HCTZ 40-12.5 MG PO TABS: 1.0000 | ORAL_TABLET | Freq: Every day | ORAL | Status: DC

## 2023-04-03 MED ORDER — ACETAMINOPHEN 650 MG RE SUPP: 650.0000 mg | Freq: Four times a day (QID) | RECTAL | Status: DC | PRN

## 2023-04-03 MED ORDER — SODIUM CHLORIDE 0.9 % IV SOLN: 1.0000 g | INTRAVENOUS | Status: DC

## 2023-04-03 MED ORDER — TRAZODONE HCL 50 MG PO TABS
25.0000 mg | ORAL_TABLET | Freq: Every evening | ORAL | Status: DC | PRN
  Administered 2023-04-03 – 2023-04-06 (×3): 25 mg via ORAL
  Filled 2023-04-03 (×3): qty 1

## 2023-04-03 MED ORDER — HYDROCODONE-ACETAMINOPHEN 5-325 MG PO TABS
1.0000 | ORAL_TABLET | ORAL | Status: DC | PRN
  Administered 2023-04-05 – 2023-04-06 (×2): 2 via ORAL
  Filled 2023-04-03 (×2): qty 2

## 2023-04-03 MED ORDER — SIMVASTATIN 20 MG PO TABS
20.0000 mg | ORAL_TABLET | Freq: Every day | ORAL | Status: DC
  Administered 2023-04-03 – 2023-04-09 (×7): 20 mg via ORAL
  Filled 2023-04-03 (×7): qty 1

## 2023-04-03 MED ORDER — ONDANSETRON HCL 4 MG PO TABS: 4.0000 mg | ORAL_TABLET | Freq: Four times a day (QID) | ORAL | Status: DC | PRN

## 2023-04-03 MED ORDER — ONDANSETRON HCL 4 MG/2ML IJ SOLN: 4.0000 mg | Freq: Four times a day (QID) | INTRAMUSCULAR | Status: DC | PRN

## 2023-04-03 MED ORDER — HYDRALAZINE HCL 20 MG/ML IJ SOLN
5.0000 mg | INTRAMUSCULAR | Status: DC | PRN
  Administered 2023-04-03 (×3): 5 mg via INTRAVENOUS
  Filled 2023-04-03 (×4): qty 1

## 2023-04-03 MED ORDER — SODIUM CHLORIDE 0.9 % IV SOLN
100.0000 mg | Freq: Two times a day (BID) | INTRAVENOUS | Status: DC
  Administered 2023-04-03: 100 mg via INTRAVENOUS
  Filled 2023-04-03 (×2): qty 100

## 2023-04-03 MED ORDER — IPRATROPIUM BROMIDE 0.02 % IN SOLN: 0.5000 mg | Freq: Four times a day (QID) | RESPIRATORY_TRACT | Status: DC | PRN

## 2023-04-03 NOTE — Progress Notes (Signed)
Please see H&P done earlier this morning by Dr. Emmit Pomfret  In brief-89 year old with HTN, HLD, DM-2, chronic HFpEF-who apparently lives at ALF-was at PCPs office when she had an episode where she started drooling, and was unable to speak-she was found to have elevated hypertension-and brought to the ED where she had a episode of syncope when getting a CT scan of the head.  She was also found to have hypoxia requiring around 4 L of oxygen.  She was then admitted to the hospitalist service.  Significant studies 8/27>>CT head: No acute abnormalities 8/27>>CT angiogram chest: No PE-bibasilar atelectasis versus pneumonia. 8/28 MRI brain: No acute CVA  Subjective: Lying comfortably in bed-does not recollect exactly what happened to her yesterday.  Objective Blood pressure (!) 148/93, pulse (!) 56, temperature (!) 97.3 F (36.3 C), temperature source Oral, resp. rate (!) 22, height 5\' 2"  (1.575 m), weight 77.1 kg, SpO2 (!) 88%.   Chest: Clear to auscultation CVS: S1-S2 regular Abdomen: Soft nontender Extremities: No edema Neurology: Speech clear, nonfocal  Assessment/plan Syncope while in the emergency room Unclear etiology Continue telemetry monitoring Echo/EEG Orthostatic vital signs  Drooling/possible right facial droop while at PCPs office Unclear exactly what her deficits were-apparently this was very brief MRI brain negative for CVA-doubt further workup required.  Hypertensive crisis SBP in the 220s-230s systolic on initial presentation Currently stable-continue HCTZ/irbesartan Follow-if needed-add amlodipine Coreg on hold due to concern for bradycardia  Sinus bradycardia Heart rate in the 50s overnight Beta-blocker on hold Telemetry monitoring TSH-stable  Acute hypoxic respiratory failure secondary to presumed aspiration PNA and HFpEF exacerbation in the setting of uncontrolled hypertension Stable on 2 L Change antibiotics to Unasyn Gently diurese Attempt to titrate  off oxygen  CKD stage IIIa Close to baseline  HLD Statin  GERD PPI  DM-2 (A1c 6.7 on 6/6) SSI Resume oral hypoglycemics on discharge  Recent Labs    04/02/23 1923 04/03/23 0731 04/03/23 1158  GLUCAP 199* 209* 282*    VTE prophylaxis SQ Lovenox  Disposition ALF with PT versus SNF  Family communication Son-Michael-[phone]-left voicemail on 8/28

## 2023-04-03 NOTE — Progress Notes (Signed)
MB asked Nurse to place Pt back in the Bed, Pt still not in Bed at 4:50 pm will attempt as schedule permits

## 2023-04-03 NOTE — Progress Notes (Signed)
Patient sitting up on the side of the bed with PT and OT in the room.  Patient is refusing to sit in the recliner chair or to lay down in the bed and is insisting that she remain on the side of the bed.  Therapist was able to convince patient to move to the recliner chair as it would be a safer spot for her to sit than the edge of the bed.  Patient ate her lunch in the recliner  but shortly after did return to the side of the bed.  Bed alarm on and  call bell within reach.

## 2023-04-03 NOTE — Plan of Care (Signed)
  Problem: Education: Goal: Knowledge of General Education information will improve Description: Including pain rating scale, medication(s)/side effects and non-pharmacologic comfort measures Outcome: Progressing   Problem: Health Behavior/Discharge Planning: Goal: Ability to manage health-related needs will improve Outcome: Progressing   Problem: Clinical Measurements: Goal: Ability to maintain clinical measurements within normal limits will improve Outcome: Progressing Goal: Will remain free from infection Outcome: Progressing Goal: Diagnostic test results will improve Outcome: Progressing Goal: Respiratory complications will improve Outcome: Progressing Goal: Cardiovascular complication will be avoided Outcome: Progressing   Problem: Activity: Goal: Risk for activity intolerance will decrease Outcome: Progressing   Problem: Nutrition: Goal: Adequate nutrition will be maintained Outcome: Progressing   Problem: Coping: Goal: Level of anxiety will decrease Outcome: Progressing   Problem: Elimination: Goal: Will not experience complications related to bowel motility Outcome: Progressing Goal: Will not experience complications related to urinary retention Outcome: Progressing   Problem: Pain Managment: Goal: General experience of comfort will improve Outcome: Progressing   Problem: Safety: Goal: Ability to remain free from injury will improve Outcome: Progressing   Problem: Skin Integrity: Goal: Risk for impaired skin integrity will decrease Outcome: Progressing   Problem: Education: Goal: Ability to demonstrate management of disease process will improve Outcome: Progressing Goal: Ability to verbalize understanding of medication therapies will improve Outcome: Progressing Goal: Individualized Educational Video(s) Outcome: Progressing   Problem: Activity: Goal: Capacity to carry out activities will improve Outcome: Progressing   Problem: Cardiac: Goal:  Ability to achieve and maintain adequate cardiopulmonary perfusion will improve Outcome: Progressing   Problem: Education: Goal: Knowledge of General Education information will improve Description: Including pain rating scale, medication(s)/side effects and non-pharmacologic comfort measures Outcome: Progressing   Problem: Health Behavior/Discharge Planning: Goal: Ability to manage health-related needs will improve Outcome: Progressing   Problem: Clinical Measurements: Goal: Ability to maintain clinical measurements within normal limits will improve Outcome: Progressing   Problem: Clinical Measurements: Goal: Will remain free from infection Outcome: Progressing   Problem: Clinical Measurements: Goal: Diagnostic test results will improve Outcome: Progressing   Problem: Clinical Measurements: Goal: Respiratory complications will improve Outcome: Progressing   Problem: Clinical Measurements: Goal: Cardiovascular complication will be avoided Outcome: Progressing   Problem: Activity: Goal: Risk for activity intolerance will decrease Outcome: Progressing   Problem: Nutrition: Goal: Adequate nutrition will be maintained Outcome: Progressing   Problem: Coping: Goal: Level of anxiety will decrease Outcome: Progressing   Problem: Coping: Goal: Level of anxiety will decrease Outcome: Progressing   Problem: Elimination: Goal: Will not experience complications related to bowel motility Outcome: Progressing   Problem: Pain Managment: Goal: General experience of comfort will improve Outcome: Progressing   Problem: Skin Integrity: Goal: Risk for impaired skin integrity will decrease Outcome: Progressing   Problem: Safety: Goal: Ability to remain free from injury will improve Outcome: Progressing   Problem: Safety: Goal: Ability to remain free from injury will improve Outcome: Progressing   Problem: Education: Goal: Individualized Educational Video(s) Outcome:  Progressing   Problem: Activity: Goal: Capacity to carry out activities will improve Outcome: Progressing   Problem: Cardiac: Goal: Ability to achieve and maintain adequate cardiopulmonary perfusion will improve Outcome: Progressing

## 2023-04-03 NOTE — H&P (Signed)
History and Physical   [facility] Admission History and Physical [employer], D.O.    Patient Name: Laurie Fox MR#: [id number] Date of Birth: 1931-09-13 Date of Admission: 04/02/2023  Referring MD/NP/PA: Dr. Renaye Rakers Primary Care Physician: Shireen Quan, DO  Chief Complaint:  Chief Complaint  Patient presents with   Hypertension    HPI: Laurie Fox is a 89 y.o. female with a known history of BRCA on hormone therapy, HTN, HLD, DM, CKD3a, chronic diastolic CHF presents to the emergency department for evaluation of AMS.  Patient was in a usual state of health until yesterday afternoon she was at primary care doctor's office when she had a transient episode of unresponsiveness, facial drooping, drooling and difficulty speaking.  Patient spontaneously recovered after about 3 minutes or so..  Found to be hypertensive and hypoxic with sats in the mid 80s.  Of note she had another episode of unresponsiveness while in the CT scanner.  At that time she was found to be hypertensive with a systolic in the 230s and bradycardic with a heart rate in the 50s.  EMS/ED Course: Patient received Lasix, hydralazine, Rocephin and doxycycline. Medical admission has been requested for further management of hypoxic respiratory failure secondary to pneumonia, congestive heart failure, severe uncontrolled hypertension and syncopal episode.  Review of Systems:  CONSTITUTIONAL: No fever/chills, fatigue, weakness, weight gain/loss, headache. EYES: No blurry or double vision. ENT: No tinnitus, postnasal drip, redness or soreness of the oropharynx. RESPIRATORY: No cough, dyspnea, wheeze.  No hemoptysis.  CARDIOVASCULAR:Positive syncope. No chest pain, palpitations, orthopnea. No lower extremity edema.  GASTROINTESTINAL: No nausea, vomiting, abdominal pain, diarrhea, constipation.  No hematemesis, melena or hematochezia. GENITOURINARY: No dysuria, frequency, hematuria. ENDOCRINE:  No polyuria or nocturia. No heat or cold intolerance. HEMATOLOGY: No anemia, bruising, bleeding. INTEGUMENTARY: No rashes, ulcers, lesions. MUSCULOSKELETAL: No arthritis, gout. NEUROLOGIC: Syncope/AMS No numbness, tingling, ataxia, seizure-type activity, weakness. PSYCHIATRIC: No anxiety, depression, insomnia.   Past Medical History:  Diagnosis Date   Allergy    Arthritis    knees, osteo, shoulders,fingers   Breast cancer (HCC) 07/15/13   right breast    Diabetes mellitus without complication (HCC)    type II   GERD (gastroesophageal reflux disease)    History of radiation therapy 09/15/13-10/12/13   right breast   Hypertension    Personal history of radiation therapy    Wears glasses     Past Surgical History:  Procedure Laterality Date   BREAST BIOPSY Right 07/15/13   bx=mass 1 0'clock, invasive mammary ca, mammary ca in situ   BREAST LUMPECTOMY     BREAST LUMPECTOMY WITH NEEDLE LOCALIZATION AND AXILLARY SENTINEL LYMPH NODE BX Right 08/11/2013   Procedure: RIGHT BREAST LUMPECTOMY WITH NEEDLE LOCALIZATION AND AXILLARY SENTINEL LYMPH NODE BIOPSY;  Surgeon: Almond Lint, MD;  Location: [facility];  Service: General;  Laterality: Right;   COLONOSCOPY     DILATION AND CURETTAGE OF UTERUS     FRACTURE SURGERY  2006   lt foot   KNEE SURGERY  [phone]   lt and rt knee scopes   TONSILLECTOMY     TOTAL HIP ARTHROPLASTY Right 01/12/2023   Procedure: TOTAL HIP ARTHROPLASTY ANTERIOR APPROACH;  Surgeon: Samson Frederic, MD;  Location: [facility] OR;  Service: Orthopedics;  Laterality: Right;     reports that she has never smoked. She does not have any smokeless tobacco history on file. She reports that she does not drink alcohol and does not use drugs.  Allergies  Allergen Reactions   Codeine Nausea And Vomiting   Sulfa Antibiotics Rash    Family History  Problem Relation Age of Onset   Alzheimer's disease Mother    Colon cancer Father     Prior to Admission medications    Medication Sig Start Date End Date Taking? Authorizing Provider  acetaminophen (TYLENOL) 500 MG tablet Take 1,000 mg by mouth every 8 (eight) hours as needed for moderate pain, fever or headache.    [provider]  anastrozole (ARIMIDEX) 1 MG tablet TAKE 1 TABLET BY MOUTH  DAILY Patient not taking: Reported on 01/10/2023 10/13/20   Serena Croissant, MD  ASCORBIC ACID PO Take 1 tablet by mouth daily. Vitamin C, unknown strength.    [provider]  carvedilol (COREG) 25 MG tablet Take 25 mg by mouth 2 (two) times daily with a meal.    [provider]  furosemide (LASIX) 40 MG tablet Take 40 mg by mouth daily.    [provider]  Lansoprazole (PREVACID 24HR PO) Take 1 capsule by mouth daily.    [provider]  Multiple Vitamins-Minerals (ONE-A-DAY WOMENS 50+) TABS Take 1 tablet by mouth daily in the afternoon.    [provider]  omeprazole (PRILOSEC) 20 MG capsule Take 1 capsule (20 mg total) by mouth daily. 01/14/23 02/13/23  Hughie Closs, MD  potassium chloride (KLOR-CON) 10 MEQ tablet Take 10 mEq by mouth daily.    [provider]  simvastatin (ZOCOR) 20 MG tablet Take 20 mg by mouth daily.    [provider]  telmisartan-hydrochlorothiazide (MICARDIS HCT) 40-12.5 MG per tablet Take 1 tablet by mouth daily.    [provider]    Physical Exam: Vitals:   04/02/23 2104 04/02/23 2130 04/02/23 2200 04/02/23 2210  BP: (!) 227/70 (!) 194/56 (!) 186/61   Pulse: (!) 58 (!) 47 (!) 57   Resp: 20 20 (!) 22   Temp:      TempSrc:      SpO2: 98% 100% 99% 99%  Weight:      Height:        GENERAL: 89 y.o.-year-old white female patient, well-developed, well-nourished lying in the bed in no acute distress.  Pleasant and cooperative.   HEENT: Head atraumatic, normocephalic. Pupils equal. Mucus membranes moist. NECK: Supple. No JVD. CHEST: Normal breath sounds bilaterally. No wheezing, rales, rhonchi or crackles. No use of  accessory muscles of respiration.  CARDIOVASCULAR: S1, S2 normal. No murmurs, rubs, or gallops. Cap refill <2 seconds. Pulses intact distally.  ABDOMEN: Soft, nondistended, nontender. No rebound, guarding, rigidity. Normoactive bowel sounds present in all four quadrants.  EXTREMITIES: No pedal edema, cyanosis, or clubbing. No calf tenderness or Homan's sign.  NEUROLOGIC: The patient is alert and oriented x 2. Cranial nerves II through XII are grossly intact with no focal sensorimotor deficit. SKIN: Warm, dry, and intact without obvious rash, lesion, or ulcer.    Labs on Admission:  CBC: Recent Labs  Lab 04/02/23 1901  WBC 7.3  NEUTROABS 6.0  HGB 8.6*  HCT 28.3*  MCV 74.7*  PLT 270   Basic Metabolic Panel: Recent Labs  Lab 04/02/23 1901  NA 139  K 4.4  CL 102  CO2 27  GLUCOSE 238*  BUN 33*  CREATININE 1.20*  CALCIUM 9.3   GFR: Estimated Creatinine Clearance: 31.2 mL/min (A) (by C-G formula based on SCr of 1.2 mg/dL (H)). Liver Function Tests: Recent Labs  Lab 04/02/23 1901  AST 14*  ALT  20  ALKPHOS 130*  BILITOT 0.6  PROT 6.0*  ALBUMIN 3.8   No results for input(s): "LIPASE", "AMYLASE" in the last 168 hours. No results for input(s): "AMMONIA" in the last 168 hours. Coagulation Profile: Recent Labs  Lab 04/02/23 1901  INR 1.1   Cardiac Enzymes: No results for input(s): "CKTOTAL", "CKMB", "CKMBINDEX", "TROPONINI" in the last 168 hours. BNP (last 3 results) No results for input(s): "PROBNP" in the last 8760 hours. HbA1C: No results for input(s): "HGBA1C" in the last 72 hours. CBG: Recent Labs  Lab 04/02/23 1923  GLUCAP 199*   Lipid Profile: No results for input(s): "CHOL", "HDL", "LDLCALC", "TRIG", "CHOLHDL", "LDLDIRECT" in the last 72 hours. Thyroid Function Tests: No results for input(s): "TSH", "T4TOTAL", "FREET4", "T3FREE", "THYROIDAB" in the last 72 hours. Anemia Panel: No results for input(s): "VITAMINB12", "FOLATE", "FERRITIN", "TIBC",  "IRON", "RETICCTPCT" in the last 72 hours. Urine analysis: No results found for: "COLORURINE", "APPEARANCEUR", "LABSPEC", "PHURINE", "GLUCOSEU", "HGBUR", "BILIRUBINUR", "KETONESUR", "PROTEINUR", "UROBILINOGEN", "NITRITE", "LEUKOCYTESUR" Sepsis Labs: @LABRCNTIP (procalcitonin:4,lacticidven:4) )No results found for this or any previous visit (from the past 240 hour(s)).   Radiological Exams on Admission: CT Angio Chest PE W and/or Wo Contrast  Result Date: 04/02/2023 CLINICAL DATA:  Concern for pulmonary embolism. Shortness of breath. EXAM: CT ANGIOGRAPHY CHEST WITH CONTRAST TECHNIQUE: Multidetector CT imaging of the chest was performed using the standard protocol during bolus administration of intravenous contrast. Multiplanar CT image reconstructions and MIPs were obtained to evaluate the vascular anatomy. RADIATION DOSE REDUCTION: This exam was performed according to the departmental dose-optimization program which includes automated exposure control, adjustment of the mA and/or kV according to patient size and/or use of iterative reconstruction technique. CONTRAST:  75mL OMNIPAQUE IOHEXOL 350 MG/ML SOLN COMPARISON:  Chest radiograph dated 03/19/2023. FINDINGS: Cardiovascular: Mild cardiomegaly. No pericardial effusion. There is coronary vascular calcification and calcification of the mitral annulus. Moderate atherosclerotic calcification of the thoracic aorta. No pulmonary artery embolus identified. Mediastinum/Nodes: No hilar adenopathy. The esophagus is grossly unremarkable. No mediastinal fluid collection. Lungs/Pleura: Small bilateral pleural effusions and bibasilar atelectasis. Pneumonia is not excluded. Clinical correlation is recommended. There is no pneumothorax. The central airways are patent. Upper Abdomen: No acute abnormality. Musculoskeletal: Diffuse subcutaneous edema and anasarca. Osteopenia with degenerative changes. No acute osseous pathology. Multiple surgical clips in the right breast.  Review of the MIP images confirms the above findings. IMPRESSION: 1. No CT evidence of pulmonary embolism. 2. Small bilateral pleural effusions and bibasilar atelectasis. Pneumonia is not excluded. 3.  Aortic Atherosclerosis (ICD10-I70.0). Electronically Signed   By: Elgie Collard M.D.   On: 04/02/2023 20:31   CT HEAD WO CONTRAST  Result Date: 04/02/2023 CLINICAL DATA:  Altered mental status. EXAM: CT HEAD WITHOUT CONTRAST TECHNIQUE: Contiguous axial images were obtained from the base of the skull through the vertex without intravenous contrast. RADIATION DOSE REDUCTION: This exam was performed according to the departmental dose-optimization program which includes automated exposure control, adjustment of the mA and/or kV according to patient size and/or use of iterative reconstruction technique. COMPARISON:  January 06, 2023 FINDINGS: Brain: There is mild cerebral atrophy with widening of the extra-axial spaces and ventricular dilatation. There are areas of decreased attenuation within the white matter tracts of the supratentorial brain, consistent with microvascular disease changes. Vascular: There is marked severity bilateral cavernous carotid artery calcification. Skull: Normal. Negative for fracture or focal lesion. Sinuses/Orbits: No acute finding. Other: None. IMPRESSION: 1. Generalized cerebral atrophy with chronic white matter small vessel ischemic changes. 2. No acute intracranial  abnormality. Electronically Signed   By: Aram Candela M.D.   On: 04/02/2023 19:53    EKG: Sinus brady at 59. IVCD, RBBB, LVH bpm with normal axis and nonspecific ST-T wave changes.   Assessment/Plan  This is a 89 y.o. female with a history of BRCA on hormone therapy, HTN, HLD, DM, CKD3a, chronic diastolic CHF now being admitted with:  #. Syncope ? 2/2 symptomatic bradycardia ? 2/2 beta blocker, also in the differential but less likely is TIA/ CVA - Inpatient Telemetry monitoring. - Hold Coreg - Check  orthostatics - Trend trops, check TSH, lipids - Consider MRI - Consider cardio consult  #. Acute hypoxic respiratory failure 2/2 CHF exac and possible CAP - Will need to hold coreg due to bradycardia - Continue Lasix - Intake/output, daily weight. - Trend troponins, check lipids and TSH. - Echo done 03/22/23 - IV antibiotics: continue Rocephin, Doxy  #. Severe uncontrolled HTN - Continue Lasix,Micardis, - Hydralazine PRN  #. Microcytic anemia - Check iron studies, B12, folate, FOBT  #. History of DM - Continue heart healthy carb controlled - Accuchecks achs with ISS  #. History of CKD near baseline - Continue monitor BPMP  #. History of HLD - Continue simvastatin  #. History of BRCA - Reconcile if on anastrazole or not  #. History of GERD - Continue Prilosec  Admission status: Progressive, tele IV Fluids: HL Diet/Nutrition: Heart healthy carb controlled Consults called: Will need cardio  DVT Px: Lovenox, SCDs and early ambulation. Code Status: Full Code  Disposition Plan: To home in TBD  All the records are reviewed and case discussed with ED provider. Management plans discussed with the patient and/or family who express understanding and agree with plan of care.  Flornce Record D.O. on 04/03/2023 at 12:04 AM CC: Primary care physician; Shireen Quan, DO   04/03/2023, 12:04 AM

## 2023-04-03 NOTE — Progress Notes (Signed)
Patient returns from MRI at this time.

## 2023-04-03 NOTE — Evaluation (Signed)
Physical Therapy Evaluation  Patient Details Name: Laurie Fox MRN: [id number] DOB: 02-26-1932 Today's Date: 04/03/2023  History of Present Illness  Pt is an 89 y/o female who was at her PCP's office 04/02/2023 when she had an episode of drooling and inability to speak. She was found to have elevated HTN and was brought to the ED. She then had an episode of syncope when getting a CT scan of the head and was found to have hypoxia requiring ~4L/min supplemental O2. MRI negative, CT negative. PMH significant for breast CA, DM, HTN, L foot fracture surgery 2006, R THR 01/12/2023.   Clinical Impression  Pt admitted with above diagnosis. Pt currently with functional limitations due to the deficits listed below (see PT Problem List). At the time of PT eval pt was able to perform transfers with up to +2 min assist and RW for support. Pt initially asking to sit up more for lunch however became unwilling to cooperate once sitting up. Pt eventually agreed to sit in the recliner for lunch after talking with her daughter on the phone. Daughter polite and understanding, and was able to encourage pt to participate in repositioning for safety. Pt will benefit from acute skilled PT to increase their independence and safety with mobility to allow discharge.           If plan is discharge home, recommend the following: A little help with walking and/or transfers;A little help with bathing/dressing/bathroom;Assistance with cooking/housework;Direct supervision/assist for medications management;Assist for transportation;Help with stairs or ramp for entrance;Supervision due to cognitive status   Can travel by private vehicle        Equipment Recommendations None recommended by PT  Recommendations for Other Services       Functional Status Assessment Patient has had a recent decline in their functional status and demonstrates the ability to make significant improvements in function in a reasonable and predictable  amount of time.     Precautions / Restrictions Precautions Precautions: Fall Restrictions Weight Bearing Restrictions: No      Mobility  Bed Mobility Overal bed mobility: Needs Assistance Bed Mobility: Supine to Sit     Supine to sit: Contact guard     General bed mobility comments: Close guard for safety as pt transitioned to EOB. Increased time but no assist required.    Transfers Overall transfer level: Needs assistance Equipment used: Rolling walker (2 wheels) Transfers: Sit to/from Stand, Bed to chair/wheelchair/BSC Sit to Stand: Min assist, +2 physical assistance   Step pivot transfers: Contact guard assist, +2 safety/equipment       General transfer comment: Assist for power up to full stand. Pt able to take pivotal steps around to the recliner without significant assistance, however close hands on guarding provided for safety.    Ambulation/Gait               General Gait Details: Pt not willing to progress mobility at this time.  Stairs            Wheelchair Mobility     Tilt Bed    Modified Rankin (Stroke Patients Only)       Balance Overall balance assessment: Needs assistance Sitting-balance support: Feet supported, No upper extremity supported Sitting balance-Leahy Scale: Fair     Standing balance support: No upper extremity supported, During functional activity Standing balance-Leahy Scale: Fair  Pertinent Vitals/Pain Pain Assessment Pain Assessment: Faces Faces Pain Scale: Hurts a little bit Pain Descriptors / Indicators: Headache Pain Intervention(s): Limited activity within patient's tolerance, Monitored during session, Repositioned    Home Living Family/patient expects to be discharged to:: Private residence Living Arrangements: Alone Available Help at Discharge: Family;Available PRN/intermittently (ALF staff??) Type of Home: Other(Comment) (Assisted living facility)            Home Equipment: Rolling Walker (2 wheels);Grab bars - tub/shower;Shower seat      Prior Function Prior Level of Function : Independent/Modified Independent             Mobility Comments: RW for mobility. Pt reports she walks to a dining room for meals. ADLs Comments: Pt reports independence however states she sits for showering and leans to the side to wash bottom.     Extremity/Trunk Assessment   Upper Extremity Assessment Upper Extremity Assessment: Defer to OT evaluation    Lower Extremity Assessment Lower Extremity Assessment: Generalized weakness    Cervical / Trunk Assessment Cervical / Trunk Assessment: Kyphotic  Communication   Communication Communication: No apparent difficulties Cueing Techniques: Verbal cues;Gestural cues  Cognition Arousal: Alert Behavior During Therapy: WFL for tasks assessed/performed Overall Cognitive Status: Impaired/Different from baseline Area of Impairment: Orientation, Attention, Memory, Following commands, Safety/judgement, Awareness, Problem solving                 Orientation Level: Disoriented to, Situation (Did not ask about time) Current Attention Level: Sustained Memory: Decreased short-term memory Following Commands: Follows one step commands with increased time Safety/Judgement: Decreased awareness of safety Awareness: Intellectual Problem Solving: Slow processing, Requires verbal cues          General Comments      Exercises     Assessment/Plan    PT Assessment Patient needs continued PT services  PT Problem List Decreased strength;Decreased range of motion;Decreased balance;Decreased mobility;Decreased coordination;Decreased knowledge of use of DME;Decreased safety awareness;Decreased knowledge of precautions       PT Treatment Interventions DME instruction;Gait training;Functional mobility training;Therapeutic activities;Therapeutic exercise;Balance training;Neuromuscular  re-education;Patient/family education    PT Goals (Current goals can be found in the Care Plan section)  Acute Rehab PT Goals Patient Stated Goal: Eat lunch PT Goal Formulation: With patient Time For Goal Achievement: 04/17/23 Potential to Achieve Goals: Good    Frequency Min 1X/week     Co-evaluation PT/OT/SLP Co-Evaluation/Treatment: Yes Reason for Co-Treatment: Necessary to address cognition/behavior during functional activity;For patient/therapist safety;To address functional/ADL transfers PT goals addressed during session: Mobility/safety with mobility;Balance;Proper use of DME         AM-PAC PT "6 Clicks" Mobility  Outcome Measure Help needed turning from your back to your side while in a flat bed without using bedrails?: A Little Help needed moving from lying on your back to sitting on the side of a flat bed without using bedrails?: A Little Help needed moving to and from a bed to a chair (including a wheelchair)?: A Little Help needed standing up from a chair using your arms (e.g., wheelchair or bedside chair)?: A Lot Help needed to walk in hospital room?: A Little Help needed climbing 3-5 steps with a railing? : A Little 6 Click Score: 17    End of Session Equipment Utilized During Treatment: Oxygen Activity Tolerance: Patient tolerated treatment well Patient left: in chair;with call bell/phone within reach;with chair alarm set Nurse Communication: Mobility status PT Visit Diagnosis: Unsteadiness on feet (R26.81);Difficulty in walking, not elsewhere classified (R26.2)    Time:  1540-0867 PT Time Calculation (min) (ACUTE ONLY): 30 min   Charges:   PT Evaluation $PT Eval Moderate Complexity: 1 Mod   PT General Charges $$ ACUTE PT VISIT: 1 Visit         Conni Slipper, PT, DPT Acute Rehabilitation Services Secure Chat Preferred Office: [phone]   Marylynn Pearson 04/03/2023, 3:08 PM

## 2023-04-03 NOTE — Procedures (Signed)
Patient Name: Laurie Fox  MRN: [id number]  Epilepsy Attending: Charlsie Quest  Referring Physician/Provider: Maretta Bees, MD  Date: 04/03/2023 Duration: 26.08 mins  Patient history: 89yo F with an episode of unresponsiveness getting eeg to evaluate for seizure.  Level of alertness: Awake, asleep  AEDs during EEG study: None  Technical aspects: This EEG study was done with scalp electrodes positioned according to the 10-20 International system of electrode placement. Electrical activity was reviewed with band pass filter of 1-70Hz , sensitivity of 7 uV/mm, display speed of 60mm/sec with a 60Hz  notched filter applied as appropriate. EEG data were recorded continuously and digitally stored.  Video monitoring was available and reviewed as appropriate.  Description: The posterior dominant rhythm consists of 8 Hz activity of moderate voltage (25-35 uV) seen predominantly in posterior head regions, symmetric and reactive to eye opening and eye closing. Sleep was characterized by sleep spindles (12 to 14 Hz), maximal frontocentral region. EEG showed intermittent generalized 3 to 6 Hz theta-delta slowing. Hyperventilation and photic stimulation were not performed.     ABNORMALITY - Intermittent slow, generalized  IMPRESSION: This study is suggestive of mild diffuse encephalopathy, nonspecific etiology. No seizures or epileptiform discharges were seen throughout the recording.  Jakhiya Brower Annabelle Harman

## 2023-04-03 NOTE — Progress Notes (Signed)
Patient to MRI at this time.

## 2023-04-03 NOTE — Evaluation (Signed)
Occupational Therapy Evaluation Patient Details Name: Laurie Fox MRN: [id number] DOB: 19-Oct-1931 Today's Date: 04/03/2023   History of Present Illness Pt is an 89 y/o female who was at her PCP's office 04/02/2023 when she had an episode of drooling and inability to speak. She was found to have elevated HTN and was brought to the ED. She then had an episode of syncope when getting a CT scan of the head and was found to have hypoxia requiring ~4L/min supplemental O2. MRI negative, CT negative. PMH significant for breast CA, DM, HTN, L foot fracture surgery 2006, R THR 01/12/2023.   Clinical Impression   Pt presents with decline in function and safety with ADLs and ADL mobility with impaired strength, balance and endurance. PTA pt lived at an ALF and reports that she was Ind with ADLs/selfcare, used a RW and Sup for showers.  Pt initially asking to sit up more for lunch however became agitated/unwilling to cooperate once sitting up as well as adamantly declining for most ADLs to be assessed. Pt eventually agreed to sit in the recliner for lunch after talking with her daughter on the phone. Pt would benefit form acute OT services to address impairments to maximize level of function and safety    If plan is discharge home, recommend the following: A lot of help with bathing/dressing/bathroom;A little help with walking and/or transfers;Help with stairs or ramp for entrance    Functional Status Assessment  Patient has had a recent decline in their functional status and demonstrates the ability to make significant improvements in function in a reasonable and predictable amount of time.  Equipment Recommendations  None recommended by OT    Recommendations for Other Services       Precautions / Restrictions Precautions Precautions: Fall Restrictions Weight Bearing Restrictions: No      Mobility Bed Mobility Overal bed mobility: Needs Assistance Bed Mobility: Supine to Sit     Supine to  sit: Contact guard     General bed mobility comments: Close guard for safety as pt transitioned to EOB. Increased time but no assist required.    Transfers Overall transfer level: Needs assistance Equipment used: Rolling walker (2 wheels) Transfers: Sit to/from Stand, Bed to chair/wheelchair/BSC Sit to Stand: Min assist, +2 physical assistance     Step pivot transfers: Contact guard assist, +2 safety/equipment     General transfer comment: Pt required encouragement to sit in chair initially. Assist for power up to full stand. Pt able to take pivotal steps around to the recliner without significant assistance, however close hands on guarding provided for safety.      Balance Overall balance assessment: Needs assistance   Sitting balance-Leahy Scale: Fair     Standing balance support: No upper extremity supported, During functional activity Standing balance-Leahy Scale: Fair                             ADL either performed or assessed with clinical judgement   ADL Overall ADL's : Needs assistance/impaired Eating/Feeding: Set up;Sitting   Grooming: Wash/dry hands;Wash/dry face;Set up;Sitting                   Toilet Transfer: Minimal assistance;+2 for safety/equipment;Rolling walker (2 wheels);Ambulation Toilet Transfer Details (indicate cue type and reason): simulated to chair with RW fomr EOB           General ADL Comments: pt agitated once sititng EOB and refused most ADL tasks to  be assessed     Vision Baseline Vision/History: 0 No visual deficits Ability to See in Adequate Light: 0 Adequate Patient Visual Report: No change from baseline       Perception         Praxis         Pertinent Vitals/Pain Pain Assessment Pain Assessment: Faces Faces Pain Scale: Hurts a little bit Pain Descriptors / Indicators: Headache, Dull Pain Intervention(s): Monitored during session, Limited activity within patient's tolerance, Repositioned      Extremity/Trunk Assessment Upper Extremity Assessment Upper Extremity Assessment: Generalized weakness   Lower Extremity Assessment Lower Extremity Assessment: Defer to PT evaluation   Cervical / Trunk Assessment Cervical / Trunk Assessment: Kyphotic   Communication Communication Communication: No apparent difficulties Cueing Techniques: Verbal cues;Gestural cues   Cognition Arousal: Alert Behavior During Therapy: WFL for tasks assessed/performed, Agitated Overall Cognitive Status: Impaired/Different from baseline Area of Impairment: Orientation, Attention, Memory, Following commands, Safety/judgement, Awareness, Problem solving                 Orientation Level: Disoriented to, Situation   Memory: Decreased short-term memory Following Commands: Follows one step commands with increased time Safety/Judgement: Decreased awareness of safety   Problem Solving: Slow processing, Requires verbal cues General Comments: pt with some confusion, becoming agitated by being asked to move UEs, scoot to [facility] and to retunr to supine     General Comments       Exercises     Shoulder Instructions      Home Living Family/patient expects to be discharged to:: Assisted living Living Arrangements: Alone Available Help at Discharge: Family;Available PRN/intermittently Type of Home: Other(Comment) (ALF)       Home Layout: One level Alternate Level Stairs-Number of Steps: 15   Bathroom Shower/Tub: Tub/shower unit   Bathroom Toilet: Handicapped height     Home Equipment: Agricultural consultant (2 wheels);Grab bars - tub/shower;Shower seat          Prior Functioning/Environment Prior Level of Function : Independent/Modified Independent             Mobility Comments: RW for mobility ADLs Comments: Pt reports independence with ADLs, however states she sits for showering and leans to the side to wash bottom, wlaks to dining room        OT Problem List: Decreased  strength;Impaired balance (sitting and/or standing);Decreased cognition;Pain;Decreased activity tolerance;Obesity;Decreased knowledge of use of DME or AE      OT Treatment/Interventions: Self-care/ADL training;DME and/or AE instruction;Therapeutic activities;Balance training;Therapeutic exercise;Patient/family education    OT Goals(Current goals can be found in the care plan section) Acute Rehab OT Goals Patient Stated Goal: none stated OT Goal Formulation: With patient Time For Goal Achievement: 04/17/23 Potential to Achieve Goals: Good ADL Goals Pt Will Perform Grooming: with contact guard assist;with supervision;with set-up;standing Pt Will Perform Upper Body Bathing: with supervision Pt Will Perform Lower Body Bathing: with min assist;with contact guard assist Pt Will Perform Upper Body Dressing: with supervision Pt Will Perform Lower Body Dressing: with min assist;with contact guard assist Pt Will Transfer to Toilet: with min assist;with contact guard assist;ambulating Pt Will Perform Toileting - Clothing Manipulation and hygiene: with mod assist;with min assist;sit to/from stand  OT Frequency: Min 2X/week    Co-evaluation PT/OT/SLP Co-Evaluation/Treatment: Yes Reason for Co-Treatment: Necessary to address cognition/behavior during functional activity;For patient/therapist safety;To address functional/ADL transfers PT goals addressed during session: Mobility/safety with mobility;Balance;Proper use of DME OT goals addressed during session: ADL's and self-care      AM-PAC OT "  6 Clicks" Daily Activity     Outcome Measure Help from another person eating meals?: None Help from another person taking care of personal grooming?: A Little Help from another person toileting, which includes using toliet, bedpan, or urinal?: Total Help from another person bathing (including washing, rinsing, drying)?: Total Help from another person to put on and taking off regular upper body clothing?:  Total Help from another person to put on and taking off regular lower body clothing?: Total 6 Click Score: 11   End of Session Equipment Utilized During Treatment: Gait belt;Rolling walker (2 wheels) Nurse Communication: Mobility status  Activity Tolerance: Treatment limited secondary to agitation;Patient limited by pain Patient left: in chair;with call bell/phone within reach;with chair alarm set  OT Visit Diagnosis: Other abnormalities of gait and mobility (R26.89);Unsteadiness on feet (R26.81);Muscle weakness (generalized) (M62.81)                Time: 4098-1191 OT Time Calculation (min): 25 min Charges:  OT General Charges $OT Visit: 1 Visit OT Evaluation $OT Eval Moderate Complexity: 1 Mod    Galen Manila 04/03/2023, 3:21 PM

## 2023-04-03 NOTE — Consult Note (Signed)
[facility] Customer service manager [facility]) Accountable Care Organization (ACO) [facility] Liaison Note  04/03/2023  Laurie Fox [birth year] [id number]  Location: [facility] Liaison met patient at bedside at [facility]. Covering Laurie Shanks, RN  Insurance: [employer]   Laurie Fox is a 89 y.o. 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 The patient was screened for readmission hospitalization with noted medium risk score for unplanned readmission risk with 2 IP/1 ED in 6 months.  The patient was assessed for potential [facility]) Care Management service needs for post hospital transition for care coordination. Review of patient's electronic medical record reveals patient was admitted with Near Syncope secondary to pneumonia. Liaison attempted bedside visit today however pt was sleeping with no one present at bedside.  Pt is an [facility] pt and hospital liaison will collaborate with that team concerning pt's discharge disposition at that time.   Plan: [facility] Liaison will continue to follow progress and disposition to asess for post hospital community care coordination/management needs.  Referral request for community care coordination: pending disposition.   [facility] Care Management/Population Health does not replace or interfere with any arrangements made by the Inpatient Transition of Care team.   For questions contact:    Laurie Cousin, RN, [facility] Liaison [place]   Population Health Office Hours MTWF  8:00 am-6:00 pm [phone] mobile [phone] [Office toll free line] Office Hours are M-F 8:30 - 5 pm [email or web address]

## 2023-04-04 ENCOUNTER — Inpatient Hospital Stay (HOSPITAL_COMMUNITY): Payer: Medicare Other

## 2023-04-04 DIAGNOSIS — I10 Essential (primary) hypertension: Secondary | ICD-10-CM | POA: Diagnosis not present

## 2023-04-04 DIAGNOSIS — R55 Syncope and collapse: Secondary | ICD-10-CM | POA: Diagnosis not present

## 2023-04-04 DIAGNOSIS — J189 Pneumonia, unspecified organism: Secondary | ICD-10-CM | POA: Diagnosis not present

## 2023-04-04 DIAGNOSIS — I509 Heart failure, unspecified: Secondary | ICD-10-CM | POA: Diagnosis not present

## 2023-04-04 LAB — BASIC METABOLIC PANEL
Anion gap: 9 (ref 5–15)
BUN: 32 mg/dL — ABNORMAL HIGH (ref 8–23)
CO2: 27 mmol/L (ref 22–32)
Calcium: 9.7 mg/dL (ref 8.9–10.3)
Chloride: 102 mmol/L (ref 98–111)
Creatinine, Ser: 1.25 mg/dL — ABNORMAL HIGH (ref 0.44–1.00)
GFR, Estimated: 41 mL/min — ABNORMAL LOW (ref >60)
Glucose, Bld: 178 mg/dL — ABNORMAL HIGH (ref 70–99)
Potassium: 3.8 mmol/L (ref 3.5–5.1)
Sodium: 138 mmol/L (ref 135–145)

## 2023-04-04 LAB — GLUCOSE, CAPILLARY
Glucose-Capillary: 162 mg/dL — ABNORMAL HIGH (ref 70–99)
Glucose-Capillary: 161 mg/dL — ABNORMAL HIGH (ref 70–99)
Glucose-Capillary: 156 mg/dL — ABNORMAL HIGH (ref 70–99)
Glucose-Capillary: 169 mg/dL — ABNORMAL HIGH (ref 70–99)

## 2023-04-04 MED ORDER — AMOXICILLIN-POT CLAVULANATE 500-125 MG PO TABS
1.0000 | ORAL_TABLET | Freq: Two times a day (BID) | ORAL | Status: AC
  Administered 2023-04-04 – 2023-04-06 (×5): 1 via ORAL
  Filled 2023-04-04 (×5): qty 1

## 2023-04-04 MED ORDER — AMLODIPINE BESYLATE 5 MG PO TABS
5.0000 mg | ORAL_TABLET | Freq: Every day | ORAL | Status: DC
  Administered 2023-04-04 – 2023-04-08 (×5): 5 mg via ORAL
  Filled 2023-04-04 (×5): qty 1

## 2023-04-04 MED ORDER — AMLODIPINE BESYLATE 5 MG PO TABS
5.0000 mg | ORAL_TABLET | Freq: Once | ORAL | Status: AC
  Administered 2023-04-04: 5 mg via ORAL
  Filled 2023-04-04: qty 1

## 2023-04-04 MED ORDER — FUROSEMIDE 40 MG PO TABS: 40.0000 mg | ORAL_TABLET | Freq: Every day | ORAL | Status: DC

## 2023-04-04 NOTE — Plan of Care (Signed)
  Problem: Education: Goal: Knowledge of General Education information will improve Description Including pain rating scale, medication(s)/side effects and non-pharmacologic comfort measures Outcome: Progressing   

## 2023-04-04 NOTE — Care Management Important Message (Signed)
Important Message  Patient Details  Name: Laurie Fox MRN: [id number] Date of Birth: 1931-10-11   Medicare Important Message Given:  Yes     Dorena Bodo 04/04/2023, 3:17 PM

## 2023-04-04 NOTE — Progress Notes (Signed)
Heart Failure Navigator Progress Note  Assessed for Heart & Vascular TOC clinic readiness.  Patient does not meet criteria due to heart failure exacerbation driving by uncontrolled hypertension. Noted to be agitated and uncooperative with PT.  Navigator available for reassessment of patient.   Ashleyann Shoun,RN, BSN,MSN Heart Failure Nurse Navigator. Contact by secure chat only.

## 2023-04-04 NOTE — Progress Notes (Signed)
Physical Therapy Treatment Patient Details Name: Laurie Fox MRN: [id number] DOB: May 28, 1932 Today's Date: 04/04/2023   History of Present Illness Pt is an 89 y/o female who was at her PCP's office 04/02/2023 when she had an episode of drooling and inability to speak. She was found to have elevated HTN and was brought to the ED. She then had an episode of syncope when getting a CT scan of the head and was found to have hypoxia requiring ~4L/min supplemental O2. MRI negative, CT negative. PMH significant for breast CA, DM, HTN, L foot fracture surgery 2006, R THR 01/12/2023.    PT Comments  Patient very difficult to arouse and RN called to room. Sats only 89% on 1L and RN increased to 2L with sats up to 91%. With repeated max stimulation pt would respond and agreed to sit EOB due to wet linens. More awake once sitting EOB and able to transfer to chair with +1 mod assist and incr time. Her gown was changed and pt began shivering. Remained lethargic and not safe to leave her up in recliner. Assisted back to bed with mod assist and incr time --"don't push me." Returned to supine and RN with pt on departure. Noted this is a dramatic change in function from yesterday. Unclear if pt will be safe to return to ALF. Will follow.    If plan is discharge home, recommend the following: Assistance with cooking/housework;Direct supervision/assist for medications management;Assist for transportation;Help with stairs or ramp for entrance;Supervision due to cognitive status;Two people to help with walking and/or transfers;A lot of help with bathing/dressing/bathroom;Assistance with feeding;Direct supervision/assist for financial management   Can travel by private vehicle        Equipment Recommendations  None recommended by PT    Recommendations for Other Services       Precautions / Restrictions Precautions Precautions: Fall Restrictions Weight Bearing Restrictions: No     Mobility  Bed  Mobility Overal bed mobility: Needs Assistance Bed Mobility: Supine to Sit, Sit to Supine     Supine to sit: Max assist, HOB elevated Sit to supine: Min assist   General bed mobility comments: very lethargic, did assist with moving legs off EOB and trying to raise torso; assist to raise legs onto bed    Transfers Overall transfer level: Needs assistance Equipment used: 1 person hand held assist Transfers: Sit to/from Stand, Bed to chair/wheelchair/BSC Sit to Stand: Mod assist, +2 safety/equipment   Step pivot transfers: +2 safety/equipment, Min assist       General transfer comment: Understood her bed linens were completely wet and wanted to sit in chair for linen change (including her gown); remained lethargic and ?safe to sit up and returned to bed    Ambulation/Gait               General Gait Details: pt unable to progress   Stairs             Wheelchair Mobility     Tilt Bed    Modified Rankin (Stroke Patients Only)       Balance Overall balance assessment: Needs assistance Sitting-balance support: Feet supported, No upper extremity supported Sitting balance-Leahy Scale: Fair     Standing balance support: During functional activity, Single extremity supported Standing balance-Leahy Scale: Poor                              Cognition Arousal: Alert Behavior During Therapy: Flat affect  Overall Cognitive Status: Impaired/Different from baseline Area of Impairment: Orientation, Attention, Following commands, Awareness, Problem solving                 Orientation Level: Disoriented to, Situation, Place, Time Current Attention Level: Focused   Following Commands: Follows one step commands with increased time Safety/Judgement: Decreased awareness of safety Awareness: Intellectual Problem Solving: Slow processing, Requires verbal cues, Requires tactile cues General Comments: very lethargic on arrival; RN in to assist with  stimulating pt; improved slightly once seated EOB; required max cues for transfer to chair and then back to bed        Exercises      General Comments General comments (skin integrity, edema, etc.): HR 48 sats 90% on 1L BP 200/61. RN in to assess due to pt's lethargy. Incr to 2L with sats down to 87% with standing and recovered with seated rest to 90%      Pertinent Vitals/Pain Pain Assessment Pain Assessment: Faces Faces Pain Scale: No hurt    Home Living                          Prior Function            PT Goals (current goals can now be found in the care plan section) Acute Rehab PT Goals Patient Stated Goal: unable to state Time For Goal Achievement: 04/17/23 Potential to Achieve Goals: Good Progress towards PT goals: Not progressing toward goals - comment    Frequency    Min 1X/week      PT Plan      Co-evaluation              AM-PAC PT "6 Clicks" Mobility   Outcome Measure  Help needed turning from your back to your side while in a flat bed without using bedrails?: Total Help needed moving from lying on your back to sitting on the side of a flat bed without using bedrails?: Total Help needed moving to and from a bed to a chair (including a wheelchair)?: A Lot Help needed standing up from a chair using your arms (e.g., wheelchair or bedside chair)?: A Lot Help needed to walk in hospital room?: Total Help needed climbing 3-5 steps with a railing? : Total 6 Click Score: 8    End of Session Equipment Utilized During Treatment: Oxygen;Gait belt Activity Tolerance: Patient limited by lethargy Patient left: with call bell/phone within reach;in bed;with bed alarm set;with nursing/sitter in room Nurse Communication: Mobility status;Other (comment) (difficult to arouse) PT Visit Diagnosis: Unsteadiness on feet (R26.81);Difficulty in walking, not elsewhere classified (R26.2)     Time: 2703-5009 PT Time Calculation (min) (ACUTE ONLY): 29  min  Charges:    $Therapeutic Activity: 23-37 mins PT General Charges $$ ACUTE PT VISIT: 1 Visit                      Jerolyn Center, PT Acute Rehabilitation Services  Office [phone]    Zena Amos 04/04/2023, 4:45 PM

## 2023-04-04 NOTE — Care Management Important Message (Signed)
Important Message  Patient Details  Name: Laurie Fox MRN: [id number] Date of Birth: 01-25-1932   Medicare Important Message Given:  Yes     Omelia Marquart 04/04/2023, 3:12 PM

## 2023-04-04 NOTE — TOC Initial Note (Signed)
Transition of Care [facility]) - Initial/Assessment Note    Patient Details  Name: Laurie Fox MRN: [id number] Date of Birth: July 31, 1932  Transition of Care [facility]) CM/SW Contact:    Mearl Latin, LCSW Phone Number: 04/04/2023, 2:53 PM  Clinical Narrative:                 CSW spoke with patient's son, Laurie Fox. He reported that patient used a walker at [facility] but recently has used a wheelchair since her hip surgery (patient refused to do knee surgeries). He stated she does not use oxygen there so CSW will monitor for need at ALF. She was doing therapy at [facility] but has gotten to the point here that she does not want to move. He stated she can be resistant and that hospital staff can call him anytime as he is the only one who can typically talk her down. CSW left voicemail for [employer].   Expected Discharge Plan: Assisted Living Barriers to Discharge: Continued Medical Work up   Patient Goals and CMS Choice Patient states their goals for this hospitalization and ongoing recovery are:: Return to ALF CMS Medicare.gov Compare Post Acute Care list provided to:: Patient Represenative (must comment) (Son) Choice offered to / list presented to : Adult Children, Patient [place] ownership interest in [facility].provided to:: Adult Children    Expected Discharge Plan and Services In-house Referral: Clinical Social Work   Post Acute Care Choice: Home Health Living arrangements for the past 2 months: Assisted Living Facility                                      Prior Living Arrangements/Services Living arrangements for the past 2 months: Assisted Living Facility Lives with:: Facility Resident Patient language and need for interpreter reviewed:: Yes Do you feel safe going back to the place where you live?: Yes      Need for Family Participation in Patient Care: Yes (Comment) Care giver support system in place?: Yes (comment) Current home services: DME  Gilmer Mor, RW, Wheelchair) Criminal Activity/Legal Involvement Pertinent to Current Situation/Hospitalization: No - Comment as needed  Activities of Daily Living Home Assistive Devices/Equipment: Environmental consultant (specify type), Oxygen, Shower chair without back, Grab bars in shower ADL Screening (condition at time of admission) Patient's cognitive ability adequate to safely complete daily activities?: Yes Is the patient deaf or have difficulty hearing?: Yes Does the patient have difficulty seeing, even when wearing glasses/contacts?: No Does the patient have difficulty concentrating, remembering, or making decisions?: Yes Patient able to express need for assistance with ADLs?: Yes Does the patient have difficulty dressing or bathing?: No Independently performs ADLs?: Yes (appropriate for developmental age) Does the patient have difficulty walking or climbing stairs?: Yes Weakness of Legs: Both Weakness of Arms/Hands: None  Permission Sought/Granted Permission sought to share information with : Facility Medical sales representative, Family Supports Permission granted to share information with : Yes, Verbal Permission Granted  Share Information with NAME: Laurie Fox  Permission granted to share info w AGENCY: ALF  Permission granted to share info w Relationship: Son  Permission granted to share info w Contact Information: [phone]  Emotional Assessment Appearance:: Appears stated age Attitude/Demeanor/Rapport: Unable to Assess Affect (typically observed): Unable to Assess Orientation: : Oriented to Self, Oriented to Place, Oriented to  Time, Oriented to Situation Alcohol / Substance Use: Not Applicable Psych Involvement: No (comment)  Admission diagnosis:  Transient loss of consciousness [R55] Pneumonia due to infectious organism, unspecified laterality, unspecified part of lung [J18.9] Hypertension, unspecified type [I10] Congestive heart failure, unspecified HF chronicity, unspecified heart failure  type [facility]) [I50.9] Patient Active Problem List   Diagnosis Date Noted   Congestive heart failure (HCC) 04/03/2023   Pneumonia due to infectious organism 04/03/2023   Near syncope 04/02/2023   Malnutrition of moderate degree 01/12/2023   Closed displaced fracture of right femoral neck (HCC) 01/10/2023   HTN (hypertension) 01/10/2023   Type 2 diabetes mellitus with chronic kidney disease, without long-term current use of insulin (HCC) 01/10/2023   Hip fracture (HCC) 01/10/2023   Breast cancer of upper-inner quadrant of right female breast (HCC) 07/17/2013   PCP:  Shireen Quan, DO Pharmacy:   [id number] - [place],  - [address] [place] [address] Phone: [phone] Fax: [phone]  [facility]) - [place], [place] - [address] [facility] [address] [place] [address] [place] [address] Phone: [phone] Fax: [phone]  [facility] - [place], [place] - [address] [place] [address] Phone: [phone] Fax: [phone]     Social Determinants of Health (SDOH) Social History: SDOH Screenings   Food Insecurity: No Food Insecurity (04/02/2023)  Housing: Low Risk  (04/02/2023)  Transportation Needs: No Transportation Needs (04/02/2023)  Utilities: Not At Risk (04/02/2023)  Tobacco Use: Unknown (01/12/2023)   SDOH Interventions:     Readmission Risk Interventions    01/11/2023    2:10 PM  Readmission Risk Prevention Plan  Transportation Screening Complete  PCP or Specialist Appt within 5-7 Days Complete  Home Care Screening Complete  Medication Review (RN CM) Complete

## 2023-04-04 NOTE — Progress Notes (Signed)
PROGRESS NOTE        PATIENT DETAILS Name: Laurie Fox Age: 89 y.o. Sex: female Date of Birth: 1931/09/16 Admit Date: 04/02/2023 Admitting Physician Tonye Royalty, DO ONG:[id number], Ronaldo Miyamoto, DO  Brief Summary: 89 year old with HTN, HLD, DM-2, chronic HFpEF-who apparently lives at ALF-was at PCPs office when she had an episode where she started drooling, and was unable to speak-she was found to have elevated hypertension-and brought to the ED where she had a episode of syncope when getting a CT scan of the head. She was also found to have hypoxia requiring around 4 L of oxygen. She was then admitted to the hospitalist service.   Significant events: 8/27>> admit to [facility]  Significant studies: 8/27>>CT head: No acute abnormalities 8/27>>CT angiogram chest: No PE-bibasilar atelectasis versus pneumonia. 8/28>> MRI brain: No acute CVA  Significant microbiology data: None  Procedures: None  Consults: None  Subjective: Sleeping when walking-easily awoke-answers all questions appropriately--no major issues overnight.  Objective: Vitals: Blood pressure (!) 137/37, pulse (!) 49, temperature 97.8 F (36.6 C), resp. rate 18, height 5\' 2"  (1.575 m), weight 77.1 kg, SpO2 96%.   Exam: Gen Exam:Alert awake-not in any distress HEENT:atraumatic, normocephalic Chest: B/L clear to auscultation anteriorly CVS:S1S2 regular Abdomen:soft non tender, non distended Extremities:no edema Neurology: Non focal Skin: no rash  Pertinent Labs/Radiology:    Latest Ref Rng & Units 04/03/2023    2:12 AM 04/02/2023    7:01 PM 01/15/2023    6:38 AM  CBC  WBC 4.0 - 10.5 K/uL 7.9  7.3  7.5   Hemoglobin 12.0 - 15.0 g/dL 8.8  8.6  8.1   Hematocrit 36.0 - 46.0 % 29.6  28.3  25.8   Platelets 150 - 400 K/uL 252  270  336     Lab Results  Component Value Date   NA 138 04/04/2023   K 3.8 04/04/2023   CL 102 04/04/2023   CO2 27 04/04/2023      Assessment/Plan: Syncope  while in the emergency room Unclear etiology-but suspect related to uncontrolled hypertension. Telemetry negative so far Recent echo on 8/16-EF preserved EEG negative   Drooling/possible right facial droop while at PCPs office Unclear exactly what her deficits were-apparently this was very brief-and occurred prior to her presenting to the ED could have been related to uncontrolled hypertension MRI brain negative for CVA-doubt further workup required.   Hypertensive crisis SBP in the 220s-230s systolic on initial presentation BP overall better but still fluctuating-and on the higher side Continue HCTZ/irbesartan-add low-dose amlodipine Coreg on hold due to concern for bradycardia. Follow/ optimize.   Sinus bradycardia Mostly nocturnal-asymptomatic Beta-blocker on hold Telemetry monitoring TSH-stable   Acute hypoxic respiratory failure secondary to presumed aspiration PNA and HFpEF exacerbation in the setting of uncontrolled hypertension Overall improved-Down to 1 L of oxygen Volume status better Antibiotics changed to Augmentin Stop IV Lasix-changed to oral furosemide Titrate off oxygen today Ambulate/mobilize Pulmonary tolerating/incentive spirometry   CKD stage IIIa Close to baseline   HLD Statin   GERD PPI   DM-2 (A1c 6.7 on 6/6) SSI Resume oral hypoglycemics on discharge  Obesity: Estimated body mass index is 31.09 kg/m as calculated from the following:   Height as of this encounter: 5\' 2"  (1.575 m).   Weight as of this encounter: 77.1 kg.   Code status:   Code Status: Full Code  DVT Prophylaxis: enoxaparin (LOVENOX) injection 40 mg Start: 04/03/23 1000 SCDs Start: 04/03/23 0145   Family Communication: Son-Michael-[phone]-left voicemail on 8/29   Disposition Plan: Status is: Inpatient Remains inpatient appropriate because: Severity of illness   Planned Discharge Destination:Assisted living   Diet: Diet Order             Diet heart  healthy/carb modified Room service appropriate? Yes; Fluid consistency: Thin  Diet effective now                     Antimicrobial agents: Anti-infectives (From admission, onward)    Start     Dose/Rate Route Frequency Ordered Stop   04/03/23 2200  cefTRIAXone (ROCEPHIN) 1 g in sodium chloride 0.9 % 100 mL IVPB  Status:  Discontinued        1 g 200 mL/hr over 30 Minutes Intravenous Every 24 hours 04/03/23 0145 04/03/23 1214   04/03/23 2000  amoxicillin-clavulanate (AUGMENTIN) 875-125 MG per tablet 1 tablet        1 tablet Oral Every 12 hours 04/03/23 1214     04/03/23 1000  doxycycline (VIBRAMYCIN) 100 mg in sodium chloride 0.9 % 250 mL IVPB  Status:  Discontinued        100 mg 125 mL/hr over 120 Minutes Intravenous Every 12 hours 04/03/23 0145 04/03/23 1214   04/02/23 2045  cefTRIAXone (ROCEPHIN) 1 g in sodium chloride 0.9 % 100 mL IVPB        1 g 200 mL/hr over 30 Minutes Intravenous  Once 04/02/23 2034 04/02/23 2150   04/02/23 2045  doxycycline (VIBRAMYCIN) 100 mg in sodium chloride 0.9 % 250 mL IVPB        100 mg 125 mL/hr over 120 Minutes Intravenous  Once 04/02/23 2034 04/02/23 2350        MEDICATIONS: Scheduled Meds:  amLODipine  5 mg Oral Daily   amoxicillin-clavulanate  1 tablet Oral Q12H   enoxaparin (LOVENOX) injection  40 mg Subcutaneous Daily   furosemide  40 mg Intravenous Daily   irbesartan  150 mg Oral Daily   And   hydrochlorothiazide  12.5 mg Oral Daily   insulin aspart  0-15 Units Subcutaneous TID WC   insulin aspart  0-5 Units Subcutaneous QHS   pantoprazole  40 mg Oral Daily   simvastatin  20 mg Oral Daily   sodium chloride flush  3 mL Intravenous Q12H   Continuous Infusions: PRN Meds:.acetaminophen **OR** acetaminophen, albuterol, bisacodyl, hydrALAZINE, HYDROcodone-acetaminophen, ipratropium, ondansetron **OR** ondansetron (ZOFRAN) IV, senna-docusate, traZODone   I have personally reviewed following labs and imaging studies  LABORATORY  DATA: CBC: Recent Labs  Lab 04/02/23 1901 04/03/23 0212  WBC 7.3 7.9  NEUTROABS 6.0  --   HGB 8.6* 8.8*  HCT 28.3* 29.6*  MCV 74.7* 75.7*  PLT 270 252    Basic Metabolic Panel: Recent Labs  Lab 04/02/23 1901 04/03/23 0212 04/04/23 0316  NA 139 139 138  K 4.4 4.0 3.8  CL 102 102 102  CO2 27 25 27   GLUCOSE 238* 233* 178*  BUN 33* 32* 32*  CREATININE 1.20* 1.18* 1.25*  CALCIUM 9.3 9.6 9.7    GFR: Estimated Creatinine Clearance: 29.9 mL/min (A) (by C-G formula based on SCr of 1.25 mg/dL (H)).  Liver Function Tests: Recent Labs  Lab 04/02/23 1901 04/03/23 0212  AST 14* 25  ALT 20 32  ALKPHOS 130* 142*  BILITOT 0.6 0.9  PROT 6.0* 5.7*  ALBUMIN 3.8 3.4*   No results for  input(s): "LIPASE", "AMYLASE" in the last 168 hours. No results for input(s): "AMMONIA" in the last 168 hours.  Coagulation Profile: Recent Labs  Lab 04/02/23 1901  INR 1.1    Cardiac Enzymes: No results for input(s): "CKTOTAL", "CKMB", "CKMBINDEX", "TROPONINI" in the last 168 hours.  BNP (last 3 results) No results for input(s): "PROBNP" in the last 8760 hours.  Lipid Profile: Recent Labs    04/03/23 0212  CHOL 115  HDL 47  LDLCALC 56  TRIG 61  CHOLHDL 2.4    Thyroid Function Tests: Recent Labs    04/03/23 0212  TSH 4.296    Anemia Panel: No results for input(s): "VITAMINB12", "FOLATE", "FERRITIN", "TIBC", "IRON", "RETICCTPCT" in the last 72 hours.  Urine analysis:    Component Value Date/Time   COLORURINE STRAW (A) 04/03/2023 0044   APPEARANCEUR CLEAR 04/03/2023 0044   LABSPEC 1.011 04/03/2023 0044   PHURINE 5.0 04/03/2023 0044   GLUCOSEU NEGATIVE 04/03/2023 0044   HGBUR NEGATIVE 04/03/2023 0044   BILIRUBINUR NEGATIVE 04/03/2023 0044   KETONESUR NEGATIVE 04/03/2023 0044   PROTEINUR NEGATIVE 04/03/2023 0044   NITRITE NEGATIVE 04/03/2023 0044   LEUKOCYTESUR NEGATIVE 04/03/2023 0044    Sepsis Labs: Lactic Acid, Venous No results found for:  "LATICACIDVEN"  MICROBIOLOGY: No results found for this or any previous visit (from the past 240 hour(s)).  RADIOLOGY STUDIES/RESULTS: EEG adult  Result Date: 04/03/2023 Charlsie Quest, MD     04/04/2023 11:50 AM Patient Name: Laurie Fox MRN: [id number] Epilepsy Attending: Charlsie Quest Referring Physician/Provider: Maretta Bees, MD Date: 04/03/2023 Duration: 26.08 mins Patient history: 89yo F with an episode of unresponsiveness getting eeg to evaluate for seizure. Level of alertness: Awake, asleep AEDs during EEG study: None Technical aspects: This EEG study was done with scalp electrodes positioned according to the 10-20 International system of electrode placement. Electrical activity was reviewed with band pass filter of 1-70Hz , sensitivity of 7 uV/mm, display speed of 50mm/sec with a 60Hz  notched filter applied as appropriate. EEG data were recorded continuously and digitally stored.  Video monitoring was available and reviewed as appropriate. Description: The posterior dominant rhythm consists of 8 Hz activity of moderate voltage (25-35 uV) seen predominantly in posterior head regions, symmetric and reactive to eye opening and eye closing. Sleep was characterized by sleep spindles (12 to 14 Hz), maximal frontocentral region. EEG showed intermittent generalized 3 to 6 Hz theta-delta slowing. Hyperventilation and photic stimulation were not performed.   ABNORMALITY - Intermittent slow, generalized IMPRESSION: This study is suggestive of mild diffuse encephalopathy, nonspecific etiology. No seizures or epileptiform discharges were seen throughout the recording. Charlsie Quest   MR BRAIN WO CONTRAST  Result Date: 04/03/2023 CLINICAL DATA:  Transient episode of unresponsiveness, facial drooping, drooling, and difficulty speaking. EXAM: MRI HEAD WITHOUT CONTRAST TECHNIQUE: Multiplanar, multiecho pulse sequences of the brain and surrounding structures were obtained without intravenous  contrast. COMPARISON:  CT head 1 day prior FINDINGS: Brain: There is no acute intracranial hemorrhage, extra-axial fluid collection, or acute infarct Parenchymal volume is within expected limits for age. The ventricles are normal in size. Patchy FLAIR signal abnormality in the subcortical and periventricular white matter likely reflects sequela of underlying chronic small-vessel ischemic change, mild for age. The pituitary and suprasellar region are normal. There is no mass lesion. There is no mass effect or midline shift. Vascular: Normal flow voids. Skull and upper cervical spine: Normal marrow signal. Sinuses/Orbits: There is mild mucosal thickening in the paranasal sinuses. The globes and  orbits are unremarkable. Other: The mastoid air cells and middle ear cavities are clear. IMPRESSION: No acute intracranial pathology. Electronically Signed   By: Lesia Hausen M.D.   On: 04/03/2023 10:48   CT Angio Chest PE W and/or Wo Contrast  Result Date: 04/02/2023 CLINICAL DATA:  Concern for pulmonary embolism. Shortness of breath. EXAM: CT ANGIOGRAPHY CHEST WITH CONTRAST TECHNIQUE: Multidetector CT imaging of the chest was performed using the standard protocol during bolus administration of intravenous contrast. Multiplanar CT image reconstructions and MIPs were obtained to evaluate the vascular anatomy. RADIATION DOSE REDUCTION: This exam was performed according to the departmental dose-optimization program which includes automated exposure control, adjustment of the mA and/or kV according to patient size and/or use of iterative reconstruction technique. CONTRAST:  75mL OMNIPAQUE IOHEXOL 350 MG/ML SOLN COMPARISON:  Chest radiograph dated 03/19/2023. FINDINGS: Cardiovascular: Mild cardiomegaly. No pericardial effusion. There is coronary vascular calcification and calcification of the mitral annulus. Moderate atherosclerotic calcification of the thoracic aorta. No pulmonary artery embolus identified. Mediastinum/Nodes:  No hilar adenopathy. The esophagus is grossly unremarkable. No mediastinal fluid collection. Lungs/Pleura: Small bilateral pleural effusions and bibasilar atelectasis. Pneumonia is not excluded. Clinical correlation is recommended. There is no pneumothorax. The central airways are patent. Upper Abdomen: No acute abnormality. Musculoskeletal: Diffuse subcutaneous edema and anasarca. Osteopenia with degenerative changes. No acute osseous pathology. Multiple surgical clips in the right breast. Review of the MIP images confirms the above findings. IMPRESSION: 1. No CT evidence of pulmonary embolism. 2. Small bilateral pleural effusions and bibasilar atelectasis. Pneumonia is not excluded. 3.  Aortic Atherosclerosis (ICD10-I70.0). Electronically Signed   By: Elgie Collard M.D.   On: 04/02/2023 20:31   CT HEAD WO CONTRAST  Result Date: 04/02/2023 CLINICAL DATA:  Altered mental status. EXAM: CT HEAD WITHOUT CONTRAST TECHNIQUE: Contiguous axial images were obtained from the base of the skull through the vertex without intravenous contrast. RADIATION DOSE REDUCTION: This exam was performed according to the departmental dose-optimization program which includes automated exposure control, adjustment of the mA and/or kV according to patient size and/or use of iterative reconstruction technique. COMPARISON:  January 06, 2023 FINDINGS: Brain: There is mild cerebral atrophy with widening of the extra-axial spaces and ventricular dilatation. There are areas of decreased attenuation within the white matter tracts of the supratentorial brain, consistent with microvascular disease changes. Vascular: There is marked severity bilateral cavernous carotid artery calcification. Skull: Normal. Negative for fracture or focal lesion. Sinuses/Orbits: No acute finding. Other: None. IMPRESSION: 1. Generalized cerebral atrophy with chronic white matter small vessel ischemic changes. 2. No acute intracranial abnormality. Electronically Signed    By: Aram Candela M.D.   On: 04/02/2023 19:53     LOS: 2 days   Jeoffrey Massed, MD  [facility]    To contact the attending provider between 7A-7P or the covering provider during after hours 7P-7A, please log into the web site [email or web address] and access using universal [place] password for that web site. If you do not have the password, please call the hospital operator.  04/04/2023, 12:54 PM

## 2023-04-04 NOTE — Progress Notes (Signed)
EEG complete - results pending 

## 2023-04-05 ENCOUNTER — Inpatient Hospital Stay (HOSPITAL_COMMUNITY): Payer: Medicare Other

## 2023-04-05 DIAGNOSIS — R001 Bradycardia, unspecified: Secondary | ICD-10-CM

## 2023-04-05 DIAGNOSIS — J189 Pneumonia, unspecified organism: Secondary | ICD-10-CM | POA: Diagnosis not present

## 2023-04-05 DIAGNOSIS — R55 Syncope and collapse: Secondary | ICD-10-CM | POA: Diagnosis not present

## 2023-04-05 DIAGNOSIS — I10 Essential (primary) hypertension: Secondary | ICD-10-CM | POA: Diagnosis not present

## 2023-04-05 DIAGNOSIS — I509 Heart failure, unspecified: Secondary | ICD-10-CM | POA: Diagnosis not present

## 2023-04-05 LAB — BASIC METABOLIC PANEL
Anion gap: 10 (ref 5–15)
BUN: 34 mg/dL — ABNORMAL HIGH (ref 8–23)
CO2: 27 mmol/L (ref 22–32)
Calcium: 9.4 mg/dL (ref 8.9–10.3)
Chloride: 102 mmol/L (ref 98–111)
Creatinine, Ser: 1.26 mg/dL — ABNORMAL HIGH (ref 0.44–1.00)
GFR, Estimated: 41 mL/min — ABNORMAL LOW (ref >60)
Glucose, Bld: 183 mg/dL — ABNORMAL HIGH (ref 70–99)
Potassium: 3.1 mmol/L — ABNORMAL LOW (ref 3.5–5.1)
Sodium: 139 mmol/L (ref 135–145)

## 2023-04-05 LAB — BRAIN NATRIURETIC PEPTIDE: B Natriuretic Peptide: 1068.8 pg/mL — ABNORMAL HIGH (ref 0.0–100.0)

## 2023-04-05 LAB — PROCALCITONIN: Procalcitonin: <0.1 ng/mL

## 2023-04-05 LAB — GLUCOSE, CAPILLARY
Glucose-Capillary: 207 mg/dL — ABNORMAL HIGH (ref 70–99)
Glucose-Capillary: 300 mg/dL — ABNORMAL HIGH (ref 70–99)
Glucose-Capillary: 181 mg/dL — ABNORMAL HIGH (ref 70–99)

## 2023-04-05 MED ORDER — POTASSIUM CHLORIDE CRYS ER 20 MEQ PO TBCR
40.0000 meq | EXTENDED_RELEASE_TABLET | Freq: Once | ORAL | Status: AC
  Administered 2023-04-05: 40 meq via ORAL
  Filled 2023-04-05: qty 2

## 2023-04-05 MED ORDER — HYDRALAZINE HCL 25 MG PO TABS
25.0000 mg | ORAL_TABLET | Freq: Three times a day (TID) | ORAL | Status: DC
  Administered 2023-04-05 – 2023-04-06 (×4): 25 mg via ORAL
  Filled 2023-04-05 (×4): qty 1

## 2023-04-05 MED ORDER — FUROSEMIDE 10 MG/ML IJ SOLN
40.0000 mg | Freq: Every day | INTRAMUSCULAR | Status: DC
  Administered 2023-04-05: 40 mg via INTRAVENOUS
  Filled 2023-04-05: qty 4

## 2023-04-05 NOTE — Evaluation (Signed)
Clinical/Bedside Swallow Evaluation Patient Details  Name: Laurie Fox MRN: [id number] Date of Birth: 10-Jul-1932  Today's Date: 04/05/2023 Time: SLP Start Time (ACUTE ONLY): 1312 SLP Stop Time (ACUTE ONLY): 1326 SLP Time Calculation (min) (ACUTE ONLY): 14 min  Past Medical History:  Past Medical History:  Diagnosis Date   Allergy    Arthritis    knees, osteo, shoulders,fingers   Breast cancer (HCC) 07/15/13   right breast    Diabetes mellitus without complication (HCC)    type II   GERD (gastroesophageal reflux disease)    History of radiation therapy 09/15/13-10/12/13   right breast   Hypertension    Personal history of radiation therapy    Wears glasses    Past Surgical History:  Past Surgical History:  Procedure Laterality Date   BREAST BIOPSY Right 07/15/13   bx=mass 1 0'clock, invasive mammary ca, mammary ca in situ   BREAST LUMPECTOMY     BREAST LUMPECTOMY WITH NEEDLE LOCALIZATION AND AXILLARY SENTINEL LYMPH NODE BX Right 08/11/2013   Procedure: RIGHT BREAST LUMPECTOMY WITH NEEDLE LOCALIZATION AND AXILLARY SENTINEL LYMPH NODE BIOPSY;  Surgeon: Almond Lint, MD;  Location: [facility];  Service: General;  Laterality: Right;   COLONOSCOPY     DILATION AND CURETTAGE OF UTERUS     FRACTURE SURGERY  2006   lt foot   KNEE SURGERY  [id number]   lt and rt knee scopes   TONSILLECTOMY     TOTAL HIP ARTHROPLASTY Right 01/12/2023   Procedure: TOTAL HIP ARTHROPLASTY ANTERIOR APPROACH;  Surgeon: Samson Frederic, MD;  Location: [facility] OR;  Service: Orthopedics;  Laterality: Right;   HPI:  89 y/o female who was at her PCP's office 04/02/2023 when she had an episode of drooling and inability to speak. She was found to have elevated HTN and was brought to the ED. She then had an episode of syncope when getting a CT scan of the head and was found to have hypoxia requiring ~4L/min supplemental O2. MRI negative, CT negative. PMH significant for breast CA, DM, HTN, L foot fracture  surgery 2006, R THR 01/12/2023. Had a significant choking episode with 1/2 Potassium pill given with water and ST consulted.    Assessment / Plan / Recommendation  Clinical Impression  This morning pt had a significant choking episode while taking 1/2 Potassium pill with water. She denies dysphagia other than sometimes difficulty with dry chicken and states she does not have GERD. Her cranial nerve exam was unremarkable, she has adequate dentition and strong cough and is currently on a nasal cannula. Respirations remained stable during intake of water via straw, applesauce and solid texture. There were no indications of compromised airway throughout eval and mastication of solids were timely and complete to clear the oral cavity. Recommend pt continue regular texture, thin liquids and all pills administered whole in puree. If large (Potassium), recommend cut pill in half or thirds. No further ST needed. SLP Visit Diagnosis: Dysphagia, unspecified (R13.10)    Aspiration Risk  Mild aspiration risk    Diet Recommendation Regular;Thin liquid    Liquid Administration via: Straw;Cup Medication Administration: Whole meds with puree (cut larger pills in half or Potassium in thirds) Supervision: Patient able to self feed Compensations: Slow rate;Small sips/bites Postural Changes: Seated upright at 90 degrees    Other  Recommendations Oral Care Recommendations: Oral care BID    Recommendations for follow up therapy are one component of a multi-disciplinary discharge planning process, led by the attending physician.  Recommendations may be updated based on patient status, additional functional criteria and insurance authorization.  Follow up Recommendations No SLP follow up      Assistance Recommended at Discharge    Functional Status Assessment Patient has not had a recent decline in their functional status  Frequency and Duration            Prognosis        Swallow Study   General Date of  Onset: 04/05/23 HPI: 89 y/o female who was at her PCP's office 04/02/2023 when she had an episode of drooling and inability to speak. She was found to have elevated HTN and was brought to the ED. She then had an episode of syncope when getting a CT scan of the head and was found to have hypoxia requiring ~4L/min supplemental O2. MRI negative, CT negative. PMH significant for breast CA, DM, HTN, L foot fracture surgery 2006, R THR 01/12/2023. Had a significant choking episode with 1/2 Potassium pill given with water and ST consulted. Type of Study: Bedside Swallow Evaluation Previous Swallow Assessment:  (none) Diet Prior to this Study: Regular;Thin liquids (Level 0) Temperature Spikes Noted: No Respiratory Status: Nasal cannula History of Recent Intubation: No Behavior/Cognition: Alert;Cooperative;Pleasant mood Oral Cavity Assessment: Within Functional Limits Oral Care Completed by SLP: No Oral Cavity - Dentition: Adequate natural dentition Vision: Functional for self-feeding Self-Feeding Abilities: Able to feed self Patient Positioning: Upright in chair Baseline Vocal Quality: Normal Volitional Cough: Strong Volitional Swallow: Able to elicit    Oral/Motor/Sensory Function Overall Oral Motor/Sensory Function: Within functional limits   Ice Chips Ice chips: Not tested   Thin Liquid Thin Liquid: Within functional limits Presentation: Cup;Straw    Nectar Thick Nectar Thick Liquid: Not tested   Honey Thick Honey Thick Liquid: Not tested   Puree Puree: Within functional limits   Solid     Solid: Within functional limits      Royce Macadamia 04/05/2023,2:08 PM

## 2023-04-05 NOTE — Consult Note (Addendum)
ELECTROPHYSIOLOGY CONSULT NOTE    Patient ID: Laurie Fox MRN: [id number], DOB/AGE: May 07, 1932 89 y.o.  Admit date: 04/02/2023 Date of Consult: 04/05/2023  Primary Physician: Shireen Quan, DO Primary Cardiologist: None  Electrophysiologist: New   Referring Provider: Dr. Jerral Ralph  Patient Profile: Laurie Fox is a 89 y.o. female with a history of BR CA on hormone therapy, HTN, HLD, DM2, CKD3, and chronic diastolic CHF who is being seen today for the evaluation of syncope and bradycardia at the request of Dr. Jerral Ralph.  HPI:  Laurie Fox is a 89 y.o. female with medical history as asbove.   Pt presented to [facility] from PCP after she had an episode of transient unresponsiveness, facial drooping, drooling, and difficulty speaking. Resolved spontaneously after ~ 3 minutes. Found to be hypertensive and hypoxic in the 80s.      Pt taken for CT where she had an additional episode of unresponsiveness, hypertensive into 230s and bradycardia into 50s. Coreg held with bradycardia  CT head showed generalized atrophy with chronic white matter changes, Non-acute  CTA chest without PE, + small bilateral pleural effusions  Brain MRI showed no acute intracranial pathology  Syncope felt to be related to uncontrolled HTN.   Pt also treated for aspiration PNA and had been improving.   This am while taking a pill pt got choked up and had brief syncopal vs near syncopal episode. This correlated with a 6 second pause on telemetry and EP was asked to weigh in.   She is feeling OK currently, sitting up in bed, leaning forward. Still with some cough and SOB. She remembers this morning clearly, and denies syncope. She states she got choked up yesterday morning as well, gave up, and had to take the pills differently, but today she "pushed through". Prior to admission events, denies any syncope, and specifically denies getting choked on food/water leading to near syncope.    Labs Potassium3.1*  (08/30 0327)   Creatinine, ser  1.26* (08/30 0327) PLT  252 (08/28 0212) HGB  8.8* (08/28 0212) WBC 7.9 (08/28 0212) Troponin I (High Sensitivity)17 (08/28 4098).    Allergies, Medical, Surgical, Social, and Family Histories have been reviewed and are referenced here-in when relevant for medical decision making.    Physical Exam: Vitals:   04/05/23 0400 04/05/23 0500 04/05/23 0600 04/05/23 0826  BP:   (!) 191/42 (!) 195/46  Pulse: (!) 53 (!) 56 (!) 57 (!) 50  Resp: (!) 23 (!) 32 (!) 30 (!) 24  Temp: 98.4 F (36.9 C)  98.4 F (36.9 C) 97.9 F (36.6 C)  TempSrc:   Oral Oral  SpO2: 94% 94% 94% 92%  Weight:      Height:        GEN- NAD, A&O x 3, normal affect HEENT: Normocephalic, atraumatic Lungs- CTAB, Normal effort.  Heart- Regular rate and rhythm, No M/G/R.  GI- Soft, NT, ND.  Extremities- No clubbing, cyanosis, or edema   EKG: on arrival showed sinus bradycardia at 59 bpm with iRBBB. (personally reviewed)  Similar to EKG 01/10/23, though more of a LBBB pattern at that time  TELEMETRY: sinus bradycardia 50s,6-8 pauses associated with episode this am, Pt had concomitant P-P and R-R prolongation consistent with vagal response.  (personally reviewed)  Additional studies reviewed: Echo 03/22/2023 LVEF 55-60%, Grade 2 DD, Severe LAE, Mod RAE  Assessment/Plan:  Sinus pause Vagal episode Likely vagal in setting of getting choked up on her pills with P-P and R-R prolongation No history  of swallow syncope, or any syncope prior to this admission.  Avoid AV nodal ablation.  No urgent indication for pacing.  HR into mid 70s with hand squeezing  Sinus bradycardia Coreg held on arrival Has been essentially asymptomatic, apart from episode with pause this am.  Avoid AV nodal agents for now.   AMS Syncope Head imaging all non-acute as above.  EEG negative.  DDx includes uncontrolled HTN with systolic BPs > 230 at times.   Acute hypoxic resp failure Likely in setting of  aspiration + HFpEF.   Hypokalemia Potassium3.1* (08/30 0327) Consider 10 meq tablets (coated) vs solution/powder for supp  EP will see as needed while here.  For questions or updates, please contact [facility] Please consult [email or web address] for contact info under Cardiology/STEMI.  Dustin Flock, PA-C  04/05/2023 10:45 AM  EP Attending  Patient seen and examined. Agree with above. The patient presented with AMS, which has resolved. We are asked to see for a 6 second pause associated with choking on a pill she was trying to swallow. On exam she is a pleasant elderly woman, NAD. Lungs were clear. CV with a RRR. Ext  with trace edema. Tele with NSR.  A/P Vagally mediated brady - agree with holding any AV or sinus nodal blocking drugs. Keep electrolytes replete. No indication for PM at this time. Hopefully she can avoid choking.   Sharlot Gowda Yannely Kintzel,MD

## 2023-04-05 NOTE — Plan of Care (Signed)
  Problem: Education: Goal: Knowledge of General Education information will improve Description: Including pain rating scale, medication(s)/side effects and non-pharmacologic comfort measures Outcome: Progressing   Problem: Health Behavior/Discharge Planning: Goal: Ability to manage health-related needs will improve Outcome: Progressing   Problem: Clinical Measurements: Goal: Ability to maintain clinical measurements within normal limits will improve Outcome: Progressing Goal: Will remain free from infection Outcome: Progressing Goal: Diagnostic test results will improve Outcome: Progressing Goal: Respiratory complications will improve Outcome: Progressing Goal: Cardiovascular complication will be avoided Outcome: Progressing   Problem: Nutrition: Goal: Adequate nutrition will be maintained Outcome: Progressing   Problem: Elimination: Goal: Will not experience complications related to bowel motility Outcome: Progressing Goal: Will not experience complications related to urinary retention Outcome: Progressing   Problem: Cardiac: Goal: Ability to achieve and maintain adequate cardiopulmonary perfusion will improve Outcome: Progressing

## 2023-04-05 NOTE — Progress Notes (Signed)
Occupational Therapy Treatment Patient Details Name: Laurie Fox MRN: [id number] DOB: Sep 08, 1931 Today's Date: 04/05/2023   History of present illness Pt is an 89 y/o female who was at her PCP's office 04/02/2023 when she had an episode of drooling and inability to speak. She was found to have elevated HTN and was brought to the ED. She then had an episode of syncope when getting a CT scan of the head and was found to have hypoxia requiring ~4L/min supplemental O2. MRI negative, CT negative. PMH significant for breast CA, DM, HTN, L foot fracture surgery 2006, R THR 01/12/2023.   OT comments  Pt making good progress with functional goals. Pt very alert today and oriented to place, time and situation. Very agreeable to OOB activity. O2 SATs remained 92-94% througout activity. OT will continue to follow acutely to maximize level of function and safety       If plan is discharge home, recommend the following:  A lot of help with bathing/dressing/bathroom;A little help with walking and/or transfers;Help with stairs or ramp for entrance   Equipment Recommendations  None recommended by OT    Recommendations for Other Services      Precautions / Restrictions Precautions Precautions: Fall Restrictions Weight Bearing Restrictions: No       Mobility Bed Mobility Overal bed mobility: Needs Assistance Bed Mobility: Supine to Sit     Supine to sit: Min assist, HOB elevated, Used rails          Transfers Overall transfer level: Needs assistance Equipment used: Rolling walker (2 wheels) Transfers: Sit to/from Stand, Bed to chair/wheelchair/BSC Sit to Stand: Min assist                 Balance Overall balance assessment: Needs assistance Sitting-balance support: Feet supported, No upper extremity supported Sitting balance-Leahy Scale: Fair     Standing balance support: During functional activity, Single extremity supported Standing balance-Leahy Scale: Poor                              ADL either performed or assessed with clinical judgement   ADL Overall ADL's : Needs assistance/impaired     Grooming: Wash/dry hands;Wash/dry face;Contact guard assist;Standing   Upper Body Bathing: Contact guard assist;Sitting Upper Body Bathing Details (indicate cue type and reason): simulated Lower Body Bathing: Moderate assistance;Sitting/lateral leans Lower Body Bathing Details (indicate cue type and reason): simulated Upper Body Dressing : Set up;Supervision/safety;Sitting       Toilet Transfer: Minimal assistance;Ambulation;Rolling walker (2 wheels);Cueing for safety   Toileting- Clothing Manipulation and Hygiene: Minimal assistance;Sit to/from stand       Functional mobility during ADLs: Minimal assistance;Rolling walker (2 wheels);Cueing for safety General ADL Comments: pt pleasant, agreeable to OOB activity    Extremity/Trunk Assessment Upper Extremity Assessment Upper Extremity Assessment: Generalized weakness   Lower Extremity Assessment Lower Extremity Assessment: Defer to PT evaluation        Vision Ability to See in Adequate Light: 0 Adequate Patient Visual Report: No change from baseline     Perception     Praxis      Cognition Arousal: Alert Behavior During Therapy: WFL for tasks assessed/performed Overall Cognitive Status: Within Functional Limits for tasks assessed                                 General Comments: pt very alert today and oriented  to place, time and situation. Very agreeable to OOB activity. O2 SATs remained 92-94% througout activity        Exercises      Shoulder Instructions       General Comments      Pertinent Vitals/ Pain       Pain Assessment Pain Assessment: No/denies pain Pain Score: 0-No pain Pain Intervention(s): Monitored during session  Home Living Family/patient expects to be discharged to:: Assisted living Living Arrangements: Alone                                       Prior Functioning/Environment              Frequency  Min 2X/week        Progress Toward Goals  OT Goals(current goals can now be found in the care plan section)  Progress towards OT goals: Progressing toward goals     Plan      Co-evaluation                 AM-PAC OT "6 Clicks" Daily Activity     Outcome Measure   Help from another person eating meals?: None Help from another person taking care of personal grooming?: A Little Help from another person toileting, which includes using toliet, bedpan, or urinal?: A Lot Help from another person bathing (including washing, rinsing, drying)?: A Lot Help from another person to put on and taking off regular upper body clothing?: A Little Help from another person to put on and taking off regular lower body clothing?: A Lot 6 Click Score: 16    End of Session Equipment Utilized During Treatment: Gait belt;Rolling walker (2 wheels)  OT Visit Diagnosis: Other abnormalities of gait and mobility (R26.89);Unsteadiness on feet (R26.81);Muscle weakness (generalized) (M62.81)   Activity Tolerance Patient tolerated treatment well   Patient Left in chair;with call bell/phone within reach;with chair alarm set   Nurse Communication          Time: 4098-1191 OT Time Calculation (min): 25 min  Charges: OT General Charges $OT Visit: 1 Visit OT Treatments $Self Care/Home Management : 8-22 mins $Therapeutic Activity: 8-22 mins    Galen Manila 04/05/2023, 12:29 PM

## 2023-04-05 NOTE — Progress Notes (Addendum)
PROGRESS NOTE        PATIENT DETAILS Name: Laurie Fox Age: 89 y.o. Sex: female Date of Birth: 1931/09/09 Admit Date: 04/02/2023 Admitting Physician Tonye Royalty, DO ZOX:[id number], Ronaldo Miyamoto, DO  Brief Summary: 89 year old with HTN, HLD, DM-2, chronic HFpEF-who apparently lives at ALF-was at PCPs office when she had an episode where she started drooling, and was unable to speak-she was found to have elevated hypertension-and brought to the ED where she had a episode of syncope when getting a CT scan of the head. She was also found to have hypoxia requiring around 4 L of oxygen. She was then admitted to the hospitalist service.   Significant events: 8/27>> admit to [facility] 8/30>> sinus pause of around 8 seconds following a choking event.  EP consulted.  Significant studies: 8/27>>CT head: No acute abnormalities 8/27>>CT angiogram chest: No PE-bibasilar atelectasis versus pneumonia. 8/28>> MRI brain: No acute CVA  Significant microbiology data: None  Procedures: None  Consults: Electrophysiology  Subjective: Had a choking event with presyncope this a.m.  Otherwise no major issues overnight.  Much more awake and alert compared to yesterday.  Objective: Vitals: Blood pressure (!) 195/46, pulse (!) 50, temperature 97.9 F (36.6 C), temperature source Oral, resp. rate (!) 24, height 5\' 2"  (1.575 m), weight 77.1 kg, SpO2 92%.   Exam: Gen Exam:Alert awake-not in any distress HEENT:atraumatic, normocephalic Chest: B/L clear to auscultation anteriorly CVS:S1S2 regular Abdomen:soft non tender, non distended Extremities:+ edema Neurology: Non focal Skin: no rash  Pertinent Labs/Radiology:    Latest Ref Rng & Units 04/03/2023    2:12 AM 04/02/2023    7:01 PM 01/15/2023    6:38 AM  CBC  WBC 4.0 - 10.5 K/uL 7.9  7.3  7.5   Hemoglobin 12.0 - 15.0 g/dL 8.8  8.6  8.1   Hematocrit 36.0 - 46.0 % 29.6  28.3  25.8   Platelets 150 - 400 K/uL 252  270  336      Lab Results  Component Value Date   NA 139 04/05/2023   K 3.1 (L) 04/05/2023   CL 102 04/05/2023   CO2 27 04/05/2023      Assessment/Plan: Syncope while in the emergency room Unclear etiology-but suspect related to uncontrolled hypertension. Recent echo on 8/16-EF preserved EEG negative MRI brain nonacute Continue telemetry monitoring  Sinus pause of around 8 seconds likely due to vagal episode after choking on a pill on 8/30 Evaluated by EP-supportive care-continue to hold rate limiting agents. SLP evaluation for choking episode-to ensure she is on the right diet to minimize further events in the future.   Drooling/possible right facial droop while at PCPs office Unclear exactly what her deficits were-apparently this was very brief-and occurred prior to her presenting to the ED could have been related to uncontrolled hypertension MRI brain negative for CVA-doubt further workup required.   Hypertensive crisis SBP in the 220s-230s systolic on initial presentation BP still fluctuating quite a bit Continue irbesartan-amlodipine-add Lasix/hydralazine Follow and optimize Avoid beta-blocker and other rate limiting agents due to sinus bradycardia   Sinus bradycardia Mostly nocturnal-asymptomatic Beta-blocker on hold Telemetry monitoring TSH-stable   Acute hypoxic respiratory failure secondary to presumed aspiration PNA and HFpEF exacerbation in the setting of uncontrolled hypertension Slowly improving Continue Augmentin Will give 1 dose of IV Lasix today She is sleeping most of the time-have encouraged her to  mobilize and use incentive spirometry On very minimal oxygen-either 1-2 L   CKD stage IIIa Close to baseline   HLD Statin   GERD PPI   DM-2 (A1c 6.7 on 6/6) SSI Resume oral hypoglycemics on discharge  Recent Labs    04/04/23 1629 04/04/23 2058 04/05/23 0825  GLUCAP 156* 169* 207*     Obesity: Estimated body mass index is 31.09 kg/m as calculated  from the following:   Height as of this encounter: 5\' 2"  (1.575 m).   Weight as of this encounter: 77.1 kg.   Code status:   Code Status: Full Code   DVT Prophylaxis: Place and maintain sequential compression device Start: 04/05/23 0907 SCDs Start: 04/03/23 0145   Family Communication: Son-Michael-[phone]-left voicemail on 8/29   Disposition Plan: Status is: Inpatient Remains inpatient appropriate because: Severity of illness   Planned Discharge Destination:Assisted living   Diet: Diet Order             Diet heart healthy/carb modified Room service appropriate? Yes; Fluid consistency: Thin  Diet effective now                     Antimicrobial agents: Anti-infectives (From admission, onward)    Start     Dose/Rate Route Frequency Ordered Stop   04/04/23 2200  amoxicillin-clavulanate (AUGMENTIN) 500-125 MG per tablet 1 tablet        1 tablet Oral 2 times daily 04/04/23 1417 04/07/23 0959   04/03/23 2200  cefTRIAXone (ROCEPHIN) 1 g in sodium chloride 0.9 % 100 mL IVPB  Status:  Discontinued        1 g 200 mL/hr over 30 Minutes Intravenous Every 24 hours 04/03/23 0145 04/03/23 1214   04/03/23 2000  amoxicillin-clavulanate (AUGMENTIN) 875-125 MG per tablet 1 tablet  Status:  Discontinued        1 tablet Oral Every 12 hours 04/03/23 1214 04/04/23 1417   04/03/23 1000  doxycycline (VIBRAMYCIN) 100 mg in sodium chloride 0.9 % 250 mL IVPB  Status:  Discontinued        100 mg 125 mL/hr over 120 Minutes Intravenous Every 12 hours 04/03/23 0145 04/03/23 1214   04/02/23 2045  cefTRIAXone (ROCEPHIN) 1 g in sodium chloride 0.9 % 100 mL IVPB        1 g 200 mL/hr over 30 Minutes Intravenous  Once 04/02/23 2034 04/02/23 2150   04/02/23 2045  doxycycline (VIBRAMYCIN) 100 mg in sodium chloride 0.9 % 250 mL IVPB        100 mg 125 mL/hr over 120 Minutes Intravenous  Once 04/02/23 2034 04/02/23 2350        MEDICATIONS: Scheduled Meds:  amLODipine  5 mg Oral Daily    amoxicillin-clavulanate  1 tablet Oral BID   furosemide  40 mg Intravenous Daily   hydrALAZINE  25 mg Oral Q8H   insulin aspart  0-15 Units Subcutaneous TID WC   insulin aspart  0-5 Units Subcutaneous QHS   irbesartan  150 mg Oral Daily   pantoprazole  40 mg Oral Daily   simvastatin  20 mg Oral Daily   sodium chloride flush  3 mL Intravenous Q12H   Continuous Infusions: PRN Meds:.acetaminophen **OR** acetaminophen, albuterol, bisacodyl, hydrALAZINE, HYDROcodone-acetaminophen, ipratropium, ondansetron **OR** ondansetron (ZOFRAN) IV, senna-docusate, traZODone   I have personally reviewed following labs and imaging studies  LABORATORY DATA: CBC: Recent Labs  Lab 04/02/23 1901 04/03/23 0212  WBC 7.3 7.9  NEUTROABS 6.0  --   HGB 8.6* 8.8*  HCT 28.3* 29.6*  MCV 74.7* 75.7*  PLT 270 252    Basic Metabolic Panel: Recent Labs  Lab 04/02/23 1901 04/03/23 0212 04/04/23 0316 04/05/23 0327  NA 139 139 138 139  K 4.4 4.0 3.8 3.1*  CL 102 102 102 102  CO2 27 25 27 27   GLUCOSE 238* 233* 178* 183*  BUN 33* 32* 32* 34*  CREATININE 1.20* 1.18* 1.25* 1.26*  CALCIUM 9.3 9.6 9.7 9.4    GFR: Estimated Creatinine Clearance: 29.7 mL/min (A) (by C-G formula based on SCr of 1.26 mg/dL (H)).  Liver Function Tests: Recent Labs  Lab 04/02/23 1901 04/03/23 0212  AST 14* 25  ALT 20 32  ALKPHOS 130* 142*  BILITOT 0.6 0.9  PROT 6.0* 5.7*  ALBUMIN 3.8 3.4*   No results for input(s): "LIPASE", "AMYLASE" in the last 168 hours. No results for input(s): "AMMONIA" in the last 168 hours.  Coagulation Profile: Recent Labs  Lab 04/02/23 1901  INR 1.1    Cardiac Enzymes: No results for input(s): "CKTOTAL", "CKMB", "CKMBINDEX", "TROPONINI" in the last 168 hours.  BNP (last 3 results) No results for input(s): "PROBNP" in the last 8760 hours.  Lipid Profile: Recent Labs    04/03/23 0212  CHOL 115  HDL 47  LDLCALC 56  TRIG 61  CHOLHDL 2.4    Thyroid Function Tests: Recent  Labs    04/03/23 0212  TSH 4.296    Anemia Panel: No results for input(s): "VITAMINB12", "FOLATE", "FERRITIN", "TIBC", "IRON", "RETICCTPCT" in the last 72 hours.  Urine analysis:    Component Value Date/Time   COLORURINE STRAW (A) 04/03/2023 0044   APPEARANCEUR CLEAR 04/03/2023 0044   LABSPEC 1.011 04/03/2023 0044   PHURINE 5.0 04/03/2023 0044   GLUCOSEU NEGATIVE 04/03/2023 0044   HGBUR NEGATIVE 04/03/2023 0044   BILIRUBINUR NEGATIVE 04/03/2023 0044   KETONESUR NEGATIVE 04/03/2023 0044   PROTEINUR NEGATIVE 04/03/2023 0044   NITRITE NEGATIVE 04/03/2023 0044   LEUKOCYTESUR NEGATIVE 04/03/2023 0044    Sepsis Labs: Lactic Acid, Venous No results found for: "LATICACIDVEN"  MICROBIOLOGY: No results found for this or any previous visit (from the past 240 hour(s)).  RADIOLOGY STUDIES/RESULTS: DG Chest Port 1V same Day  Result Date: 04/05/2023 CLINICAL DATA:  Shortness of breath EXAM: PORTABLE CHEST 1 VIEW COMPARISON:  03/19/2023 FINDINGS: Cardiomegaly. Unchanged layering bilateral pleural effusions. Unchanged mild diffuse interstitial opacity. No new airspace opacity. Severe bilateral glenohumeral arthrosis. IMPRESSION: Cardiomegaly with unchanged layering bilateral pleural effusions and mild diffuse interstitial opacity, consistent with edema. No new airspace opacity. Electronically Signed   By: Jearld Lesch M.D.   On: 04/05/2023 08:17   EEG adult  Result Date: 04/03/2023 Charlsie Quest, MD     04/04/2023 11:50 AM Patient Name: TAMIA RANA MRN: [id number] Epilepsy Attending: Charlsie Quest Referring Physician/Provider: Maretta Bees, MD Date: 04/03/2023 Duration: 26.08 mins Patient history: 89yo F with an episode of unresponsiveness getting eeg to evaluate for seizure. Level of alertness: Awake, asleep AEDs during EEG study: None Technical aspects: This EEG study was done with scalp electrodes positioned according to the 10-20 International system of electrode placement.  Electrical activity was reviewed with band pass filter of 1-70Hz , sensitivity of 7 uV/mm, display speed of 42mm/sec with a 60Hz  notched filter applied as appropriate. EEG data were recorded continuously and digitally stored.  Video monitoring was available and reviewed as appropriate. Description: The posterior dominant rhythm consists of 8 Hz activity of moderate voltage (25-35 uV) seen predominantly in posterior head  regions, symmetric and reactive to eye opening and eye closing. Sleep was characterized by sleep spindles (12 to 14 Hz), maximal frontocentral region. EEG showed intermittent generalized 3 to 6 Hz theta-delta slowing. Hyperventilation and photic stimulation were not performed.   ABNORMALITY - Intermittent slow, generalized IMPRESSION: This study is suggestive of mild diffuse encephalopathy, nonspecific etiology. No seizures or epileptiform discharges were seen throughout the recording. Priyanka Annabelle Harman     LOS: 3 days   Jeoffrey Massed, MD  [facility]    To contact the attending provider between 7A-7P or the covering provider during after hours 7P-7A, please log into the web site [email or web address] and access using universal [place] password for that web site. If you do not have the password, please call the hospital operator.  04/05/2023, 12:03 PM

## 2023-04-05 NOTE — Progress Notes (Signed)
Physical Therapy Treatment Patient Details Name: Laurie Fox MRN: [id number] DOB: 06-26-1932 Today's Date: 04/05/2023   History of Present Illness Pt is an 89 y/o female who was at her PCP's office 04/02/2023 when she had an episode of drooling and inability to speak. She was found to have elevated HTN and was brought to the ED. She then had an episode of syncope when getting a CT scan of the head and was found to have hypoxia requiring ~4L/min supplemental O2. MRI negative, CT negative. PMH significant for breast CA, DM, HTN, L foot fracture surgery 2006, R THR 01/12/2023.    PT Comments  Pt seen for PT tx with pt agreeable. Pt reports lightheadedness at rest, BP in LUE: 159/63 mmHg MAP 88. Pt is able to progress with mobility, requiring min assist for STS transfers, min assist for gait with RW.  Pt on 2L/min with SPO2 dropping as low as 87% after first gait attempt but pt able to recover to >/= 90% with rest; pt noted to have labored breathing during gait. Pt also incontinent of urine during mobility. Continue to recommend acute PT services to address strengthening, endurance, balance, & gait with LRAD.   If plan is discharge home, recommend the following: Assistance with cooking/housework;Direct supervision/assist for medications management;Assist for transportation;Help with stairs or ramp for entrance;Direct supervision/assist for financial management;Two people to help with bathing/dressing/bathroom;A little help with walking and/or transfers;A little help with bathing/dressing/bathroom;Supervision due to cognitive status   Can travel by private vehicle        Equipment Recommendations  None recommended by PT (pt reports she has a RW)    Recommendations for Other Services       Precautions / Restrictions Precautions Precautions: Fall Restrictions Weight Bearing Restrictions: No     Mobility  Bed Mobility               General bed mobility comments: not tested, pt received  & left sitting in recliner    Transfers Overall transfer level: Needs assistance Equipment used: Rolling walker (2 wheels) Transfers: Sit to/from Stand Sit to Stand: Min assist           General transfer comment: STS from recliner with education re: hand placement to push to standing, assistance with anterior weight shifting to prevent posterior LOB during transfer    Ambulation/Gait Ambulation/Gait assistance: Min assist Gait Distance (Feet): 40 Feet (+ 10 ft) Assistive device: Rolling walker (2 wheels) Gait Pattern/deviations: Decreased step length - right, Decreased step length - left, Decreased stride length, Decreased dorsiflexion - right, Decreased dorsiflexion - left Gait velocity: decreased     General Gait Details: no overt LOB   Stairs             Wheelchair Mobility     Tilt Bed    Modified Rankin (Stroke Patients Only)       Balance Overall balance assessment: Needs assistance Sitting-balance support: Feet supported, No upper extremity supported Sitting balance-Leahy Scale: Fair     Standing balance support: During functional activity, Bilateral upper extremity supported, Reliant on assistive device for balance Standing balance-Leahy Scale: Fair                              Cognition Arousal: Alert Behavior During Therapy: WFL for tasks assessed/performed Overall Cognitive Status: Within Functional Limits for tasks assessed  Following Commands: Follows one step commands with increased time, Follows one step commands consistently       General Comments: pleasant, agreeable to participate        Exercises      General Comments        Pertinent Vitals/Pain Pain Assessment Pain Assessment: No/denies pain    Home Living Family/patient expects to be discharged to:: Assisted living Living Arrangements: Alone                      Prior Function            PT Goals  (current goals can now be found in the care plan section) Acute Rehab PT Goals Patient Stated Goal: get better PT Goal Formulation: With patient Time For Goal Achievement: 04/17/23 Potential to Achieve Goals: Good Progress towards PT goals: Progressing toward goals    Frequency    Min 1X/week      PT Plan      Co-evaluation              AM-PAC PT "6 Clicks" Mobility   Outcome Measure  Help needed turning from your back to your side while in a flat bed without using bedrails?: None Help needed moving from lying on your back to sitting on the side of a flat bed without using bedrails?: A Little Help needed moving to and from a bed to a chair (including a wheelchair)?: A Little Help needed standing up from a chair using your arms (e.g., wheelchair or bedside chair)?: A Little Help needed to walk in hospital room?: A Little Help needed climbing 3-5 steps with a railing? : A Lot 6 Click Score: 18    End of Session Equipment Utilized During Treatment: Oxygen Activity Tolerance: Patient tolerated treatment well Patient left: in chair;with chair alarm set;with call bell/phone within reach Nurse Communication: Mobility status PT Visit Diagnosis: Unsteadiness on feet (R26.81);Difficulty in walking, not elsewhere classified (R26.2);Muscle weakness (generalized) (M62.81)     Time: 1434-1500 PT Time Calculation (min) (ACUTE ONLY): 26 min  Charges:    $Therapeutic Activity: 23-37 mins PT General Charges $$ ACUTE PT VISIT: 1 Visit                     Aleda Grana, PT, DPT 04/05/23, 3:07 PM   Sandi Mariscal 04/05/2023, 3:06 PM

## 2023-04-06 DIAGNOSIS — I10 Essential (primary) hypertension: Secondary | ICD-10-CM | POA: Diagnosis not present

## 2023-04-06 DIAGNOSIS — I509 Heart failure, unspecified: Secondary | ICD-10-CM | POA: Diagnosis not present

## 2023-04-06 DIAGNOSIS — J189 Pneumonia, unspecified organism: Secondary | ICD-10-CM | POA: Diagnosis not present

## 2023-04-06 DIAGNOSIS — R55 Syncope and collapse: Secondary | ICD-10-CM | POA: Diagnosis not present

## 2023-04-06 LAB — BASIC METABOLIC PANEL
Anion gap: 12 (ref 5–15)
BUN: 35 mg/dL — ABNORMAL HIGH (ref 8–23)
CO2: 29 mmol/L (ref 22–32)
Calcium: 9.5 mg/dL (ref 8.9–10.3)
Chloride: 101 mmol/L (ref 98–111)
Creatinine, Ser: 1.38 mg/dL — ABNORMAL HIGH (ref 0.44–1.00)
GFR, Estimated: 37 mL/min — ABNORMAL LOW (ref >60)
Glucose, Bld: 188 mg/dL — ABNORMAL HIGH (ref 70–99)
Potassium: 3.2 mmol/L — ABNORMAL LOW (ref 3.5–5.1)
Sodium: 142 mmol/L (ref 135–145)

## 2023-04-06 LAB — GLUCOSE, CAPILLARY
Glucose-Capillary: 213 mg/dL — ABNORMAL HIGH (ref 70–99)
Glucose-Capillary: 248 mg/dL — ABNORMAL HIGH (ref 70–99)
Glucose-Capillary: 238 mg/dL — ABNORMAL HIGH (ref 70–99)

## 2023-04-06 MED ORDER — ENSURE ENLIVE PO LIQD
237.0000 mL | Freq: Two times a day (BID) | ORAL | Status: DC
  Administered 2023-04-06 – 2023-04-09 (×8): 237 mL via ORAL

## 2023-04-06 MED ORDER — FUROSEMIDE 40 MG PO TABS
40.0000 mg | ORAL_TABLET | Freq: Once | ORAL | Status: AC
  Administered 2023-04-07: 40 mg via ORAL
  Filled 2023-04-06: qty 1

## 2023-04-06 MED ORDER — ISOSORBIDE MONONITRATE ER 30 MG PO TB24
15.0000 mg | ORAL_TABLET | Freq: Every day | ORAL | Status: DC
  Administered 2023-04-06: 15 mg via ORAL
  Filled 2023-04-06: qty 1

## 2023-04-06 MED ORDER — POTASSIUM CHLORIDE 20 MEQ PO PACK
40.0000 meq | PACK | ORAL | Status: AC
  Administered 2023-04-06 (×2): 40 meq via ORAL
  Filled 2023-04-06 (×2): qty 2

## 2023-04-06 MED ORDER — HYDRALAZINE HCL 50 MG PO TABS
50.0000 mg | ORAL_TABLET | Freq: Three times a day (TID) | ORAL | Status: DC
  Administered 2023-04-06 – 2023-04-07 (×3): 50 mg via ORAL
  Filled 2023-04-06 (×3): qty 1

## 2023-04-06 NOTE — Plan of Care (Signed)
  Problem: Clinical Measurements: Goal: Ability to maintain clinical measurements within normal limits will improve Outcome: Progressing   

## 2023-04-06 NOTE — Plan of Care (Signed)
Pt has rested quietly throughout the night with no distress noted. Placed on [facility] when sats dropped to mid 80's when sound asleep and breathing with her mouth open. Sats came straight back up to mid=high 90's. SB on the monitor. Medicated for pain and sleep with relief noted. No complaints voiced.     Problem: Education: Goal: Knowledge of General Education information will improve Description: Including pain rating scale, medication(s)/side effects and non-pharmacologic comfort measures Outcome: Progressing   Problem: Health Behavior/Discharge Planning: Goal: Ability to manage health-related needs will improve Outcome: Progressing   Problem: Clinical Measurements: Goal: Ability to maintain clinical measurements within normal limits will improve Outcome: Progressing Goal: Will remain free from infection Outcome: Progressing Goal: Diagnostic test results will improve Outcome: Progressing Goal: Respiratory complications will improve Outcome: Progressing Goal: Cardiovascular complication will be avoided Outcome: Progressing   Problem: Activity: Goal: Risk for activity intolerance will decrease Outcome: Progressing   Problem: Nutrition: Goal: Adequate nutrition will be maintained Outcome: Progressing   Problem: Coping: Goal: Level of anxiety will decrease Outcome: Progressing   Problem: Elimination: Goal: Will not experience complications related to bowel motility Outcome: Progressing Goal: Will not experience complications related to urinary retention Outcome: Progressing   Problem: Pain Managment: Goal: General experience of comfort will improve Outcome: Progressing   Problem: Safety: Goal: Ability to remain free from injury will improve Outcome: Progressing   Problem: Skin Integrity: Goal: Risk for impaired skin integrity will decrease Outcome: Progressing   Problem: Education: Goal: Ability to demonstrate management of disease process will improve Outcome:  Progressing Goal: Ability to verbalize understanding of medication therapies will improve Outcome: Progressing Goal: Individualized Educational Video(s) Outcome: Progressing   Problem: Activity: Goal: Capacity to carry out activities will improve Outcome: Progressing   Problem: Cardiac: Goal: Ability to achieve and maintain adequate cardiopulmonary perfusion will improve Outcome: Progressing

## 2023-04-06 NOTE — Progress Notes (Signed)
   04/06/23 0800  Vitals  BP (!) 197/78  MAP (mmHg) 112  BP Location Left Arm  BP Method Automatic  Patient Position (if appropriate) Sitting  Pulse Rate 64  ECG Heart Rate 64  Resp (!) 26  MEWS COLOR  MEWS Score Color Yellow  Oxygen Therapy  SpO2 (!) 84 %  O2 Device Room Air  MEWS Score  MEWS Temp 0  MEWS Systolic 0  MEWS Pulse 0  MEWS RR 2  MEWS LOC 0  MEWS Score 2   Sats 84% on Room air and sustained. Placed back on 2L

## 2023-04-06 NOTE — Progress Notes (Signed)
PROGRESS NOTE        PATIENT DETAILS Name: Laurie Fox Age: 89 y.o. Sex: female Date of Birth: 1932-04-23 Admit Date: 04/02/2023 Admitting Physician Tonye Royalty, DO [id number], Ronaldo Miyamoto, DO  Brief Summary: 89 year old with HTN, HLD, DM-2, chronic HFpEF-who apparently lives at ALF-was at PCPs office when she had an episode where she started drooling, and was unable to speak-she was found to have elevated hypertension-and brought to the ED where she had a episode of syncope when getting a CT scan of the head. She was also found to have hypoxia requiring around 4 L of oxygen. She was then admitted to the hospitalist service.   Significant events: 8/27>> admit to [facility] 8/30>> sinus pause of around 8 seconds following a choking event.  EP consulted-no PPM-likely vagal episode due to choking.  Significant studies: 8/27>>CT head: No acute abnormalities 8/27>>CT angiogram chest: No PE-bibasilar atelectasis versus pneumonia. 8/28>> MRI brain: No acute CVA  Significant microbiology data: None  Procedures: None  Consults: Electrophysiology  Subjective: Awake/alert.  No complaints.  Blood pressure still high.  Objective: Vitals: Blood pressure (!) 197/78, pulse 64, temperature 97.9 F (36.6 C), temperature source Oral, resp. rate (!) 26, height 5\' 2"  (1.575 m), weight 86.8 kg, SpO2 90%.   Exam: Gen Exam:Alert awake-not in any distress HEENT:atraumatic, normocephalic Chest: B/L clear to auscultation anteriorly CVS:S1S2 regular Abdomen:soft non tender, non distended Extremities:+ edema Neurology: Non focal Skin: no rash  Pertinent Labs/Radiology:    Latest Ref Rng & Units 04/03/2023    2:12 AM 04/02/2023    7:01 PM 01/15/2023    6:38 AM  CBC  WBC 4.0 - 10.5 K/uL 7.9  7.3  7.5   Hemoglobin 12.0 - 15.0 g/dL 8.8  8.6  8.1   Hematocrit 36.0 - 46.0 % 29.6  28.3  25.8   Platelets 150 - 400 K/uL 252  270  336     Lab Results  Component Value Date    NA 142 04/06/2023   K 3.2 (L) 04/06/2023   CL 101 04/06/2023   CO2 29 04/06/2023      Assessment/Plan: Syncope while in the emergency room Unclear etiology-but suspect related to uncontrolled hypertension. Recent echo on 8/16-EF preserved EEG negative MRI brain nonacute Continue telemetry monitoring  Sinus pause of around 8 seconds likely due to vagal episode after choking on a pill on 8/30 No PPM per EP Given bradycardia-all rate limiting agents on hold Telemetry monitoring.    Drooling/possible right facial droop while at PCPs office Unclear exactly what her deficits were-apparently this was very brief-and occurred prior to her presenting to the ED could have been related to uncontrolled hypertension MRI brain negative for CVA-doubt further workup required.   Hypertensive crisis BP continues to fluctuate Continue irbesartan Continue amlodipine Hydralazine dosage adjusted today Low-dose Imdur Follow/adjust   Sinus bradycardia Mostly nocturnal-asymptomatic Beta-blocker on hold Telemetry monitoring TSH-stable   Acute hypoxic respiratory failure secondary to presumed aspiration PNA and HFpEF exacerbation in the setting of uncontrolled hypertension Slowly improving Continue Augmentin Oral Lasix held today-will dose for tomorrow Significant amount of atelectasis-contributing to hypoxemia-incentive spirometry/mobilization encouraged. On minimal amount of oxygen-in fact titrated to room air this morning.   CKD stage IIIa mild elevation of creatinine today-likely due to diuretic regimen Hold Lasix today Repeat electrolytes tomorrow.   HLD Statin   GERD PPI  DM-2 (A1c 6.7 on 6/6) SSI Resume oral hypoglycemics on discharge  Recent Labs    04/05/23 1650 04/05/23 2111 04/06/23 0846  GLUCAP 300* 181* 213*     Obesity: Estimated body mass index is 35 kg/m as calculated from the following:   Height as of this encounter: 5\' 2"  (1.575 m).   Weight as of  this encounter: 86.8 kg.   Code status:   Code Status: Full Code   DVT Prophylaxis: Place and maintain sequential compression device Start: 04/05/23 0907 SCDs Start: 04/03/23 0145   Family Communication: Son-Michael-[phone]-left voicemail on 8/31   Disposition Plan: Status is: Inpatient Remains inpatient appropriate because: Severity of illness   Planned Discharge Destination:Assisted living   Diet: Diet Order             Diet heart healthy/carb modified Room service appropriate? Yes; Fluid consistency: Thin  Diet effective now                     Antimicrobial agents: Anti-infectives (From admission, onward)    Start     Dose/Rate Route Frequency Ordered Stop   04/04/23 2200  amoxicillin-clavulanate (AUGMENTIN) 500-125 MG per tablet 1 tablet        1 tablet Oral 2 times daily 04/04/23 1417 04/07/23 0959   04/03/23 2200  cefTRIAXone (ROCEPHIN) 1 g in sodium chloride 0.9 % 100 mL IVPB  Status:  Discontinued        1 g 200 mL/hr over 30 Minutes Intravenous Every 24 hours 04/03/23 0145 04/03/23 1214   04/03/23 2000  amoxicillin-clavulanate (AUGMENTIN) 875-125 MG per tablet 1 tablet  Status:  Discontinued        1 tablet Oral Every 12 hours 04/03/23 1214 04/04/23 1417   04/03/23 1000  doxycycline (VIBRAMYCIN) 100 mg in sodium chloride 0.9 % 250 mL IVPB  Status:  Discontinued        100 mg 125 mL/hr over 120 Minutes Intravenous Every 12 hours 04/03/23 0145 04/03/23 1214   04/02/23 2045  cefTRIAXone (ROCEPHIN) 1 g in sodium chloride 0.9 % 100 mL IVPB        1 g 200 mL/hr over 30 Minutes Intravenous  Once 04/02/23 2034 04/02/23 2150   04/02/23 2045  doxycycline (VIBRAMYCIN) 100 mg in sodium chloride 0.9 % 250 mL IVPB        100 mg 125 mL/hr over 120 Minutes Intravenous  Once 04/02/23 2034 04/02/23 2350        MEDICATIONS: Scheduled Meds:  amLODipine  5 mg Oral Daily   amoxicillin-clavulanate  1 tablet Oral BID   feeding supplement  237 mL Oral BID BM    [START ON 04/07/2023] furosemide  40 mg Oral Once   hydrALAZINE  50 mg Oral Q8H   insulin aspart  0-15 Units Subcutaneous TID WC   insulin aspart  0-5 Units Subcutaneous QHS   irbesartan  150 mg Oral Daily   isosorbide mononitrate  15 mg Oral Daily   pantoprazole  40 mg Oral Daily   potassium chloride  40 mEq Oral Q4H   simvastatin  20 mg Oral Daily   sodium chloride flush  3 mL Intravenous Q12H   Continuous Infusions: PRN Meds:.acetaminophen **OR** acetaminophen, albuterol, bisacodyl, hydrALAZINE, HYDROcodone-acetaminophen, ipratropium, ondansetron **OR** ondansetron (ZOFRAN) IV, senna-docusate, traZODone   I have personally reviewed following labs and imaging studies  LABORATORY DATA: CBC: Recent Labs  Lab 04/02/23 1901 04/03/23 0212  WBC 7.3 7.9  NEUTROABS 6.0  --   HGB 8.6*  8.8*  HCT 28.3* 29.6*  MCV 74.7* 75.7*  PLT 270 252    Basic Metabolic Panel: Recent Labs  Lab 04/02/23 1901 04/03/23 0212 04/04/23 0316 04/05/23 0327 04/06/23 0420  NA 139 139 138 139 142  K 4.4 4.0 3.8 3.1* 3.2*  CL 102 102 102 102 101  CO2 27 25 27 27 29   GLUCOSE 238* 233* 178* 183* 188*  BUN 33* 32* 32* 34* 35*  CREATININE 1.20* 1.18* 1.25* 1.26* 1.38*  CALCIUM 9.3 9.6 9.7 9.4 9.5    GFR: Estimated Creatinine Clearance: 28.8 mL/min (A) (by C-G formula based on SCr of 1.38 mg/dL (H)).  Liver Function Tests: Recent Labs  Lab 04/02/23 1901 04/03/23 0212  AST 14* 25  ALT 20 32  ALKPHOS 130* 142*  BILITOT 0.6 0.9  PROT 6.0* 5.7*  ALBUMIN 3.8 3.4*   No results for input(s): "LIPASE", "AMYLASE" in the last 168 hours. No results for input(s): "AMMONIA" in the last 168 hours.  Coagulation Profile: Recent Labs  Lab 04/02/23 1901  INR 1.1    Cardiac Enzymes: No results for input(s): "CKTOTAL", "CKMB", "CKMBINDEX", "TROPONINI" in the last 168 hours.  BNP (last 3 results) No results for input(s): "PROBNP" in the last 8760 hours.  Lipid Profile: No results for input(s):  "CHOL", "HDL", "LDLCALC", "TRIG", "CHOLHDL", "LDLDIRECT" in the last 72 hours.   Thyroid Function Tests: No results for input(s): "TSH", "T4TOTAL", "FREET4", "T3FREE", "THYROIDAB" in the last 72 hours.   Anemia Panel: No results for input(s): "VITAMINB12", "FOLATE", "FERRITIN", "TIBC", "IRON", "RETICCTPCT" in the last 72 hours.  Urine analysis:    Component Value Date/Time   COLORURINE STRAW (A) 04/03/2023 0044   APPEARANCEUR CLEAR 04/03/2023 0044   LABSPEC 1.011 04/03/2023 0044   PHURINE 5.0 04/03/2023 0044   GLUCOSEU NEGATIVE 04/03/2023 0044   HGBUR NEGATIVE 04/03/2023 0044   BILIRUBINUR NEGATIVE 04/03/2023 0044   KETONESUR NEGATIVE 04/03/2023 0044   PROTEINUR NEGATIVE 04/03/2023 0044   NITRITE NEGATIVE 04/03/2023 0044   LEUKOCYTESUR NEGATIVE 04/03/2023 0044    Sepsis Labs: Lactic Acid, Venous No results found for: "LATICACIDVEN"  MICROBIOLOGY: No results found for this or any previous visit (from the past 240 hour(s)).  RADIOLOGY STUDIES/RESULTS: DG Chest Port 1V same Day  Result Date: 04/05/2023 CLINICAL DATA:  Shortness of breath EXAM: PORTABLE CHEST 1 VIEW COMPARISON:  03/19/2023 FINDINGS: Cardiomegaly. Unchanged layering bilateral pleural effusions. Unchanged mild diffuse interstitial opacity. No new airspace opacity. Severe bilateral glenohumeral arthrosis. IMPRESSION: Cardiomegaly with unchanged layering bilateral pleural effusions and mild diffuse interstitial opacity, consistent with edema. No new airspace opacity. Electronically Signed   By: Jearld Lesch M.D.   On: 04/05/2023 08:17     LOS: 4 days   Jeoffrey Massed, MD  [facility]    To contact the attending provider between 7A-7P or the covering provider during after hours 7P-7A, please log into the web site [email or web address] and access using universal [place] password for that web site. If you do not have the password, please call the hospital operator.  04/06/2023, 10:49 AM

## 2023-04-06 NOTE — Plan of Care (Signed)
  Problem: Clinical Measurements: Goal: Ability to maintain clinical measurements within normal limits will improve 04/06/2023 1903 by Lezli Danek, Caren Griffins, RN Outcome: Progressing 04/06/2023 1903 by Cruzita Lipa, Caren Griffins, RN Outcome: Progressing

## 2023-04-07 DIAGNOSIS — R55 Syncope and collapse: Secondary | ICD-10-CM | POA: Diagnosis not present

## 2023-04-07 DIAGNOSIS — J189 Pneumonia, unspecified organism: Secondary | ICD-10-CM | POA: Diagnosis not present

## 2023-04-07 DIAGNOSIS — I509 Heart failure, unspecified: Secondary | ICD-10-CM | POA: Diagnosis not present

## 2023-04-07 DIAGNOSIS — I10 Essential (primary) hypertension: Secondary | ICD-10-CM | POA: Diagnosis not present

## 2023-04-07 LAB — GLUCOSE, CAPILLARY
Glucose-Capillary: 224 mg/dL — ABNORMAL HIGH (ref 70–99)
Glucose-Capillary: 224 mg/dL — ABNORMAL HIGH (ref 70–99)
Glucose-Capillary: 225 mg/dL — ABNORMAL HIGH (ref 70–99)
Glucose-Capillary: 168 mg/dL — ABNORMAL HIGH (ref 70–99)

## 2023-04-07 LAB — BASIC METABOLIC PANEL
Anion gap: 7 (ref 5–15)
BUN: 41 mg/dL — ABNORMAL HIGH (ref 8–23)
CO2: 31 mmol/L (ref 22–32)
Calcium: 9.2 mg/dL (ref 8.9–10.3)
Chloride: 101 mmol/L (ref 98–111)
Creatinine, Ser: 1.31 mg/dL — ABNORMAL HIGH (ref 0.44–1.00)
GFR, Estimated: 39 mL/min — ABNORMAL LOW (ref >60)
Glucose, Bld: 242 mg/dL — ABNORMAL HIGH (ref 70–99)
Potassium: 4.8 mmol/L (ref 3.5–5.1)
Sodium: 139 mmol/L (ref 135–145)

## 2023-04-07 LAB — MAGNESIUM: Magnesium: 1.8 mg/dL (ref 1.7–2.4)

## 2023-04-07 MED ORDER — ISOSORBIDE MONONITRATE ER 30 MG PO TB24
30.0000 mg | ORAL_TABLET | Freq: Every day | ORAL | Status: DC
  Administered 2023-04-07 – 2023-04-09 (×3): 30 mg via ORAL
  Filled 2023-04-07 (×3): qty 1

## 2023-04-07 MED ORDER — HYDRALAZINE HCL 50 MG PO TABS
75.0000 mg | ORAL_TABLET | Freq: Three times a day (TID) | ORAL | Status: DC
  Administered 2023-04-07 (×2): 75 mg via ORAL
  Filled 2023-04-07 (×3): qty 1

## 2023-04-07 MED ORDER — INSULIN GLARGINE-YFGN 100 UNIT/ML ~~LOC~~ SOLN
8.0000 [IU] | Freq: Every day | SUBCUTANEOUS | Status: DC
  Administered 2023-04-07 – 2023-04-09 (×3): 8 [IU] via SUBCUTANEOUS
  Filled 2023-04-07 (×3): qty 0.08

## 2023-04-07 MED ORDER — INSULIN ASPART 100 UNIT/ML IJ SOLN
0.0000 [IU] | Freq: Three times a day (TID) | INTRAMUSCULAR | Status: DC
  Administered 2023-04-07: 7 [IU] via SUBCUTANEOUS
  Administered 2023-04-07: 4 [IU] via SUBCUTANEOUS
  Administered 2023-04-07 – 2023-04-08 (×3): 7 [IU] via SUBCUTANEOUS
  Administered 2023-04-09: 4 [IU] via SUBCUTANEOUS
  Administered 2023-04-09 (×2): 7 [IU] via SUBCUTANEOUS

## 2023-04-07 NOTE — Plan of Care (Signed)
Pt has rested quietly throughout the night with no distress noted. Alert and oriented. No c/o SOA. Pt on 2LNC at night d/t mouth breathing and dropping sats. Takes meds with applesauce. No neuro deficits. Medicated for pain once with relief noted.     Problem: Education: Goal: Knowledge of General Education information will improve Description: Including pain rating scale, medication(s)/side effects and non-pharmacologic comfort measures Outcome: Progressing   Problem: Health Behavior/Discharge Planning: Goal: Ability to manage health-related needs will improve Outcome: Progressing   Problem: Clinical Measurements: Goal: Ability to maintain clinical measurements within normal limits will improve Outcome: Progressing Goal: Will remain free from infection Outcome: Progressing Goal: Diagnostic test results will improve Outcome: Progressing Goal: Respiratory complications will improve Outcome: Progressing Goal: Cardiovascular complication will be avoided Outcome: Progressing   Problem: Activity: Goal: Risk for activity intolerance will decrease Outcome: Progressing   Problem: Nutrition: Goal: Adequate nutrition will be maintained Outcome: Progressing   Problem: Coping: Goal: Level of anxiety will decrease Outcome: Progressing   Problem: Elimination: Goal: Will not experience complications related to bowel motility Outcome: Progressing Goal: Will not experience complications related to urinary retention Outcome: Progressing   Problem: Pain Managment: Goal: General experience of comfort will improve Outcome: Progressing   Problem: Safety: Goal: Ability to remain free from injury will improve Outcome: Progressing   Problem: Skin Integrity: Goal: Risk for impaired skin integrity will decrease Outcome: Progressing   Problem: Education: Goal: Ability to demonstrate management of disease process will improve Outcome: Progressing Goal: Ability to verbalize understanding  of medication therapies will improve Outcome: Progressing Goal: Individualized Educational Video(s) Outcome: Progressing   Problem: Activity: Goal: Capacity to carry out activities will improve Outcome: Progressing   Problem: Cardiac: Goal: Ability to achieve and maintain adequate cardiopulmonary perfusion will improve Outcome: Progressing

## 2023-04-07 NOTE — Plan of Care (Signed)
  Problem: Health Behavior/Discharge Planning: Goal: Ability to manage health-related needs will improve Outcome: Progressing   Problem: Clinical Measurements: Goal: Ability to maintain clinical measurements within normal limits will improve Outcome: Progressing   Problem: Nutrition: Goal: Adequate nutrition will be maintained Outcome: Progressing   Problem: Pain Managment: Goal: General experience of comfort will improve Outcome: Progressing   

## 2023-04-07 NOTE — Progress Notes (Signed)
PROGRESS NOTE        PATIENT DETAILS Name: Laurie Fox Age: 89 y.o. Sex: female Date of Birth: 21-May-1932 Admit Date: 04/02/2023 Admitting Physician Tonye Royalty, DO [id number], Ronaldo Miyamoto, DO  Brief Summary: 89 year old with HTN, HLD, DM-2, chronic HFpEF-who apparently lives at ALF-was at PCPs office when she had an episode where she started drooling, and was unable to speak-she was found to have elevated hypertension-and brought to the ED where she had a episode of syncope when getting a CT scan of the head. She was also found to have hypoxia requiring around 4 L of oxygen. She was then admitted to the hospitalist service.   Significant events: 8/27>> admit to [facility] 8/30>> sinus pause of around 8 seconds following a choking event.  EP consulted-no PPM-likely vagal episode due to choking.  Significant studies: 8/16>> echo: EF 55-60%. 8/27>>CT head: No acute abnormalities 8/27>>CT angiogram chest: No PE-bibasilar atelectasis versus pneumonia. 8/28>> MRI brain: No acute CVA  Significant microbiology data: None  Procedures: None  Consults: Electrophysiology  Subjective: Feels much better-still requiring around 1-2 L of oxygen.  Objective: Vitals: Blood pressure (!) 177/73, pulse 74, temperature 98.2 F (36.8 C), temperature source Oral, resp. rate 18, height 5\' 2"  (1.575 m), weight 83.6 kg, SpO2 99%.   Exam: Awake/alert Chest: Clear to auscultation CVS: S1-S2 regular Abdomen: Soft nontender Extremity + edema  Pertinent Labs/Radiology:    Latest Ref Rng & Units 04/03/2023    2:12 AM 04/02/2023    7:01 PM 01/15/2023    6:38 AM  CBC  WBC 4.0 - 10.5 K/uL 7.9  7.3  7.5   Hemoglobin 12.0 - 15.0 g/dL 8.8  8.6  8.1   Hematocrit 36.0 - 46.0 % 29.6  28.3  25.8   Platelets 150 - 400 K/uL 252  270  336     Lab Results  Component Value Date   NA 139 04/07/2023   K 4.8 04/07/2023   CL 101 04/07/2023   CO2 31 04/07/2023       Assessment/Plan: Syncope while in the emergency room Unclear etiology-but suspect related to uncontrolled hypertension. Recent echo on 8/16-EF preserved EEG negative MRI brain nonacute Continue telemetry monitoring  Sinus pause of around 8 seconds likely due to vagal episode after choking on a pill on 8/30 No PPM per EP Given bradycardia-all rate limiting agents on hold Telemetry monitoring-no further pauses.  Drooling/possible right facial droop while at PCPs office Unclear exactly what her deficits were-apparently this was very brief-and occurred prior to her presenting to the ED could have been related to uncontrolled hypertension MRI brain negative for CVA-doubt further workup required.   Hypertensive crisis BP continues to fluctuate-mostly on the higher side Continue irbesartan/Lasix/amlodipine Hydralazine/Imdur dosage increased today Follow/adjust   Sinus bradycardia Mostly nocturnal-asymptomatic Beta-blocker on hold-no plan to resume on discharge Telemetry monitoring TSH-stable   Acute hypoxic respiratory failure secondary to presumed aspiration PNA and HFpEF exacerbation in the setting of uncontrolled hypertension Slowly improving-but still requiring 1 to 2 L of oxygen to maintain O2 saturations Completed 5 days of antibiotics Volume status better-some mild edema but able to lie flat-on oral Lasix Continue pulmonary toileting/incentive spirometry/mobilization Continue to attempt to slowly titrate off oxygen but suspect may require short-term oxygen on discharge.   CKD stage IIIa Creatinine close to baseline   HLD Statin   GERD PPI  DM-2 (A1c 6.7 on 6/6) SSI Resume oral hypoglycemics on discharge  Recent Labs    04/06/23 1218 04/06/23 1701 04/07/23 0803  GLUCAP 248* 238* 224*     Obesity: Estimated body mass index is 33.71 kg/m as calculated from the following:   Height as of this encounter: 5\' 2"  (1.575 m).   Weight as of this encounter:  83.6 kg.   Code status:   Code Status: Full Code   DVT Prophylaxis: Place and maintain sequential compression device Start: 04/05/23 0907 SCDs Start: 04/03/23 0145   Family Communication: Son-Michael-[phone]-left voicemail on 8/31   Disposition Plan: Status is: Inpatient Remains inpatient appropriate because: Severity of illness   Planned Discharge Destination:Assisted living   Diet: Diet Order             Diet heart healthy/carb modified Room service appropriate? Yes; Fluid consistency: Thin  Diet effective now                     Antimicrobial agents: Anti-infectives (From admission, onward)    Start     Dose/Rate Route Frequency Ordered Stop   04/04/23 2200  amoxicillin-clavulanate (AUGMENTIN) 500-125 MG per tablet 1 tablet        1 tablet Oral 2 times daily 04/04/23 1417 04/06/23 2202   04/03/23 2200  cefTRIAXone (ROCEPHIN) 1 g in sodium chloride 0.9 % 100 mL IVPB  Status:  Discontinued        1 g 200 mL/hr over 30 Minutes Intravenous Every 24 hours 04/03/23 0145 04/03/23 1214   04/03/23 2000  amoxicillin-clavulanate (AUGMENTIN) 875-125 MG per tablet 1 tablet  Status:  Discontinued        1 tablet Oral Every 12 hours 04/03/23 1214 04/04/23 1417   04/03/23 1000  doxycycline (VIBRAMYCIN) 100 mg in sodium chloride 0.9 % 250 mL IVPB  Status:  Discontinued        100 mg 125 mL/hr over 120 Minutes Intravenous Every 12 hours 04/03/23 0145 04/03/23 1214   04/02/23 2045  cefTRIAXone (ROCEPHIN) 1 g in sodium chloride 0.9 % 100 mL IVPB        1 g 200 mL/hr over 30 Minutes Intravenous  Once 04/02/23 2034 04/02/23 2150   04/02/23 2045  doxycycline (VIBRAMYCIN) 100 mg in sodium chloride 0.9 % 250 mL IVPB        100 mg 125 mL/hr over 120 Minutes Intravenous  Once 04/02/23 2034 04/02/23 2350        MEDICATIONS: Scheduled Meds:  amLODipine  5 mg Oral Daily   feeding supplement  237 mL Oral BID BM   hydrALAZINE  75 mg Oral Q8H   insulin aspart  0-20 Units  Subcutaneous TID WC   insulin aspart  0-5 Units Subcutaneous QHS   insulin glargine-yfgn  8 Units Subcutaneous Daily   irbesartan  150 mg Oral Daily   isosorbide mononitrate  30 mg Oral Daily   pantoprazole  40 mg Oral Daily   simvastatin  20 mg Oral Daily   sodium chloride flush  3 mL Intravenous Q12H   Continuous Infusions: PRN Meds:.acetaminophen **OR** acetaminophen, albuterol, bisacodyl, hydrALAZINE, HYDROcodone-acetaminophen, ipratropium, ondansetron **OR** ondansetron (ZOFRAN) IV, senna-docusate, traZODone   I have personally reviewed following labs and imaging studies  LABORATORY DATA: CBC: Recent Labs  Lab 04/02/23 1901 04/03/23 0212  WBC 7.3 7.9  NEUTROABS 6.0  --   HGB 8.6* 8.8*  HCT 28.3* 29.6*  MCV 74.7* 75.7*  PLT 270 252    Basic Metabolic Panel:  Recent Labs  Lab 04/03/23 0212 04/04/23 0316 04/05/23 0327 04/06/23 0420 04/07/23 0344  NA 139 138 139 142 139  K 4.0 3.8 3.1* 3.2* 4.8  CL 102 102 102 101 101  CO2 25 27 27 29 31   GLUCOSE 233* 178* 183* 188* 242*  BUN 32* 32* 34* 35* 41*  CREATININE 1.18* 1.25* 1.26* 1.38* 1.31*  CALCIUM 9.6 9.7 9.4 9.5 9.2  MG  --   --   --   --  1.8    GFR: Estimated Creatinine Clearance: 29.8 mL/min (A) (by C-G formula based on SCr of 1.31 mg/dL (H)).  Liver Function Tests: Recent Labs  Lab 04/02/23 1901 04/03/23 0212  AST 14* 25  ALT 20 32  ALKPHOS 130* 142*  BILITOT 0.6 0.9  PROT 6.0* 5.7*  ALBUMIN 3.8 3.4*   No results for input(s): "LIPASE", "AMYLASE" in the last 168 hours. No results for input(s): "AMMONIA" in the last 168 hours.  Coagulation Profile: Recent Labs  Lab 04/02/23 1901  INR 1.1    Cardiac Enzymes: No results for input(s): "CKTOTAL", "CKMB", "CKMBINDEX", "TROPONINI" in the last 168 hours.  BNP (last 3 results) No results for input(s): "PROBNP" in the last 8760 hours.  Lipid Profile: No results for input(s): "CHOL", "HDL", "LDLCALC", "TRIG", "CHOLHDL", "LDLDIRECT" in the last  72 hours.   Thyroid Function Tests: No results for input(s): "TSH", "T4TOTAL", "FREET4", "T3FREE", "THYROIDAB" in the last 72 hours.   Anemia Panel: No results for input(s): "VITAMINB12", "FOLATE", "FERRITIN", "TIBC", "IRON", "RETICCTPCT" in the last 72 hours.  Urine analysis:    Component Value Date/Time   COLORURINE STRAW (A) 04/03/2023 0044   APPEARANCEUR CLEAR 04/03/2023 0044   LABSPEC 1.011 04/03/2023 0044   PHURINE 5.0 04/03/2023 0044   GLUCOSEU NEGATIVE 04/03/2023 0044   HGBUR NEGATIVE 04/03/2023 0044   BILIRUBINUR NEGATIVE 04/03/2023 0044   KETONESUR NEGATIVE 04/03/2023 0044   PROTEINUR NEGATIVE 04/03/2023 0044   NITRITE NEGATIVE 04/03/2023 0044   LEUKOCYTESUR NEGATIVE 04/03/2023 0044    Sepsis Labs: Lactic Acid, Venous No results found for: "LATICACIDVEN"  MICROBIOLOGY: No results found for this or any previous visit (from the past 240 hour(s)).  RADIOLOGY STUDIES/RESULTS: No results found.   LOS: 5 days   Jeoffrey Massed, MD  [facility]    To contact the attending provider between 7A-7P or the covering provider during after hours 7P-7A, please log into the web site [email or web address] and access using universal [facility] password for that web site. If you do not have the password, please call the hospital operator.  04/07/2023, 9:33 AM

## 2023-04-07 NOTE — Progress Notes (Signed)
No further pauses noted. EP to see as needed.  Loman Brooklyn, MD

## 2023-04-08 DIAGNOSIS — J189 Pneumonia, unspecified organism: Secondary | ICD-10-CM | POA: Diagnosis not present

## 2023-04-08 DIAGNOSIS — I509 Heart failure, unspecified: Secondary | ICD-10-CM | POA: Diagnosis not present

## 2023-04-08 DIAGNOSIS — R55 Syncope and collapse: Secondary | ICD-10-CM | POA: Diagnosis not present

## 2023-04-08 DIAGNOSIS — I10 Essential (primary) hypertension: Secondary | ICD-10-CM | POA: Diagnosis not present

## 2023-04-08 LAB — BASIC METABOLIC PANEL
Anion gap: 8 (ref 5–15)
BUN: 35 mg/dL — ABNORMAL HIGH (ref 8–23)
CO2: 28 mmol/L (ref 22–32)
Calcium: 9 mg/dL (ref 8.9–10.3)
Chloride: 101 mmol/L (ref 98–111)
Creatinine, Ser: 1.31 mg/dL — ABNORMAL HIGH (ref 0.44–1.00)
GFR, Estimated: 39 mL/min — ABNORMAL LOW (ref >60)
Glucose, Bld: 237 mg/dL — ABNORMAL HIGH (ref 70–99)
Potassium: 4.3 mmol/L (ref 3.5–5.1)
Sodium: 137 mmol/L (ref 135–145)

## 2023-04-08 LAB — GLUCOSE, CAPILLARY
Glucose-Capillary: 223 mg/dL — ABNORMAL HIGH (ref 70–99)
Glucose-Capillary: 249 mg/dL — ABNORMAL HIGH (ref 70–99)
Glucose-Capillary: 205 mg/dL — ABNORMAL HIGH (ref 70–99)
Glucose-Capillary: 131 mg/dL — ABNORMAL HIGH (ref 70–99)

## 2023-04-08 MED ORDER — HYDRALAZINE HCL 50 MG PO TABS
100.0000 mg | ORAL_TABLET | Freq: Three times a day (TID) | ORAL | Status: DC
  Administered 2023-04-08 – 2023-04-09 (×4): 100 mg via ORAL
  Filled 2023-04-08 (×4): qty 2

## 2023-04-08 MED ORDER — AMLODIPINE BESYLATE 10 MG PO TABS
10.0000 mg | ORAL_TABLET | Freq: Every day | ORAL | Status: DC
  Administered 2023-04-09: 10 mg via ORAL
  Filled 2023-04-08: qty 1

## 2023-04-08 MED ORDER — SENNOSIDES-DOCUSATE SODIUM 8.6-50 MG PO TABS
1.0000 | ORAL_TABLET | Freq: Every day | ORAL | Status: DC
  Filled 2023-04-08: qty 1

## 2023-04-08 MED ORDER — POLYETHYLENE GLYCOL 3350 17 G PO PACK
17.0000 g | PACK | Freq: Two times a day (BID) | ORAL | Status: DC
  Administered 2023-04-08 – 2023-04-09 (×2): 17 g via ORAL
  Filled 2023-04-08 (×3): qty 1

## 2023-04-08 MED ORDER — POLYETHYLENE GLYCOL 3350 17 G PO PACK
17.0000 g | PACK | Freq: Every day | ORAL | Status: DC
Start: 2023-04-08 — End: 2023-04-08

## 2023-04-08 NOTE — Progress Notes (Signed)
Pt sitting up in bed when machine auto collected vitals. Document respiration later from bedside monitor collect. BP remained elevated and hydralazine given. Will recollect in 1 hour and give scheduled am hydralazine po if remains elevated. Pt denies shortness of breath.   04/08/23 0400 04/08/23 0408  Assess: MEWS Score  Temp 98.4 F (36.9 C)  --   BP (!) 190/63  --   MAP (mmHg) 96  --   Pulse Rate 73 72  ECG Heart Rate 74 73  Resp (!) 26 20  SpO2 97 % 96 %  O2 Device Nasal Cannula  --   O2 Flow Rate (L/min) 2 L/min  --   Assess: MEWS Score  MEWS Temp 0 0  MEWS Systolic 0 0  MEWS Pulse 0 0  MEWS RR 2 0  MEWS LOC 0 0  MEWS Score 2 0  MEWS Score Color Yellow Green  Assess: if the MEWS score is Yellow or Red  Were vital signs accurate and taken at a resting state? No, vital signs rechecked (Pt had been sitting up in bed. Auto vitals taken)  --   MEWS guidelines implemented  Yes, yellow  --   Treat  MEWS Interventions Considered administering scheduled or prn medications/treatments as ordered  --   Take Vital Signs  Increase Vital Sign Frequency  Yellow: Q2hr x1, continue Q4hrs until patient remains green for 12hrs  --   Escalate  MEWS: Escalate Yellow: Discuss with charge nurse and consider notifying provider and/or RRT  --   Notify: Charge Nurse/RN  Name of Charge Nurse/RN Notified Nikki  --   Assess: SIRS CRITERIA  SIRS Temperature  0 0  SIRS Pulse 0 0  SIRS Respirations  1 0  SIRS WBC 0 0  SIRS Score Sum  1 0

## 2023-04-08 NOTE — Plan of Care (Signed)

## 2023-04-08 NOTE — Plan of Care (Signed)
Pt alert and oriented. Forgetful during overnight and pulling oxygen off. Pt flagged yellow mews due to sitting up this am bp elevated and slightly anxious. Gave hydralazine iv and effective. Pt resting and bp improved. Attending aware of bp this am and held 0600 dose.  Problem: Clinical Measurements: Goal: Cardiovascular complication will be avoided Outcome: Not Progressing   Problem: Education: Goal: Knowledge of General Education information will improve Description: Including pain rating scale, medication(s)/side effects and non-pharmacologic comfort measures Outcome: Progressing   Problem: Health Behavior/Discharge Planning: Goal: Ability to manage health-related needs will improve Outcome: Progressing   Problem: Clinical Measurements: Goal: Ability to maintain clinical measurements within normal limits will improve Outcome: Progressing Goal: Will remain free from infection Outcome: Progressing Goal: Diagnostic test results will improve Outcome: Progressing Goal: Respiratory complications will improve Outcome: Progressing   Problem: Activity: Goal: Risk for activity intolerance will decrease Outcome: Progressing   Problem: Nutrition: Goal: Adequate nutrition will be maintained Outcome: Progressing   Problem: Coping: Goal: Level of anxiety will decrease Outcome: Progressing   Problem: Elimination: Goal: Will not experience complications related to bowel motility Outcome: Progressing Goal: Will not experience complications related to urinary retention Outcome: Progressing   Problem: Pain Managment: Goal: General experience of comfort will improve Outcome: Progressing   Problem: Safety: Goal: Ability to remain free from injury will improve Outcome: Progressing   Problem: Skin Integrity: Goal: Risk for impaired skin integrity will decrease Outcome: Progressing   Problem: Education: Goal: Ability to demonstrate management of disease process will  improve Outcome: Progressing Goal: Ability to verbalize understanding of medication therapies will improve Outcome: Progressing Goal: Individualized Educational Video(s) Outcome: Progressing   Problem: Activity: Goal: Capacity to carry out activities will improve Outcome: Progressing   Problem: Cardiac: Goal: Ability to achieve and maintain adequate cardiopulmonary perfusion will improve Outcome: Progressing   Problem: Education: Goal: Ability to describe self-care measures that may prevent or decrease complications (Diabetes Survival Skills Education) will improve Outcome: Progressing Goal: Individualized Educational Video(s) Outcome: Progressing   Problem: Coping: Goal: Ability to adjust to condition or change in health will improve Outcome: Progressing   Problem: Fluid Volume: Goal: Ability to maintain a balanced intake and output will improve Outcome: Progressing   Problem: Health Behavior/Discharge Planning: Goal: Ability to identify and utilize available resources and services will improve Outcome: Progressing Goal: Ability to manage health-related needs will improve Outcome: Progressing   Problem: Metabolic: Goal: Ability to maintain appropriate glucose levels will improve Outcome: Progressing   Problem: Nutritional: Goal: Maintenance of adequate nutrition will improve Outcome: Progressing Goal: Progress toward achieving an optimal weight will improve Outcome: Progressing   Problem: Skin Integrity: Goal: Risk for impaired skin integrity will decrease Outcome: Progressing   Problem: Tissue Perfusion: Goal: Adequacy of tissue perfusion will improve Outcome: Progressing

## 2023-04-08 NOTE — TOC Progression Note (Signed)
Transition of Care [facility]) - Progression Note    Patient Details  Name: Laurie Fox MRN: [id number] Date of Birth: [birth year]  [facility] CM/SW Contact  Mearl Latin, LCSW Phone Number: 04/08/2023, 12:07 PM  Clinical Narrative:    CSW contacted [name] and spoke with care coordinator Cee Cee as RN Gerrit Heck is off today and will return tomorrow. CSW confirmed patient can receive home health therapy through [facility] and will fax ALF orders (f. [phone]). Patient's can use oxygen through [employer] or [employer]. Patient is on a regular diet there.    Expected Discharge Plan: Assisted Living Barriers to Discharge: Continued Medical Work up  Expected Discharge Plan and Services In-house Referral: Clinical Social Work   Post Acute Care Choice: Home Health Living arrangements for the past 2 months: Assisted Living Facility                                       Social Determinants of Health (SDOH) Interventions SDOH Screenings   Food Insecurity: No Food Insecurity (04/02/2023)  Housing: Low Risk  (04/02/2023)  Transportation Needs: No Transportation Needs (04/02/2023)  Utilities: Not At Risk (04/02/2023)  Tobacco Use: Unknown (01/12/2023)    Readmission Risk Interventions    01/11/2023    2:10 PM  Readmission Risk Prevention Plan  Transportation Screening Complete  PCP or Specialist Appt within 5-7 Days Complete  Home Care Screening Complete  Medication Review (RN CM) Complete

## 2023-04-08 NOTE — Progress Notes (Signed)
Oxygen Walk Test  Pulse oximetry on room air is 90%. Pulse oximetry on room air exertion is 87%. Pulse oximetry on 2L Nasal Cannula Oxygen is 95%.

## 2023-04-08 NOTE — Progress Notes (Signed)
PROGRESS NOTE        PATIENT DETAILS Name: Laurie Fox Age: 89 y.o. Sex: female Date of Birth: 10-Jan-1932 Admit Date: 04/02/2023 Admitting Physician Tonye Royalty, DO NGE:[id number], Ronaldo Miyamoto, DO  Brief Summary: 89 year old with HTN, HLD, DM-2, chronic HFpEF-who apparently lives at ALF-was at PCPs office when she had an episode where she started drooling, and was unable to speak-she was found to have elevated hypertension-and brought to the ED where she had a episode of syncope when getting a CT scan of the head. She was also found to have hypoxia requiring around 4 L of oxygen. She was then admitted to the hospitalist service.   Significant events: 8/27>> admit to [facility] 8/30>> sinus pause of around 8 seconds following a choking event.  EP consulted-no PPM-likely vagal episode due to choking.  Significant studies: 8/16>> echo: EF 55-60%. 8/27>>CT head: No acute abnormalities 8/27>>CT angiogram chest: No PE-bibasilar atelectasis versus pneumonia. 8/28>> MRI brain: No acute CVA  Significant microbiology data: None  Procedures: None  Consults: Electrophysiology  Subjective: No complaints-on 1 L of oxygen this morning.  Objective: Vitals: Blood pressure (!) 157/52, pulse 81, temperature 97.9 F (36.6 C), temperature source Axillary, resp. rate (!) 21, height 5\' 2"  (1.575 m), weight 84.3 kg, SpO2 94%.   Exam: Gen Exam:Alert awake-not in any distress HEENT:atraumatic, normocephalic Chest: B/L clear to auscultation anteriorly CVS:S1S2 regular Abdomen:soft non tender, non distended Extremities:+ edema Neurology: Non focal Skin: no rash  Pertinent Labs/Radiology:    Latest Ref Rng & Units 04/03/2023    2:12 AM 04/02/2023    7:01 PM 01/15/2023    6:38 AM  CBC  WBC 4.0 - 10.5 K/uL 7.9  7.3  7.5   Hemoglobin 12.0 - 15.0 g/dL 8.8  8.6  8.1   Hematocrit 36.0 - 46.0 % 29.6  28.3  25.8   Platelets 150 - 400 K/uL 252  270  336     Lab Results   Component Value Date   NA 137 04/08/2023   K 4.3 04/08/2023   CL 101 04/08/2023   CO2 28 04/08/2023      Assessment/Plan: Syncope while in the emergency room Unclear etiology-but suspect related to uncontrolled hypertension. Recent echo on 8/16-EF preserved EEG negative MRI brain nonacute Continue telemetry monitoring  Sinus pause of around 8 seconds likely due to vagal episode after choking on a pill on 8/30 No PPM per EP Given bradycardia-all rate limiting agents on hold Telemetry monitoring-no further pauses.  Drooling/possible right facial droop while at PCPs office Unclear exactly what her deficits were-apparently this was very brief-and occurred prior to her presenting to the ED could have been related to uncontrolled hypertension MRI brain negative for CVA-doubt further workup required.   Hypertensive crisis BP slowly stabilizing-but still on the higher side Continue irbesartan/Lasix/amlodipine/Imdur Increase hydralazine to 100 mg daily Follow/adjust.     Sinus bradycardia Mostly nocturnal-asymptomatic Beta-blocker on hold-no plan to resume on discharge Telemetry monitoring TSH-stable   Acute hypoxic respiratory failure secondary to presumed aspiration PNA and HFpEF exacerbation in the setting of uncontrolled hypertension Volume status much improved-some trace lower extreme edema Completed course of Augmentin Still on very minimal oxygen-1-2 L, per nursing staff-occasionally will desaturate on room air to the mid 80s while off oxygen  May require s short-term oxygen on discharge-check ambulatory O2 sat today.     CKD  stage IIIa Creatinine close to baseline   HLD Statin   GERD PPI   DM-2 (A1c 6.7 on 6/6) SSI Resume oral hypoglycemics on discharge  Recent Labs    04/07/23 1612 04/07/23 2218 04/08/23 0853  GLUCAP 225* 168* 223*     Obesity: Estimated body mass index is 33.99 kg/m as calculated from the following:   Height as of this encounter:  5\' 2"  (1.575 m).   Weight as of this encounter: 84.3 kg.   Code status:   Code Status: Full Code   DVT Prophylaxis: Place and maintain sequential compression device Start: 04/05/23 0907 SCDs Start: 04/03/23 0145   Family Communication: Son-[phone]-left voicemail on 8/31   Disposition Plan: Status is: Inpatient Remains inpatient appropriate because: Severity of illness   Planned Discharge Destination:Assisted living   Diet: Diet Order             Diet heart healthy/carb modified Room service appropriate? Yes; Fluid consistency: Thin  Diet effective now                     Antimicrobial agents: Anti-infectives (From admission, onward)    Start     Dose/Rate Route Frequency Ordered Stop   04/04/23 2200  amoxicillin-clavulanate (AUGMENTIN) 500-125 MG per tablet 1 tablet        1 tablet Oral 2 times daily 04/04/23 1417 04/06/23 2202   04/03/23 2200  cefTRIAXone (ROCEPHIN) 1 g in sodium chloride 0.9 % 100 mL IVPB  Status:  Discontinued        1 g 200 mL/hr over 30 Minutes Intravenous Every 24 hours 04/03/23 0145 04/03/23 1214   04/03/23 2000  amoxicillin-clavulanate (AUGMENTIN) 875-125 MG per tablet 1 tablet  Status:  Discontinued        1 tablet Oral Every 12 hours 04/03/23 1214 04/04/23 1417   04/03/23 1000  doxycycline (VIBRAMYCIN) 100 mg in sodium chloride 0.9 % 250 mL IVPB  Status:  Discontinued        100 mg 125 mL/hr over 120 Minutes Intravenous Every 12 hours 04/03/23 0145 04/03/23 1214   04/02/23 2045  cefTRIAXone (ROCEPHIN) 1 g in sodium chloride 0.9 % 100 mL IVPB        1 g 200 mL/hr over 30 Minutes Intravenous  Once 04/02/23 2034 04/02/23 2150   04/02/23 2045  doxycycline (VIBRAMYCIN) 100 mg in sodium chloride 0.9 % 250 mL IVPB        100 mg 125 mL/hr over 120 Minutes Intravenous  Once 04/02/23 2034 04/02/23 2350        MEDICATIONS: Scheduled Meds:  amLODipine  5 mg Oral Daily   feeding supplement  237 mL Oral BID BM   hydrALAZINE  100  mg Oral Q8H   insulin aspart  0-20 Units Subcutaneous TID WC   insulin aspart  0-5 Units Subcutaneous QHS   insulin glargine-yfgn  8 Units Subcutaneous Daily   irbesartan  150 mg Oral Daily   isosorbide mononitrate  30 mg Oral Daily   pantoprazole  40 mg Oral Daily   simvastatin  20 mg Oral Daily   sodium chloride flush  3 mL Intravenous Q12H   Continuous Infusions: PRN Meds:.acetaminophen **OR** acetaminophen, albuterol, bisacodyl, hydrALAZINE, HYDROcodone-acetaminophen, ipratropium, ondansetron **OR** ondansetron (ZOFRAN) IV, senna-docusate, traZODone   I have personally reviewed following labs and imaging studies  LABORATORY DATA: CBC: Recent Labs  Lab 04/02/23 1901 04/03/23 0212  WBC 7.3 7.9  NEUTROABS 6.0  --   HGB 8.6* 8.8*  HCT  28.3* 29.6*  MCV 74.7* 75.7*  PLT 270 252    Basic Metabolic Panel: Recent Labs  Lab 04/04/23 0316 04/05/23 0327 04/06/23 0420 04/07/23 0344 04/08/23 0215  NA 138 139 142 139 137  K 3.8 3.1* 3.2* 4.8 4.3  CL 102 102 101 101 101  CO2 27 27 29 31 28   GLUCOSE 178* 183* 188* 242* 237*  BUN 32* 34* 35* 41* 35*  CREATININE 1.25* 1.26* 1.38* 1.31* 1.31*  CALCIUM 9.7 9.4 9.5 9.2 9.0  MG  --   --   --  1.8  --     GFR: Estimated Creatinine Clearance: 29.9 mL/min (A) (by C-G formula based on SCr of 1.31 mg/dL (H)).  Liver Function Tests: Recent Labs  Lab 04/02/23 1901 04/03/23 0212  AST 14* 25  ALT 20 32  ALKPHOS 130* 142*  BILITOT 0.6 0.9  PROT 6.0* 5.7*  ALBUMIN 3.8 3.4*   No results for input(s): "LIPASE", "AMYLASE" in the last 168 hours. No results for input(s): "AMMONIA" in the last 168 hours.  Coagulation Profile: Recent Labs  Lab 04/02/23 1901  INR 1.1    Cardiac Enzymes: No results for input(s): "CKTOTAL", "CKMB", "CKMBINDEX", "TROPONINI" in the last 168 hours.  BNP (last 3 results) No results for input(s): "PROBNP" in the last 8760 hours.  Lipid Profile: No results for input(s): "CHOL", "HDL", "LDLCALC",  "TRIG", "CHOLHDL", "LDLDIRECT" in the last 72 hours.   Thyroid Function Tests: No results for input(s): "TSH", "T4TOTAL", "FREET4", "T3FREE", "THYROIDAB" in the last 72 hours.   Anemia Panel: No results for input(s): "VITAMINB12", "FOLATE", "FERRITIN", "TIBC", "IRON", "RETICCTPCT" in the last 72 hours.  Urine analysis:    Component Value Date/Time   COLORURINE STRAW (A) 04/03/2023 0044   APPEARANCEUR CLEAR 04/03/2023 0044   LABSPEC 1.011 04/03/2023 0044   PHURINE 5.0 04/03/2023 0044   GLUCOSEU NEGATIVE 04/03/2023 0044   HGBUR NEGATIVE 04/03/2023 0044   BILIRUBINUR NEGATIVE 04/03/2023 0044   KETONESUR NEGATIVE 04/03/2023 0044   PROTEINUR NEGATIVE 04/03/2023 0044   NITRITE NEGATIVE 04/03/2023 0044   LEUKOCYTESUR NEGATIVE 04/03/2023 0044    Sepsis Labs: Lactic Acid, Venous No results found for: "LATICACIDVEN"  MICROBIOLOGY: No results found for this or any previous visit (from the past 240 hour(s)).  RADIOLOGY STUDIES/RESULTS: No results found.   LOS: 6 days   Jeoffrey Massed, MD  [facility]    To contact the attending provider between 7A-7P or the covering provider during after hours 7P-7A, please log into the web site [email or web address] and access using universal [facility] password for that web site. If you do not have the password, please call the hospital operator.  04/08/2023, 9:40 AM

## 2023-04-09 DIAGNOSIS — R55 Syncope and collapse: Secondary | ICD-10-CM | POA: Diagnosis not present

## 2023-04-09 DIAGNOSIS — I509 Heart failure, unspecified: Secondary | ICD-10-CM | POA: Diagnosis not present

## 2023-04-09 DIAGNOSIS — J189 Pneumonia, unspecified organism: Secondary | ICD-10-CM | POA: Diagnosis not present

## 2023-04-09 DIAGNOSIS — I10 Essential (primary) hypertension: Secondary | ICD-10-CM | POA: Diagnosis not present

## 2023-04-09 LAB — GLUCOSE, CAPILLARY
Glucose-Capillary: 162 mg/dL — ABNORMAL HIGH (ref 70–99)
Glucose-Capillary: 241 mg/dL — ABNORMAL HIGH (ref 70–99)
Glucose-Capillary: 210 mg/dL — ABNORMAL HIGH (ref 70–99)

## 2023-04-09 MED ORDER — IRBESARTAN 150 MG PO TABS: 150.0000 mg | ORAL_TABLET | Freq: Every day | ORAL | 0 refills | Status: AC

## 2023-04-09 MED ORDER — FUROSEMIDE 40 MG PO TABS: 40.0000 mg | ORAL_TABLET | Freq: Every day | ORAL | 0 refills | Status: DC | PRN

## 2023-04-09 MED ORDER — HYDRALAZINE HCL 100 MG PO TABS: 100.0000 mg | ORAL_TABLET | Freq: Three times a day (TID) | ORAL | 0 refills | Status: AC

## 2023-04-09 MED ORDER — AMLODIPINE BESYLATE 10 MG PO TABS: 10.0000 mg | ORAL_TABLET | Freq: Every day | ORAL | 0 refills | Status: DC

## 2023-04-09 MED ORDER — ISOSORBIDE MONONITRATE ER 30 MG PO TB24: 30.0000 mg | ORAL_TABLET | Freq: Every day | ORAL | 0 refills | Status: AC

## 2023-04-09 NOTE — Progress Notes (Signed)
Physical Therapy Treatment Patient Details Name: Laurie Fox MRN: [id number] DOB: October 06, 1931 Today's Date: 04/09/2023   History of Present Illness Pt is an 89 y/o female who was at her PCP's office 04/02/2023 when she had an episode of drooling and inability to speak. She was found to have elevated HTN and was brought to the ED. She then had an episode of syncope when getting a CT scan of the head and was found to have hypoxia requiring ~4L/min supplemental O2. MRI negative, CT negative. PMH significant for breast CA, DM, HTN, L foot fracture surgery 2006, R THR 01/12/2023.    PT Comments  Pt received in supine, agreeable to therapy session and with good participation and tolerance for transfer and gait training with RW and assessment of supplemental O2 needs. Pt hypoxic on RA with minimal exertion (step pivot transfer from bed to chair). Pt needing up to minA for sit<>stand from EOB and CGA for gait short household distance with RW support. Pt needing 2-3L O2 [place] to maintain SpO2 WFL. Pt performed standing BLE AROM well, pulling 100-234mL on IS with teachback, encouraged hourly use of IS. Pt continues to benefit from PT services to progress toward functional mobility goals.    If plan is discharge home, recommend the following: Assistance with cooking/housework;Direct supervision/assist for medications management;Assist for transportation;Help with stairs or ramp for entrance;Direct supervision/assist for financial management;Two people to help with bathing/dressing/bathroom;A little help with walking and/or transfers;A little help with bathing/dressing/bathroom;Supervision due to cognitive status   Can travel by private vehicle        Equipment Recommendations  None recommended by PT (pt states she has a RW)    Recommendations for Other Services       Precautions / Restrictions Precautions Precautions: Fall Precaution Comments: O2 goal 92% and greater Restrictions Weight Bearing  Restrictions: No     Mobility  Bed Mobility Overal bed mobility: Needs Assistance Bed Mobility: Supine to Sit     Supine to sit: Min assist, Used rails, HOB elevated     General bed mobility comments: pt initiating BLE movements well, needing minA to advance hips with improved technique and CGA to raise trunk using bed features    Transfers Overall transfer level: Needs assistance Equipment used: Rolling walker (2 wheels) Transfers: Sit to/from Stand, Bed to chair/wheelchair/BSC Sit to Stand: Min assist   Step pivot transfers: Contact guard assist       General transfer comment: Cues for use of momentum strategy to fully rise, attempted from EOB with CGA but pt not able to stand fully so needed minA lift assist; pt moving RW well for step pivot with assist for lines and CGA. on RA for amb sats test during pivotal transfer and SpO2 desat to 84% on RA while sitting post-transfer, needing 2L O2 [place] to recover.    Ambulation/Gait Ambulation/Gait assistance: Contact guard assist Gait Distance (Feet): 32 Feet Assistive device: Rolling walker (2 wheels) Gait Pattern/deviations: Decreased step length - right, Decreased step length - left, Decreased stride length, Decreased dorsiflexion - right, Decreased dorsiflexion - left, Trunk flexed       General Gait Details: mod cues for upright posture and pursed-lip breathing, SpO2 90-93% on 2L O2 [place]. Pt reports 6/10 modified RPE once returned to chair.   Stairs             Wheelchair Mobility     Tilt Bed    Modified Rankin (Stroke Patients Only)       Balance Overall  balance assessment: Mild deficits observed, not formally tested     Sitting balance - Comments: EOB single UE support no LOB       Standing balance comment: reliant on RW                            Cognition Arousal: Alert Behavior During Therapy: WFL for tasks assessed/performed Overall Cognitive Status: Within Functional Limits for  tasks assessed                         Following Commands: Follows one step commands with increased time, Follows one step commands consistently       General Comments: Pleasant, agreeable to participate        Exercises Other Exercises Other Exercises: standing BLE AROM: mini squats x5 reps, hip flexion x10 reps in RW    General Comments General comments (skin integrity, edema, etc.): SpO2 89-91% on RA sitting EOB, desat to 84% on RA with exertion. SpO2 90% and greater on 2L O2 [place] with exertion, 97% on 2L O2 [place] at rest. [place] in place      Pertinent Vitals/Pain Pain Assessment Pain Assessment: No/denies pain Pain Intervention(s): Monitored during session, Repositioned    Home Living                          Prior Function            PT Goals (current goals can now be found in the care plan section) Acute Rehab PT Goals Patient Stated Goal: get better PT Goal Formulation: With patient Time For Goal Achievement: 04/17/23 Progress towards PT goals: Progressing toward goals    Frequency    Min 1X/week      PT Plan         AM-PAC PT "6 Clicks" Mobility   Outcome Measure  Help needed turning from your back to your side while in a flat bed without using bedrails?: None Help needed moving from lying on your back to sitting on the side of a flat bed without using bedrails?: A Little Help needed moving to and from a bed to a chair (including a wheelchair)?: A Little Help needed standing up from a chair using your arms (e.g., wheelchair or bedside chair)?: A Little Help needed to walk in hospital room?: A Little Help needed climbing 3-5 steps with a railing? : Total 6 Click Score: 17    End of Session Equipment Utilized During Treatment: Gait belt;Oxygen Activity Tolerance: Patient tolerated treatment well Patient left: in chair;with call bell/phone within reach;with chair alarm set Nurse Communication: Mobility status;Other (comment)  (needs 2-3L with ambulation) PT Visit Diagnosis: Unsteadiness on feet (R26.81);Difficulty in walking, not elsewhere classified (R26.2);Muscle weakness (generalized) (M62.81)     Time: 0454-0981 PT Time Calculation (min) (ACUTE ONLY): 30 min  Charges:    $Gait Training: 8-22 mins $Therapeutic Activity: 8-22 mins PT General Charges $$ ACUTE PT VISIT: 1 Visit                     Littie Chiem P., PTA Acute Rehabilitation Services Secure Chat Preferred 9a-5:30pm Office: [phone]    Dorathy Kinsman [facility] 04/09/2023, 12:17 PM

## 2023-04-09 NOTE — Progress Notes (Signed)
Tried calling transfer report to [facility] twice in the last 2 hours. Was transferred by secretary both times to the receiving nurse, with no answer. Voicemail was left with callback number.

## 2023-04-09 NOTE — Plan of Care (Signed)
  Problem: Education: Goal: Knowledge of General Education information will improve Description: Including pain rating scale, medication(s)/side effects and non-pharmacologic comfort measures Outcome: Completed/Met   Problem: Health Behavior/Discharge Planning: Goal: Ability to manage health-related needs will improve Outcome: Completed/Met   Problem: Clinical Measurements: Goal: Ability to maintain clinical measurements within normal limits will improve Outcome: Completed/Met Goal: Will remain free from infection Outcome: Completed/Met Goal: Diagnostic test results will improve Outcome: Completed/Met Goal: Respiratory complications will improve Outcome: Completed/Met Goal: Cardiovascular complication will be avoided Outcome: Completed/Met   Problem: Activity: Goal: Risk for activity intolerance will decrease Outcome: Completed/Met   Problem: Nutrition: Goal: Adequate nutrition will be maintained Outcome: Completed/Met   Problem: Coping: Goal: Level of anxiety will decrease Outcome: Completed/Met   Problem: Elimination: Goal: Will not experience complications related to bowel motility Outcome: Completed/Met Goal: Will not experience complications related to urinary retention Outcome: Completed/Met   Problem: Pain Managment: Goal: General experience of comfort will improve Outcome: Completed/Met   Problem: Safety: Goal: Ability to remain free from injury will improve Outcome: Completed/Met   Problem: Skin Integrity: Goal: Risk for impaired skin integrity will decrease Outcome: Completed/Met   Problem: Education: Goal: Ability to demonstrate management of disease process will improve Outcome: Completed/Met Goal: Ability to verbalize understanding of medication therapies will improve Outcome: Completed/Met Goal: Individualized Educational Video(s) Outcome: Completed/Met   Problem: Activity: Goal: Capacity to carry out activities will improve Outcome:  Completed/Met   Problem: Cardiac: Goal: Ability to achieve and maintain adequate cardiopulmonary perfusion will improve Outcome: Completed/Met   Problem: Education: Goal: Ability to describe self-care measures that may prevent or decrease complications (Diabetes Survival Skills Education) will improve Outcome: Completed/Met Goal: Individualized Educational Video(s) Outcome: Completed/Met   Problem: Coping: Goal: Ability to adjust to condition or change in health will improve Outcome: Completed/Met   Problem: Fluid Volume: Goal: Ability to maintain a balanced intake and output will improve Outcome: Completed/Met   Problem: Health Behavior/Discharge Planning: Goal: Ability to identify and utilize available resources and services will improve Outcome: Completed/Met Goal: Ability to manage health-related needs will improve Outcome: Completed/Met   Problem: Metabolic: Goal: Ability to maintain appropriate glucose levels will improve Outcome: Completed/Met   Problem: Nutritional: Goal: Maintenance of adequate nutrition will improve Outcome: Completed/Met Goal: Progress toward achieving an optimal weight will improve Outcome: Completed/Met   Problem: Skin Integrity: Goal: Risk for impaired skin integrity will decrease Outcome: Completed/Met   Problem: Tissue Perfusion: Goal: Adequacy of tissue perfusion will improve Outcome: Completed/Met

## 2023-04-09 NOTE — Progress Notes (Signed)
SATURATION QUALIFICATIONS: (This note is used to comply with regulatory documentation for home oxygen)  Patient Saturations on Room Air at Rest = 89%  Patient Saturations on Room Air while Ambulating = 84%  Patient Saturations on 2 Liters of oxygen while Ambulating = 90%  Please briefly explain why patient needs home oxygen: Pt hypoxic on RA with exertional tasks, will need up to 3L/min O2 to maintain SpO2 >92% goal.

## 2023-04-09 NOTE — Progress Notes (Signed)
Occupational Therapy Treatment Patient Details Name: Laurie Fox MRN: [id number] DOB: Jan 06, 1932 Today's Date: 04/09/2023   History of present illness Pt is an 89 y/o female who was at her PCP's office 04/02/2023 when she had an episode of drooling and inability to speak. She was found to have elevated HTN and was brought to the ED. She then had an episode of syncope when getting a CT scan of the head and was found to have hypoxia requiring ~4L/min supplemental O2. MRI negative, CT negative. PMH significant for breast CA, DM, HTN, L foot fracture surgery 2006, R THR 01/12/2023.   OT comments  Pt making slow and steady improvements with all adls as noted below. Pt continues to require the use of O2 during adls (see recent PT note dated today). On RA pt falls to 84% when taxed with adls and ambulation. Pt returns to 94% on 2L O2. Educated pt on pursed lip breathing as well as some energy conservation techniques to address O2 management.  Pt to return to AL facility and feel pt would benefit from [facility] at AL.  Will continue to follow for documented goals.      If plan is discharge home, recommend the following:  A little help with bathing/dressing/bathroom;Assistance with cooking/housework;Direct supervision/assist for medications management;Assist for transportation;Help with stairs or ramp for entrance   Equipment Recommendations  None recommended by OT    Recommendations for Other Services      Precautions / Restrictions Precautions Precautions: Fall Precaution Comments: O2 goal 92% and greater Restrictions Weight Bearing Restrictions: No       Mobility Bed Mobility               General bed mobility comments: pt in chair    Transfers Overall transfer level: Needs assistance Equipment used: Rolling walker (2 wheels) Transfers: Sit to/from Stand Sit to Stand: Min assist           General transfer comment: Cues for use of momentum strategy to fully rise, attempted from  EOB with CGA but pt not able to stand fully so needed minA lift assist; pt moving RW well for step pivot with assist for lines and CGA. on RA for amb sats test during pivotal transfer and SpO2 desat to 84% on RA while sitting post-transfer, needing 2L O2 [place] to recover.     Balance Overall balance assessment: Mild deficits observed, not formally tested Sitting-balance support: Feet supported Sitting balance-Leahy Scale: Normal     Standing balance support: During functional activity, Bilateral upper extremity supported, Reliant on assistive device for balance Standing balance-Leahy Scale: Fair Standing balance comment: reliant on RW                           ADL either performed or assessed with clinical judgement   ADL Overall ADL's : Needs assistance/impaired Eating/Feeding: Set up;Sitting   Grooming: Wash/dry hands;Wash/dry face;Oral care;Contact guard assist;Standing Grooming Details (indicate cue type and reason): fatigues quickly with sats dropping under 90% on RA.  Pt with sats above 92% on 2L O2 Upper Body Bathing: Set up;Sitting   Lower Body Bathing: Minimal assistance;Sit to/from stand;Cueing for compensatory techniques Lower Body Bathing Details (indicate cue type and reason): min assist in standing due to balance.  Reviewed body mechanics that will promote better breathing. Upper Body Dressing : Set up;Supervision/safety;Sitting   Lower Body Dressing: Moderate assistance;Sit to/from stand;Cueing for compensatory techniques Lower Body Dressing Details (indicate cue type and reason):  mod assist with socks and shoes. Rec pt prop leg on small stool or cross legs if possible to donn socks and shoes. Toilet Transfer: Minimal assistance;Ambulation;Rolling walker (2 wheels);Cueing for safety   Toileting- Clothing Manipulation and Hygiene: Minimal assistance;Sit to/from stand       Functional mobility during ADLs: Contact guard assist;Rolling walker (2 wheels) General  ADL Comments: walked to sink and back. Pt fatigues quickly but is requiring less assist than day of eval    Extremity/Trunk Assessment Upper Extremity Assessment Upper Extremity Assessment: Overall WFL for tasks assessed   Lower Extremity Assessment Lower Extremity Assessment: Defer to PT evaluation        Vision   Vision Assessment?: No apparent visual deficits   Perception Perception Perception: Within Functional Limits   Praxis Praxis Praxis: WFL    Cognition Arousal: Alert Behavior During Therapy: WFL for tasks assessed/performed Overall Cognitive Status: Within Functional Limits for tasks assessed                                 General Comments: Pt pleasant and motivated.  Demonstrated STM deficts in regards to her ALF but otherwise much more clear than evaluation date.        Exercises      Shoulder Instructions       General Comments Pt making slow but steady improvments.  Pt educated on pursed lip breathing and energy conservation techinques.  Eduated to bring legs up to her to dress and bathe or onto a stool if one is available instead of reaching to the floor.    Pertinent Vitals/ Pain       Pain Assessment Pain Assessment: No/denies pain Pain Score: 0-No pain  Home Living                                          Prior Functioning/Environment              Frequency  Min 1X/week        Progress Toward Goals  OT Goals(current goals can now be found in the care plan section)  Progress towards OT goals: Progressing toward goals  Acute Rehab OT Goals Patient Stated Goal: to get back to my facility OT Goal Formulation: With patient Time For Goal Achievement: 04/17/23 Potential to Achieve Goals: Good ADL Goals Pt Will Perform Grooming: with contact guard assist;with supervision;with set-up;standing Pt Will Perform Upper Body Bathing: with supervision Pt Will Perform Lower Body Bathing: with min assist;with  contact guard assist Pt Will Perform Upper Body Dressing: with supervision Pt Will Perform Lower Body Dressing: with min assist;with contact guard assist Pt Will Transfer to Toilet: with min assist;with contact guard assist;ambulating Pt Will Perform Toileting - Clothing Manipulation and hygiene: with mod assist;with min assist;sit to/from stand  Plan      Co-evaluation                 AM-PAC OT "6 Clicks" Daily Activity     Outcome Measure   Help from another person eating meals?: A Little Help from another person taking care of personal grooming?: A Little Help from another person toileting, which includes using toliet, bedpan, or urinal?: A Little Help from another person bathing (including washing, rinsing, drying)?: A Little Help from another person to put on and taking off regular upper  body clothing?: A Little Help from another person to put on and taking off regular lower body clothing?: A Lot 6 Click Score: 17    End of Session Equipment Utilized During Treatment: Rolling walker (2 wheels);Oxygen  OT Visit Diagnosis: Other abnormalities of gait and mobility (R26.89);Unsteadiness on feet (R26.81);Muscle weakness (generalized) (M62.81)   Activity Tolerance Patient tolerated treatment well   Patient Left in chair;with call bell/phone within reach;with chair alarm set   Nurse Communication Mobility status        Time: 6606-3016 OT Time Calculation (min): 16 min  Charges: OT General Charges $OT Visit: 1 Visit OT Treatments $Self Care/Home Management : 8-22 mins   Hope Budds 04/09/2023, 12:50 PM

## 2023-04-09 NOTE — Care Management Important Message (Signed)
Important Message  Patient Details  Name: Laurie Fox MRN: [id number] Date of Birth: [birth year]   Medicare Important Message Given:  Yes     Sherilyn Banker 04/09/2023, 12:33 PM

## 2023-04-09 NOTE — TOC Progression Note (Addendum)
Transition of Care [facility]) - Progression Note    Patient Details  Name: Laurie Fox MRN: [id number] Date of Birth: 04/22/1932  Transition of Care [facility] CM/SW Contact  Mearl Latin, LCSW Phone Number: 04/09/2023, 9:00 AM  Clinical Narrative:    9:00 AM-CSW left voicemail for patient's son to make him aware of discharge today. CSW spoke with Step at [facility] and she stated [facility] is not in yet but CSW can fax DC Summary and Fl2 to [name] [phone]). CSW made her aware that oxygen has been ordered from [employer] to be delivered prior to transport.   Son returned call and requested PTAR for safety.  11:20 AM-CSW faxed signed FL2 to ALF. Per [employer], patient's oxygen set up has been assigned to a technician for today.   2:19 PM-CSW spoke with Steph at [facility] and she confirmed oxygen has not arrived yet. She stated they did not receive a fax yet either as they have been having technical issues. CSW emailed her info to [email or web address] and will await oxygen arrival.   2:48 PM-CSW reached out to [employer] and was told the oxygen was signed for already. CSW called ALF back and made Steph aware. She stated she may have been at lunch when it arrived. CSW can call for transport. CSW updated patient's daughter, Chales Abrahams.  Expected Discharge Plan: Assisted Living Barriers to Discharge: Equipment Delay  Expected Discharge Plan and Services In-house Referral: Clinical Social Work   Post Acute Care Choice: Home Health Living arrangements for the past 2 months: Assisted Living Facility Expected Discharge Date: 04/09/23                                     Social Determinants of Health (SDOH) Interventions SDOH Screenings   Food Insecurity: No Food Insecurity (04/02/2023)  Housing: Low Risk  (04/02/2023)  Transportation Needs: No Transportation Needs (04/02/2023)  Utilities: Not At Risk (04/02/2023)  Tobacco Use: Unknown (01/12/2023)    Readmission Risk  Interventions    01/11/2023    2:10 PM  Readmission Risk Prevention Plan  Transportation Screening Complete  PCP or Specialist Appt within 5-7 Days Complete  Home Care Screening Complete  Medication Review (RN CM) Complete

## 2023-04-09 NOTE — Discharge Summary (Signed)
PATIENT DETAILS Name: Laurie Fox Age: 89 y.o. Sex: female Date of Birth: 1932/02/26 MRN: [id number]. Admitting Physician: Tonye Royalty, DO ION:[id number], Ronaldo Miyamoto, DO  Admit Date: 04/02/2023 Discharge date: 04/09/2023  Recommendations for Outpatient Follow-up:  Follow up with PCP in 1-2 weeks Please obtain CMP/CBC in one week Wean off oxygen as tolerated over the next several weeks.  Minimal oxygen requirement-mostly with ambulation.  Admitted From:  ALF   Disposition: ALF with home health   Discharge Condition: good  CODE STATUS:   Code Status: Full Code   Diet recommendation:  Diet Order             Diet - low sodium heart healthy           Diet heart healthy/carb modified Room service appropriate? Yes; Fluid consistency: Thin  Diet effective now                    Brief Summary: 89 year old with HTN, HLD, DM-2, chronic HFpEF-who apparently lives at ALF-was at PCPs office when she had an episode where she started drooling, and was unable to speak-she was found to have elevated hypertension-and brought to the ED where she had a episode of syncope when getting a CT scan of the head. She was also found to have hypoxia requiring around 4 L of oxygen. She was then admitted to the hospitalist service.    Significant events: 8/27>> admit to [facility] 8/30>> sinus pause of around 8 seconds following a choking event.  EP consulted-no PPM-likely vagal episode due to choking.   Significant studies: 8/16>> echo: EF 55-60%. 8/27>>CT head: No acute abnormalities 8/27>>CT angiogram chest: No PE-bibasilar atelectasis versus pneumonia. 8/28>> MRI brain: No acute CVA   Significant microbiology data: None   Procedures: None   Consults: Electrophysiology  Brief Hospital Course: Syncope while in the emergency room Unclear etiology-but suspect related to uncontrolled hypertension. Recent echo on 8/16-EF preserved EEG negative MRI brain nonacute   Sinus pause of  around 8 seconds likely due to vagal episode after choking on a pill on 8/30 No PPM per EP Given bradycardia-all rate limiting agents on hold Telemetry monitoring-no further pauses.   Drooling/possible right facial droop while at PCPs office Unclear exactly what her deficits were-apparently this was very brief-and occurred prior to her presenting to the ED could have been related to uncontrolled hypertension MRI brain negative for CVA-doubt further workup required.   Hypertensive emergency BP in the 220s-230s systolic when she first presented Per patient-her blood pressure is difficult to control Multiple medication adjustment over the past several days-now much better on irbesartan/Lasix/amlodipine/Imdur and hydralazine Beta-blocker discontinued due to bradycardia and an 8-second pause. PCP to continue to optimize in the outpatient setting.   Sinus bradycardia Mostly nocturnal-asymptomatic Beta-blocker on hold-no plan to resume on discharge Telemetry monitoring TSH-stable   Acute hypoxic respiratory failure secondary to presumed aspiration PNA and HFpEF exacerbation in the setting of uncontrolled hypertension Overall improved-still some trace lower extremity edema but much better than how she first presented Completed a course of antibiotics Treated with initially IV diuretics then switch to oral diuretics Minimal to no oxygen requirement at rest-does require only 2 L of oxygen with ambulation Suspect she will require home O2 short-term-PCP to consider weaning off over the next several weeks.    CKD stage IIIa Creatinine close to baseline   HLD Statin   GERD PPI   DM-2 (A1c 6.7 on 6/6) SSI while inpatient Resume oral hypoglycemics on discharge  Obesity: Estimated body mass index is 33.99 kg/m as calculated from the following:   Height as of this encounter: 5\' 2"  (1.575 m).   Weight as of this encounter: 84.3 kg.     Discharge Diagnoses:  Principal Problem:   Near  syncope Active Problems:   Congestive heart failure (HCC)   Pneumonia due to infectious organism   Discharge Instructions:  Activity:  As tolerated with Full fall precautions use walker/cane & assistance as needed  Discharge Instructions     Call MD for:  extreme fatigue   Complete by: As directed    Call MD for:  persistant nausea and vomiting   Complete by: As directed    Diet - low sodium heart healthy   Complete by: As directed    Discharge instructions   Complete by: As directed    Follow with Primary MD  Shireen Quan, DO in 1-2 weeks  Please get a complete blood count and chemistry panel checked by your Primary MD at your next visit, and again as instructed by your Primary MD.  Get Medicines reviewed and adjusted: Please take all your medications with you for your next visit with your Primary MD  Laboratory/radiological data: Please request your Primary MD to go over all hospital tests and procedure/radiological results at the follow up, please ask your Primary MD to get all Hospital records sent to his/her office.  In some cases, they will be blood work, cultures and biopsy results pending at the time of your discharge. Please request that your primary care M.D. follows up on these results.  Also Note the following: If you experience worsening of your admission symptoms, develop shortness of breath, life threatening emergency, suicidal or homicidal thoughts you must seek medical attention immediately by calling 911 or calling your MD immediately  if symptoms less severe.  You must read complete instructions/literature along with all the possible adverse reactions/side effects for all the Medicines you take and that have been prescribed to you. Take any new Medicines after you have completely understood and accpet all the possible adverse reactions/side effects.   Do not drive when taking Pain medications or sleeping medications (Benzodaizepines)  Do not take more than  prescribed Pain, Sleep and Anxiety Medications. It is not advisable to combine anxiety,sleep and pain medications without talking with your primary care practitioner  Special Instructions: If you have smoked or chewed Tobacco  in the last 2 yrs please stop smoking, stop any regular Alcohol  and or any Recreational drug use.  Wear Seat belts while driving.  Please note: You were cared for by a hospitalist during your hospital stay. Once you are discharged, your primary care physician will handle any further medical issues. Please note that NO REFILLS for any discharge medications will be authorized once you are discharged, as it is imperative that you return to your primary care physician (or establish a relationship with a primary care physician if you do not have one) for your post hospital discharge needs so that they can reassess your need for medications and monitor your lab values.   Increase activity slowly   Complete by: As directed       Allergies as of 04/09/2023       Reactions   Codeine Nausea And Vomiting   Other Reaction(s): dizziness   Fexofenadine    Other Reaction(s): h/a   Loteprednol Etabonate    Other Reaction(s): swelling & dificulty breathing, chest pain & dizziness   Sulfa Antibiotics Rash  Medication List     STOP taking these medications    carvedilol 25 MG tablet Commonly known as: COREG   telmisartan-hydrochlorothiazide 80-25 MG tablet Commonly known as: MICARDIS HCT       TAKE these medications    acetaminophen 500 MG tablet Commonly known as: TYLENOL Take 1,000 mg by mouth every 8 (eight) hours as needed for moderate pain, fever or headache.   amLODipine 10 MG tablet Commonly known as: NORVASC Take 1 tablet (10 mg total) by mouth daily.   anastrozole 1 MG tablet Commonly known as: ARIMIDEX TAKE 1 TABLET BY MOUTH  DAILY   ASCORBIC ACID PO Take 1 tablet by mouth daily. Vitamin C, unknown strength.   aspirin 81 MG chewable  tablet Chew 81 mg by mouth 2 (two) times daily with a meal.   famotidine 20 MG tablet Commonly known as: PEPCID Take 20 mg by mouth daily.   furosemide 40 MG tablet Commonly known as: LASIX Take 1 tablet (40 mg total) by mouth daily as needed (swelling).   hydrALAZINE 100 MG tablet Commonly known as: APRESOLINE Take 1 tablet (100 mg total) by mouth every 8 (eight) hours.   irbesartan 150 MG tablet Commonly known as: AVAPRO Take 1 tablet (150 mg total) by mouth daily.   isosorbide mononitrate 30 MG 24 hr tablet Commonly known as: IMDUR Take 1 tablet (30 mg total) by mouth daily.   melatonin 3 MG Tabs tablet Take 3 mg by mouth at bedtime.   metFORMIN 1000 MG tablet Commonly known as: GLUCOPHAGE Take 1,000 mg by mouth 2 (two) times daily.   One-A-Day Womens 50+ Tabs Take 1 tablet by mouth daily in the afternoon.   simvastatin 20 MG tablet Commonly known as: ZOCOR Take 20 mg by mouth daily.               Durable Medical Equipment  (From admission, onward)           Start     Ordered   04/09/23 0647  For home use only DME oxygen  Once       Question Answer Comment  Length of Need 6 Months   Mode or (Route) Nasal cannula   Liters per Minute 2   Oxygen delivery system Gas      04/09/23 0646            Follow-up Information     Shireen Quan, DO. Go in 1 week(s).   Specialty: Family Medicine Contact information: [address] [place] [address] [phone]                Allergies  Allergen Reactions   Codeine Nausea And Vomiting    Other Reaction(s): dizziness   Fexofenadine     Other Reaction(s): h/a   Loteprednol Etabonate     Other Reaction(s): swelling & dificulty breathing, chest pain & dizziness   Sulfa Antibiotics Rash     Other Procedures/Studies: DG Chest Port 1V same Day  Result Date: 04/05/2023 CLINICAL DATA:  Shortness of breath EXAM: PORTABLE CHEST 1 VIEW COMPARISON:  03/19/2023 FINDINGS: Cardiomegaly.  Unchanged layering bilateral pleural effusions. Unchanged mild diffuse interstitial opacity. No new airspace opacity. Severe bilateral glenohumeral arthrosis. IMPRESSION: Cardiomegaly with unchanged layering bilateral pleural effusions and mild diffuse interstitial opacity, consistent with edema. No new airspace opacity. Electronically Signed   By: Jearld Lesch M.D.   On: 04/05/2023 08:17   EEG adult  Result Date: 04/03/2023 Charlsie Quest, MD  04/04/2023 11:50 AM Patient Name: Laurie Fox MRN: [id number] Epilepsy Attending: Charlsie Quest Referring Physician/Provider: Maretta Bees, MD Date: 04/03/2023 Duration: 26.08 mins Patient history: 89yo F with an episode of unresponsiveness getting eeg to evaluate for seizure. Level of alertness: Awake, asleep AEDs during EEG study: None Technical aspects: This EEG study was done with scalp electrodes positioned according to the 10-20 International system of electrode placement. Electrical activity was reviewed with band pass filter of 1-70Hz , sensitivity of 7 uV/mm, display speed of 27mm/sec with a 60Hz  notched filter applied as appropriate. EEG data were recorded continuously and digitally stored.  Video monitoring was available and reviewed as appropriate. Description: The posterior dominant rhythm consists of 8 Hz activity of moderate voltage (25-35 uV) seen predominantly in posterior head regions, symmetric and reactive to eye opening and eye closing. Sleep was characterized by sleep spindles (12 to 14 Hz), maximal frontocentral region. EEG showed intermittent generalized 3 to 6 Hz theta-delta slowing. Hyperventilation and photic stimulation were not performed.   ABNORMALITY - Intermittent slow, generalized IMPRESSION: This study is suggestive of mild diffuse encephalopathy, nonspecific etiology. No seizures or epileptiform discharges were seen throughout the recording. Charlsie Quest   MR BRAIN WO CONTRAST  Result Date: 04/03/2023 CLINICAL  DATA:  Transient episode of unresponsiveness, facial drooping, drooling, and difficulty speaking. EXAM: MRI HEAD WITHOUT CONTRAST TECHNIQUE: Multiplanar, multiecho pulse sequences of the brain and surrounding structures were obtained without intravenous contrast. COMPARISON:  CT head 1 day prior FINDINGS: Brain: There is no acute intracranial hemorrhage, extra-axial fluid collection, or acute infarct Parenchymal volume is within expected limits for age. The ventricles are normal in size. Patchy FLAIR signal abnormality in the subcortical and periventricular white matter likely reflects sequela of underlying chronic small-vessel ischemic change, mild for age. The pituitary and suprasellar region are normal. There is no mass lesion. There is no mass effect or midline shift. Vascular: Normal flow voids. Skull and upper cervical spine: Normal marrow signal. Sinuses/Orbits: There is mild mucosal thickening in the paranasal sinuses. The globes and orbits are unremarkable. Other: The mastoid air cells and middle ear cavities are clear. IMPRESSION: No acute intracranial pathology. Electronically Signed   By: Lesia Hausen M.D.   On: 04/03/2023 10:48   CT Angio Chest PE W and/or Wo Contrast  Result Date: 04/02/2023 CLINICAL DATA:  Concern for pulmonary embolism. Shortness of breath. EXAM: CT ANGIOGRAPHY CHEST WITH CONTRAST TECHNIQUE: Multidetector CT imaging of the chest was performed using the standard protocol during bolus administration of intravenous contrast. Multiplanar CT image reconstructions and MIPs were obtained to evaluate the vascular anatomy. RADIATION DOSE REDUCTION: This exam was performed according to the departmental dose-optimization program which includes automated exposure control, adjustment of the mA and/or kV according to patient size and/or use of iterative reconstruction technique. CONTRAST:  75mL OMNIPAQUE IOHEXOL 350 MG/ML SOLN COMPARISON:  Chest radiograph dated 03/19/2023. FINDINGS:  Cardiovascular: Mild cardiomegaly. No pericardial effusion. There is coronary vascular calcification and calcification of the mitral annulus. Moderate atherosclerotic calcification of the thoracic aorta. No pulmonary artery embolus identified. Mediastinum/Nodes: No hilar adenopathy. The esophagus is grossly unremarkable. No mediastinal fluid collection. Lungs/Pleura: Small bilateral pleural effusions and bibasilar atelectasis. Pneumonia is not excluded. Clinical correlation is recommended. There is no pneumothorax. The central airways are patent. Upper Abdomen: No acute abnormality. Musculoskeletal: Diffuse subcutaneous edema and anasarca. Osteopenia with degenerative changes. No acute osseous pathology. Multiple surgical clips in the right breast. Review of the MIP images confirms the above findings.  IMPRESSION: 1. No CT evidence of pulmonary embolism. 2. Small bilateral pleural effusions and bibasilar atelectasis. Pneumonia is not excluded. 3.  Aortic Atherosclerosis (ICD10-I70.0). Electronically Signed   By: Elgie Collard M.D.   On: 04/02/2023 20:31   CT HEAD WO CONTRAST  Result Date: 04/02/2023 CLINICAL DATA:  Altered mental status. EXAM: CT HEAD WITHOUT CONTRAST TECHNIQUE: Contiguous axial images were obtained from the base of the skull through the vertex without intravenous contrast. RADIATION DOSE REDUCTION: This exam was performed according to the departmental dose-optimization program which includes automated exposure control, adjustment of the mA and/or kV according to patient size and/or use of iterative reconstruction technique. COMPARISON:  January 06, 2023 FINDINGS: Brain: There is mild cerebral atrophy with widening of the extra-axial spaces and ventricular dilatation. There are areas of decreased attenuation within the white matter tracts of the supratentorial brain, consistent with microvascular disease changes. Vascular: There is marked severity bilateral cavernous carotid artery calcification.  Skull: Normal. Negative for fracture or focal lesion. Sinuses/Orbits: No acute finding. Other: None. IMPRESSION: 1. Generalized cerebral atrophy with chronic white matter small vessel ischemic changes. 2. No acute intracranial abnormality. Electronically Signed   By: Aram Candela M.D.   On: 04/02/2023 19:53   DG Chest 2 View  Result Date: 03/25/2023 CLINICAL DATA:  Cough and shortness of breath for the past 4 days. EXAM: CHEST - 2 VIEW COMPARISON:  Chest and right rib radiographs dated 01/10/2023. FINDINGS: Normal sized heart. Tortuous and partially calcified thoracic aorta. Mild linear atelectasis or scarring in both lower lung zones. Small bilateral pleural effusions. Right breast and inferior axillary surgical clips. Healing right 6th, 7th and 8th rib fractures. No pneumothorax. Thoracolumbar spine degenerative changes. IMPRESSION: 1. Small bilateral pleural effusions. 2. Mild bibasilar atelectasis or scarring. 3. Healing right 6th, 7th and 8th rib fractures. Electronically Signed   By: Beckie Salts M.D.   On: 03/25/2023 22:40   ECHOCARDIOGRAM COMPLETE  Result Date: 03/22/2023    ECHOCARDIOGRAM REPORT   Patient Name:   ROBBYE SPANGLER Date of Exam: 03/22/2023 Medical Rec #:  [id number]        Height:       63.0 in Accession #:    [id number]       Weight:       149.3 lb Date of Birth:  1931-10-17        BSA:          1.707 m Patient Age:    89 years         BP:           206/61 mmHg Patient Gender: F                HR:           59 bpm. Exam Location:  Inpatient Procedure: 2D Echo, 3D Echo, Color Doppler, Cardiac Doppler and Strain Analysis Indications:    I10 Hypertension  History:        Patient has prior history of Echocardiogram examinations, most                 recent 01/11/2023. Risk Factors:Hypertension and Diabetes. Breast                 cancer.  Sonographer:    Sheralyn Boatman RDCS Referring Phys: [id number] KYLE LEVITT IMPRESSIONS  1. Left ventricular ejection fraction, by estimation, is 55 to 60%.  Left ventricular ejection fraction by 3D volume is 55 %. The left ventricle has normal function.  The left ventricle has no regional wall motion abnormalities. There is moderate concentric left ventricular hypertrophy. Left ventricular diastolic parameters are consistent with Grade II diastolic dysfunction (pseudonormalization). The average left ventricular global longitudinal strain is -17.2 %. The global longitudinal strain is  abnormal.  2. Right ventricular systolic function is normal. The right ventricular size is moderately enlarged. There is severely elevated pulmonary artery systolic pressure. The estimated right ventricular systolic pressure is 71.2 mmHg.  3. Left atrial size was severely dilated.  4. Right atrial size was moderately dilated.  5. The mitral valve is degenerative. Mild mitral valve regurgitation. No evidence of mitral stenosis. Moderate to severe mitral annular calcification.  6. The aortic valve is tricuspid. Aortic valve regurgitation is mild. Aortic valve sclerosis is present, with no evidence of aortic valve stenosis.  7. The inferior vena cava is dilated in size with <50% respiratory variability, suggesting right atrial pressure of 15 mmHg. Comparison(s): Changes from prior study are noted. EF 55-60%. Severely elevated RVSP. FINDINGS  Left Ventricle: Left ventricular ejection fraction, by estimation, is 55 to 60%. Left ventricular ejection fraction by 3D volume is 55 %. The left ventricle has normal function. The left ventricle has no regional wall motion abnormalities. The average left ventricular global longitudinal strain is -17.2 %. The global longitudinal strain is abnormal. The left ventricular internal cavity size was normal in size. There is moderate concentric left ventricular hypertrophy. Left ventricular diastolic parameters are consistent with Grade II diastolic dysfunction (pseudonormalization). Right Ventricle: The right ventricular size is moderately enlarged. No increase  in right ventricular wall thickness. Right ventricular systolic function is normal. There is severely elevated pulmonary artery systolic pressure. The tricuspid regurgitant velocity is 3.75 m/s, and with an assumed right atrial pressure of 15 mmHg, the estimated right ventricular systolic pressure is 71.2 mmHg. Left Atrium: Left atrial size was severely dilated. Right Atrium: Right atrial size was moderately dilated. Pericardium: Trivial pericardial effusion is present. Mitral Valve: The mitral valve is degenerative in appearance. Moderate to severe mitral annular calcification. Mild mitral valve regurgitation. No evidence of mitral valve stenosis. Tricuspid Valve: The tricuspid valve is grossly normal. Tricuspid valve regurgitation is mild . No evidence of tricuspid stenosis. Aortic Valve: The aortic valve is tricuspid. Aortic valve regurgitation is mild. Aortic valve sclerosis is present, with no evidence of aortic valve stenosis. Pulmonic Valve: The pulmonic valve was grossly normal. Pulmonic valve regurgitation is trivial. No evidence of pulmonic stenosis. Aorta: The aortic root and ascending aorta are structurally normal, with no evidence of dilitation. Venous: The right lower pulmonary vein is normal. The inferior vena cava is dilated in size with less than 50% respiratory variability, suggesting right atrial pressure of 15 mmHg. IAS/Shunts: No atrial level shunt detected by color flow Doppler.  LEFT VENTRICLE PLAX 2D LVIDd:         4.80 cm         Diastology LVIDs:         3.20 cm         LV e' medial:    4.13 cm/s LV PW:         1.80 cm         LV E/e' medial:  23.1 LV IVS:        1.50 cm         LV e' lateral:   6.31 cm/s LVOT diam:     2.30 cm         LV E/e' lateral: 15.2 LV SV:  128 LV SV Index:   75              2D LVOT Area:     4.15 cm        Longitudinal                                Strain                                2D Strain GLS  -17.2 % LV Volumes (MOD)               Avg: LV vol d,  MOD    126.0 ml A2C:                           3D Volume EF LV vol d, MOD    100.0 ml      LV 3D EF:    Left A4C:                                        ventricul LV vol s, MOD    60.7 ml                    ar A2C:                                        ejection LV vol s, MOD    43.0 ml                    fraction A4C:                                        by 3D LV SV MOD A2C:   65.3 ml                    volume is LV SV MOD A4C:   100.0 ml                   55 %. LV SV MOD BP:    63.2 ml                                 3D Volume EF:                                3D EF:        55 %                                LV EDV:       146 ml                                LV ESV:       66 ml  LV SV:        80 ml RIGHT VENTRICLE            IVC RV S prime:     9.57 cm/s  IVC diam: 2.10 cm TAPSE (M-mode): 2.1 cm LEFT ATRIUM              Index        RIGHT ATRIUM           Index LA diam:        5.00 cm  2.93 cm/m   RA Area:     16.40 cm LA Vol (A2C):   114.0 ml 66.77 ml/m  RA Volume:   41.20 ml  24.13 ml/m LA Vol (A4C):   91.6 ml  53.65 ml/m LA Biplane Vol: 107.0 ml 62.67 ml/m  AORTIC VALVE LVOT Vmax:   141.00 cm/s LVOT Vmean:  84.800 cm/s LVOT VTI:    0.309 m  AORTA Ao Root diam: 3.60 cm Ao Asc diam:  3.40 cm MITRAL VALVE                TRICUSPID VALVE MV Area (PHT): 3.17 cm     TR Peak grad:   56.2 mmHg MV Decel Time: 239 msec     TR Vmax:        375.00 cm/s MV E velocity: 95.60 cm/s MV A velocity: 119.00 cm/s  SHUNTS MV E/A ratio:  0.80         Systemic VTI:  0.31 m                             Systemic Diam: 2.30 cm Lennie Odor MD Electronically signed by Lennie Odor MD Signature Date/Time: 03/22/2023/2:11:05 PM    Final      TODAY-DAY OF DISCHARGE:  Subjective:   Van Clines today has no headache,no chest abdominal pain,no new weakness tingling or numbness, feels much better wants to go home today.   Objective:   Blood pressure (!) 163/66, pulse 71, temperature 98 F  (36.7 C), temperature source Oral, resp. rate 18, height 5\' 2"  (1.575 m), weight 84.3 kg, SpO2 96%.  Intake/Output Summary (Last 24 hours) at 04/09/2023 0822 Last data filed at 04/09/2023 0500 Gross per 24 hour  Intake 480 ml  Output 1050 ml  Net -570 ml   Filed Weights   04/06/23 0500 04/07/23 0500 04/08/23 0701  Weight: 86.8 kg 83.6 kg 84.3 kg    Exam: Awake Alert, Oriented *3, No new F.N deficits, Normal affect [place].AT,PERRAL Supple Neck,No JVD, No cervical lymphadenopathy appriciated.  Symmetrical Chest wall movement, Good air movement bilaterally, CTAB RRR,No Gallops,Rubs or new Murmurs, No Parasternal Heave +ve B.Sounds, Abd Soft, Non tender, No organomegaly appriciated, No rebound -guarding or rigidity. No Cyanosis, Clubbing or edema, No new Rash or bruise   PERTINENT RADIOLOGIC STUDIES: No results found.   PERTINENT LAB RESULTS: CBC: No results for input(s): "WBC", "HGB", "HCT", "PLT" in the last 72 hours. CMET CMP     Component Value Date/Time   NA 137 04/08/2023 0215   NA 144 11/30/2014 1504   K 4.3 04/08/2023 0215   K 4.0 11/30/2014 1504   CL 101 04/08/2023 0215   CO2 28 04/08/2023 0215   CO2 24 11/30/2014 1504   GLUCOSE 237 (H) 04/08/2023 0215   GLUCOSE 119 11/30/2014 1504   BUN 35 (H) 04/08/2023 0215   BUN 18.9 11/30/2014 1504   CREATININE 1.31 (H) 04/08/2023 0215  CREATININE 0.8 11/30/2014 1504   CALCIUM 9.0 04/08/2023 0215   CALCIUM 10.0 11/30/2014 1504   PROT 5.7 (L) 04/03/2023 0212   PROT 6.2 (L) 11/30/2014 1504   ALBUMIN 3.4 (L) 04/03/2023 0212   ALBUMIN 4.0 11/30/2014 1504   AST 25 04/03/2023 0212   AST 11 11/30/2014 1504   ALT 32 04/03/2023 0212   ALT 11 11/30/2014 1504   ALKPHOS 142 (H) 04/03/2023 0212   ALKPHOS 68 11/30/2014 1504   BILITOT 0.9 04/03/2023 0212   BILITOT 0.26 11/30/2014 1504   EGFR 70 (L) 11/30/2014 1504   GFRNONAA 39 (L) 04/08/2023 0215    GFR Estimated Creatinine Clearance: 29.9 mL/min (A) (by C-G formula based on  SCr of 1.31 mg/dL (H)). No results for input(s): "LIPASE", "AMYLASE" in the last 72 hours. No results for input(s): "CKTOTAL", "CKMB", "CKMBINDEX", "TROPONINI" in the last 72 hours. Invalid input(s): "POCBNP" No results for input(s): "DDIMER" in the last 72 hours. No results for input(s): "HGBA1C" in the last 72 hours. No results for input(s): "CHOL", "HDL", "LDLCALC", "TRIG", "CHOLHDL", "LDLDIRECT" in the last 72 hours. No results for input(s): "TSH", "T4TOTAL", "T3FREE", "THYROIDAB" in the last 72 hours.  Invalid input(s): "FREET3" No results for input(s): "VITAMINB12", "FOLATE", "FERRITIN", "TIBC", "IRON", "RETICCTPCT" in the last 72 hours. Coags: No results for input(s): "INR" in the last 72 hours.  Invalid input(s): "PT" Microbiology: No results found for this or any previous visit (from the past 240 hour(s)).  FURTHER DISCHARGE INSTRUCTIONS:  Get Medicines reviewed and adjusted: Please take all your medications with you for your next visit with your Primary MD  Laboratory/radiological data: Please request your Primary MD to go over all hospital tests and procedure/radiological results at the follow up, please ask your Primary MD to get all Hospital records sent to his/her office.  In some cases, they will be blood work, cultures and biopsy results pending at the time of your discharge. Please request that your primary care M.D. goes through all the records of your hospital data and follows up on these results.  Also Note the following: If you experience worsening of your admission symptoms, develop shortness of breath, life threatening emergency, suicidal or homicidal thoughts you must seek medical attention immediately by calling 911 or calling your MD immediately  if symptoms less severe.  You must read complete instructions/literature along with all the possible adverse reactions/side effects for all the Medicines you take and that have been prescribed to you. Take any new  Medicines after you have completely understood and accpet all the possible adverse reactions/side effects.   Do not drive when taking Pain medications or sleeping medications (Benzodaizepines)  Do not take more than prescribed Pain, Sleep and Anxiety Medications. It is not advisable to combine anxiety,sleep and pain medications without talking with your primary care practitioner  Special Instructions: If you have smoked or chewed Tobacco  in the last 2 yrs please stop smoking, stop any regular Alcohol  and or any Recreational drug use.  Wear Seat belts while driving.  Please note: You were cared for by a hospitalist during your hospital stay. Once you are discharged, your primary care physician will handle any further medical issues. Please note that NO REFILLS for any discharge medications will be authorized once you are discharged, as it is imperative that you return to your primary care physician (or establish a relationship with a primary care physician if you do not have one) for your post hospital discharge needs so that they can  reassess your need for medications and monitor your lab values.  Total Time spent coordinating discharge including counseling, education and face to face time equals greater than 30 minutes.  Signed: Jeoffrey Massed 04/09/2023 8:22 AM

## 2023-04-09 NOTE — TOC Transition Note (Signed)
Transition of Care [facility]) - CM/SW Discharge Note   Patient Details  Name: Laurie Fox MRN: [id number] Date of Birth: Aug 06, 1932  [facility]) CM/SW Contact:  Mearl Latin, LCSW Phone Number: 04/09/2023, 2:46 PM   Clinical Narrative:    Patient will DC to: [name] Anticipated DC date: 04/09/23 Family notified: Daughter, [place] at bedside Transport by: Sharin Mons   Per MD patient ready for DC to ALF. RN to call report prior to discharge [phone]). RN, patient, patient's family, and facility notified of DC. Discharge Summary and FL2 sent to facility. DC packet on chart. Ambulance transport requested for patient.   CSW will sign off for now as social work intervention is no longer needed. Please consult [place] again if new needs arise.     Final next level of care: Assisted Living Barriers to Discharge: Barriers Resolved   Patient Goals and CMS Choice CMS Medicare.gov Compare Post Acute Care list provided to:: Patient Represenative (must comment) (Son) Choice offered to / list presented to : Adult Children, Patient  Discharge Placement                  Patient to be transferred to facility by: [facility] Name of family member notified: Son Patient and family notified of of transfer: 04/09/23  Discharge Plan and Services Additional resources added to the After Visit Summary for   In-house Referral: Clinical Social Work   Post Acute Care Choice: Home Health          DME Arranged: Oxygen DME Agency: [employer] Date DME Agency Contacted: 04/09/23 Time DME Agency Contacted: [phone] Representative spoke with at DME Agency: Marthann Schiller            Social Determinants of Health (SDOH) Interventions SDOH Screenings   Food Insecurity: No Food Insecurity (04/02/2023)  Housing: Low Risk  (04/02/2023)  Transportation Needs: No Transportation Needs (04/02/2023)  Utilities: Not At Risk (04/02/2023)  Tobacco Use: Unknown (01/12/2023)     Readmission Risk  Interventions    01/11/2023    2:10 PM  Readmission Risk Prevention Plan  Transportation Screening Complete  PCP or Specialist Appt within 5-7 Days Complete  Home Care Screening Complete  Medication Review (RN CM) Complete

## 2023-04-09 NOTE — NC FL2 (Signed)
[place] MEDICAID FL2 LEVEL OF CARE FORM     IDENTIFICATION  Patient Name: Laurie Fox Birthdate: [birth year] Sex: female Admission Date (Current Location): 04/02/2023  [facility] and [place] Number:  Producer, television/film/video and Address:  [facility] [place], [address], [place], [place] [address]      Provider Number: [id number]  Attending Physician Name and Address:  Maretta Bees, MD  Relative Name and Phone Number:       Current Level of Care: Hospital Recommended Level of Care: Assisted Living Facility Prior Approval Number:    Date Approved/Denied:   PASRR Number:    Discharge Plan: Other (Comment) (ALF)    Current Diagnoses: Patient Active Problem List   Diagnosis Date Noted   Congestive heart failure (HCC) 04/03/2023   Pneumonia due to infectious organism 04/03/2023   Near syncope 04/02/2023   Malnutrition of moderate degree 01/12/2023   Closed displaced fracture of right femoral neck (HCC) 01/10/2023   HTN (hypertension) 01/10/2023   Type 2 diabetes mellitus with chronic kidney disease, without long-term current use of insulin (HCC) 01/10/2023   Hip fracture (HCC) 01/10/2023   Breast cancer of upper-inner quadrant of right female breast (HCC) 07/17/2013    Orientation RESPIRATION BLADDER Height & Weight     Self, Time, Situation, Place  O2 (2L nasal cannula) Incontinent Weight: 185 lb 13.6 oz (84.3 kg) Height:  5\' 2"  (157.5 cm)  BEHAVIORAL SYMPTOMS/MOOD NEUROLOGICAL BOWEL NUTRITION STATUS      Continent Diet (Regular)  AMBULATORY STATUS COMMUNICATION OF NEEDS Skin   Limited Assist Verbally Normal                       Personal Care Assistance Level of Assistance  Bathing, Feeding, Dressing Bathing Assistance: Limited assistance Feeding assistance: Limited assistance Dressing Assistance: Limited assistance     Functional Limitations Info  Sight, Hearing Sight Info: Impaired Hearing Info: Impaired      SPECIAL  CARE FACTORS FREQUENCY  PT (By licensed PT), OT (By licensed OT)     PT Frequency: Home Health PT OT Frequency: Home Health OT            Contractures Contractures Info: Not present    Additional Factors Info  Code Status, Allergies Code Status Info: Full Allergies Info: Codeine, Fexofenadine, Loteprednol Etabonate, Sulfa Antibiotics           Current Medications (04/09/2023):    Discharge Medications: STOP taking these medications     carvedilol 25 MG tablet Commonly known as: COREG    telmisartan-hydrochlorothiazide 80-25 MG tablet Commonly known as: MICARDIS HCT           TAKE these medications     acetaminophen 500 MG tablet Commonly known as: TYLENOL Take 1,000 mg by mouth every 8 (eight) hours as needed for moderate pain, fever or headache.    amLODipine 10 MG tablet Commonly known as: NORVASC Take 1 tablet (10 mg total) by mouth daily.    anastrozole 1 MG tablet Commonly known as: ARIMIDEX TAKE 1 TABLET BY MOUTH  DAILY    ASCORBIC ACID PO Take 1 tablet by mouth daily. Vitamin C, unknown strength.    aspirin 81 MG chewable tablet Chew 81 mg by mouth 2 (two) times daily with a meal.    famotidine 20 MG tablet Commonly known as: PEPCID Take 20 mg by mouth daily.    furosemide 40 MG tablet Commonly known as: LASIX Take 1 tablet (40  mg total) by mouth daily as needed (swelling).    hydrALAZINE 100 MG tablet Commonly known as: APRESOLINE Take 1 tablet (100 mg total) by mouth every 8 (eight) hours.    irbesartan 150 MG tablet Commonly known as: AVAPRO Take 1 tablet (150 mg total) by mouth daily.    isosorbide mononitrate 30 MG 24 hr tablet Commonly known as: IMDUR Take 1 tablet (30 mg total) by mouth daily.    melatonin 3 MG Tabs tablet Take 3 mg by mouth at bedtime.    metFORMIN 1000 MG tablet Commonly known as: GLUCOPHAGE Take 1,000 mg by mouth 2 (two) times daily.    One-A-Day Womens 50+ Tabs Take 1 tablet by mouth daily in the  afternoon.    simvastatin 20 MG tablet Commonly known as: ZOCOR Take 20 mg by mouth daily.    Relevant Imaging Results:  Relevant Lab Results:   Additional Information [id number]  Mearl Latin, LCSW

## 2023-04-15 ENCOUNTER — Ambulatory Visit: Payer: Medicare Other | Attending: Cardiology | Admitting: Cardiology

## 2023-04-15 ENCOUNTER — Other Ambulatory Visit: Payer: Self-pay

## 2023-04-15 ENCOUNTER — Inpatient Hospital Stay (HOSPITAL_COMMUNITY)
Admission: EM | Admit: 2023-04-15 | Discharge: 2023-04-19 | DRG: 291 | Disposition: A | Discharge status: Skilled Nursing Facility | Payer: Medicare Other | Attending: Internal Medicine | Admitting: Internal Medicine

## 2023-04-15 ENCOUNTER — Encounter: Payer: Self-pay | Admitting: Cardiology

## 2023-04-15 ENCOUNTER — Emergency Department (HOSPITAL_COMMUNITY): Payer: Medicare Other

## 2023-04-15 ENCOUNTER — Encounter (HOSPITAL_COMMUNITY): Payer: Self-pay

## 2023-04-15 VITALS — BP 146/94 | HR 63 | Ht 62.0 in | Wt 153.2 lb

## 2023-04-15 DIAGNOSIS — Z820 Family history of epilepsy and other diseases of the nervous system: Secondary | ICD-10-CM

## 2023-04-15 DIAGNOSIS — Z6830 Body mass index (BMI) 30.0-30.9, adult: Secondary | ICD-10-CM

## 2023-04-15 DIAGNOSIS — I130 Hypertensive heart and chronic kidney disease with heart failure and stage 1 through stage 4 chronic kidney disease, or unspecified chronic kidney disease: Principal | ICD-10-CM | POA: Diagnosis present

## 2023-04-15 DIAGNOSIS — E1165 Type 2 diabetes mellitus with hyperglycemia: Secondary | ICD-10-CM | POA: Diagnosis present

## 2023-04-15 DIAGNOSIS — Z66 Do not resuscitate: Secondary | ICD-10-CM | POA: Diagnosis present

## 2023-04-15 DIAGNOSIS — I5033 Acute on chronic diastolic (congestive) heart failure: Secondary | ICD-10-CM

## 2023-04-15 DIAGNOSIS — Z882 Allergy status to sulfonamides status: Secondary | ICD-10-CM

## 2023-04-15 DIAGNOSIS — R55 Syncope and collapse: Secondary | ICD-10-CM | POA: Diagnosis not present

## 2023-04-15 DIAGNOSIS — D509 Iron deficiency anemia, unspecified: Secondary | ICD-10-CM | POA: Diagnosis present

## 2023-04-15 DIAGNOSIS — I2510 Atherosclerotic heart disease of native coronary artery without angina pectoris: Secondary | ICD-10-CM | POA: Diagnosis present

## 2023-04-15 DIAGNOSIS — Z79899 Other long term (current) drug therapy: Secondary | ICD-10-CM

## 2023-04-15 DIAGNOSIS — I10 Essential (primary) hypertension: Secondary | ICD-10-CM | POA: Diagnosis not present

## 2023-04-15 DIAGNOSIS — Z79811 Long term (current) use of aromatase inhibitors: Secondary | ICD-10-CM

## 2023-04-15 DIAGNOSIS — I455 Other specified heart block: Secondary | ICD-10-CM

## 2023-04-15 DIAGNOSIS — K219 Gastro-esophageal reflux disease without esophagitis: Secondary | ICD-10-CM | POA: Diagnosis present

## 2023-04-15 DIAGNOSIS — Z7982 Long term (current) use of aspirin: Secondary | ICD-10-CM

## 2023-04-15 DIAGNOSIS — Z888 Allergy status to other drugs, medicaments and biological substances status: Secondary | ICD-10-CM

## 2023-04-15 DIAGNOSIS — Z800 Family history of malignant neoplasm of digestive organs: Secondary | ICD-10-CM

## 2023-04-15 DIAGNOSIS — Z923 Personal history of irradiation: Secondary | ICD-10-CM

## 2023-04-15 DIAGNOSIS — Z7984 Long term (current) use of oral hypoglycemic drugs: Secondary | ICD-10-CM

## 2023-04-15 DIAGNOSIS — Z885 Allergy status to narcotic agent status: Secondary | ICD-10-CM

## 2023-04-15 DIAGNOSIS — Z96641 Presence of right artificial hip joint: Secondary | ICD-10-CM | POA: Diagnosis present

## 2023-04-15 DIAGNOSIS — R601 Generalized edema: Principal | ICD-10-CM

## 2023-04-15 DIAGNOSIS — Z794 Long term (current) use of insulin: Secondary | ICD-10-CM

## 2023-04-15 DIAGNOSIS — Z515 Encounter for palliative care: Secondary | ICD-10-CM

## 2023-04-15 DIAGNOSIS — Z7901 Long term (current) use of anticoagulants: Secondary | ICD-10-CM

## 2023-04-15 DIAGNOSIS — Z853 Personal history of malignant neoplasm of breast: Secondary | ICD-10-CM

## 2023-04-15 DIAGNOSIS — J9611 Chronic respiratory failure with hypoxia: Secondary | ICD-10-CM | POA: Diagnosis present

## 2023-04-15 DIAGNOSIS — N1832 Chronic kidney disease, stage 3b: Secondary | ICD-10-CM | POA: Diagnosis present

## 2023-04-15 DIAGNOSIS — D631 Anemia in chronic kidney disease: Secondary | ICD-10-CM | POA: Diagnosis present

## 2023-04-15 DIAGNOSIS — E785 Hyperlipidemia, unspecified: Secondary | ICD-10-CM | POA: Diagnosis present

## 2023-04-15 DIAGNOSIS — E669 Obesity, unspecified: Secondary | ICD-10-CM | POA: Diagnosis present

## 2023-04-15 DIAGNOSIS — E1122 Type 2 diabetes mellitus with diabetic chronic kidney disease: Secondary | ICD-10-CM | POA: Diagnosis present

## 2023-04-15 DIAGNOSIS — I272 Pulmonary hypertension, unspecified: Secondary | ICD-10-CM | POA: Diagnosis present

## 2023-04-15 DIAGNOSIS — I4891 Unspecified atrial fibrillation: Secondary | ICD-10-CM | POA: Diagnosis not present

## 2023-04-15 LAB — TROPONIN I (HIGH SENSITIVITY): Troponin I (High Sensitivity): 12 ng/L (ref <18)

## 2023-04-15 LAB — CBC WITH DIFFERENTIAL/PLATELET
Abs Immature Granulocytes: 0.13 10*3/uL — ABNORMAL HIGH (ref 0.00–0.07)
Basophils Absolute: 0 10*3/uL (ref 0.0–0.1)
Basophils Relative: 1 %
Eosinophils Absolute: 0.2 10*3/uL (ref 0.0–0.5)
Eosinophils Relative: 3 %
HCT: 29.4 % — ABNORMAL LOW (ref 36.0–46.0)
Hemoglobin: 8.6 g/dL — ABNORMAL LOW (ref 12.0–15.0)
Immature Granulocytes: 2 %
Lymphocytes Relative: 12 %
Lymphs Abs: 0.8 10*3/uL (ref 0.7–4.0)
MCH: 21.9 pg — ABNORMAL LOW (ref 26.0–34.0)
MCHC: 29.3 g/dL — ABNORMAL LOW (ref 30.0–36.0)
MCV: 74.8 fL — ABNORMAL LOW (ref 80.0–100.0)
Monocytes Absolute: 0.7 10*3/uL (ref 0.1–1.0)
Monocytes Relative: 11 %
Neutro Abs: 4.6 10*3/uL (ref 1.7–7.7)
Neutrophils Relative %: 71 %
Platelets: 251 10*3/uL (ref 150–400)
RBC: 3.93 MIL/uL (ref 3.87–5.11)
RDW: 17.6 % — ABNORMAL HIGH (ref 11.5–15.5)
WBC: 6.4 10*3/uL (ref 4.0–10.5)
nRBC: 0 % (ref 0.0–0.2)

## 2023-04-15 LAB — BASIC METABOLIC PANEL
Anion gap: 9 (ref 5–15)
BUN: 32 mg/dL — ABNORMAL HIGH (ref 8–23)
CO2: 30 mmol/L (ref 22–32)
Calcium: 9.8 mg/dL (ref 8.9–10.3)
Chloride: 98 mmol/L (ref 98–111)
Creatinine, Ser: 1.15 mg/dL — ABNORMAL HIGH (ref 0.44–1.00)
GFR, Estimated: 46 mL/min — ABNORMAL LOW (ref >60)
Glucose, Bld: 133 mg/dL — ABNORMAL HIGH (ref 70–99)
Potassium: 4.4 mmol/L (ref 3.5–5.1)
Sodium: 137 mmol/L (ref 135–145)

## 2023-04-15 LAB — BRAIN NATRIURETIC PEPTIDE: B Natriuretic Peptide: 365.7 pg/mL — ABNORMAL HIGH (ref 0.0–100.0)

## 2023-04-15 MED ORDER — FUROSEMIDE 10 MG/ML IJ SOLN
40.0000 mg | Freq: Once | INTRAMUSCULAR | Status: AC
  Administered 2023-04-16: 40 mg via INTRAVENOUS
  Filled 2023-04-15: qty 4

## 2023-04-15 MED ORDER — HYDRALAZINE HCL 25 MG PO TABS
100.0000 mg | ORAL_TABLET | Freq: Once | ORAL | Status: AC
  Administered 2023-04-16: 100 mg via ORAL
  Filled 2023-04-15: qty 4

## 2023-04-15 NOTE — Patient Instructions (Signed)
Medication Instructions:  No changes *If you need a refill on your cardiac medications before your next appointment, please call your pharmacy*   Lab Work: No Labs If you have labs (blood work) drawn today and your tests are completely normal, you will receive your results only by: MyChart Message (if you have MyChart) OR A paper copy in the mail If you have any lab test that is abnormal or we need to change your treatment, we will call you to review the results.   Testing/Procedures: No Testing   Follow-Up: At [facility], you and your health needs are our priority.  As part of our continuing mission to provide you with exceptional heart care, we have created designated Provider Care Teams.  These Care Teams include your primary Cardiologist (physician) and Advanced Practice Providers (APPs -  Physician Assistants and Nurse Practitioners) who all work together to provide you with the care you need, when you need it.  We recommend signing up for the patient portal called "MyChart".  Sign up information is provided on this After Visit Summary.  MyChart is used to connect with patients for Virtual Visits (Telemedicine).  Patients are able to view lab/test results, encounter notes, upcoming appointments, etc.  Non-urgent messages can be sent to your provider as well.   To learn more about what you can do with MyChart, go to [email or web address].    Your next appointment:   Keep Scheduled Appointment  Provider:   Joni Reining, DNP, ANP

## 2023-04-15 NOTE — Progress Notes (Signed)
Cardiology Office Note:    Date:  04/15/2023   ID:  Laurie Fox, DOB [birth year], MRN [id number]  PCP:  Shireen Quan, DO  Cardiologist:  None  Electrophysiologist:  None   Referring MD: Shireen Quan, DO   Chief Complaint  Patient presents with   Congestive Heart Failure    History of Present Illness:    Laurie Fox is a 89 y.o. female with a hx of chronic diastolic heart failure, T2DM, hypertension, hyperlipidemia who presents for follow-up.  She was admitted 04/02/2023 episode of drooling while at PCPs and was unable to speak.  Found to have markedly elevated hypertension, SBP up to 220s to 230s.  She was admitted and had syncopal episode walking head CT.  Head CT was unremarkable.  Brain MRI showed no CVA.  On 8/30 she had a sinus pause around 8 seconds following a choking episode.  EP consulted, recommended no pacemaker likely due to vagal episode from choking.  Echocardiogram showed EF 55 to 60%, moderate LVH, grade 2 diastolic dysfunction, normal RV function, severe pulmonary hypertension (71 mmHg), severe left atrial enlargement, moderate right atrial enlargement, mild mitral regurgitation, mild aortic regurgitation.  Her daughter reports that she has declined significantly.  She is living in assisted living but unable to do her ADLs does not have the support that she needs.  She has gained significant weight.  She reports feeling shortness of breath, denies any chest pain.  Denies any palpitations.  She has been lightheaded but denies any syncope.  Wt Readings from Last 3 Encounters:  04/15/23 153 lb 3.2 oz (69.5 kg)  04/09/23 183 lb 13.8 oz (83.4 kg)  01/12/23 149 lb 4 oz (67.7 kg)     Past Medical History:  Diagnosis Date   Allergy    Arthritis    knees, osteo, shoulders,fingers   Breast cancer (HCC) 07/15/13   right breast    Diabetes mellitus without complication (HCC)    type II   GERD (gastroesophageal reflux disease)    History of radiation therapy  09/15/13-10/12/13   right breast   Hypertension    Personal history of radiation therapy    Wears glasses     Past Surgical History:  Procedure Laterality Date   BREAST BIOPSY Right 07/15/13   bx=mass 1 0'clock, invasive mammary ca, mammary ca in situ   BREAST LUMPECTOMY     BREAST LUMPECTOMY WITH NEEDLE LOCALIZATION AND AXILLARY SENTINEL LYMPH NODE BX Right 08/11/2013   Procedure: RIGHT BREAST LUMPECTOMY WITH NEEDLE LOCALIZATION AND AXILLARY SENTINEL LYMPH NODE BIOPSY;  Surgeon: Almond Lint, MD;  Location: [facility];  Service: General;  Laterality: Right;   COLONOSCOPY     DILATION AND CURETTAGE OF UTERUS     FRACTURE SURGERY  2006   lt foot   KNEE SURGERY  [phone]   lt and rt knee scopes   TONSILLECTOMY     TOTAL HIP ARTHROPLASTY Right 01/12/2023   Procedure: TOTAL HIP ARTHROPLASTY ANTERIOR APPROACH;  Surgeon: Samson Frederic, MD;  Location: [facility] OR;  Service: Orthopedics;  Laterality: Right;    Current Medications: Current Meds  Medication Sig   acetaminophen (TYLENOL) 500 MG tablet Take 1,000 mg by mouth every 8 (eight) hours as needed for moderate pain, fever or headache.   amLODipine (NORVASC) 10 MG tablet Take 1 tablet (10 mg total) by mouth daily.   anastrozole (ARIMIDEX) 1 MG tablet TAKE 1 TABLET BY MOUTH  DAILY   ASCORBIC ACID PO Take 1 tablet by  mouth daily. Vitamin C, unknown strength.   aspirin 81 MG chewable tablet Chew 81 mg by mouth 2 (two) times daily with a meal.   famotidine (PEPCID) 20 MG tablet Take 20 mg by mouth daily.   furosemide (LASIX) 40 MG tablet Take 1 tablet (40 mg total) by mouth daily as needed (swelling).   hydrALAZINE (APRESOLINE) 100 MG tablet Take 1 tablet (100 mg total) by mouth every 8 (eight) hours.   irbesartan (AVAPRO) 150 MG tablet Take 1 tablet (150 mg total) by mouth daily.   isosorbide mononitrate (IMDUR) 30 MG 24 hr tablet Take 1 tablet (30 mg total) by mouth daily.   melatonin 3 MG TABS tablet Take 3 mg by mouth at  bedtime.   metFORMIN (GLUCOPHAGE) 1000 MG tablet Take 1,000 mg by mouth 2 (two) times daily.   Multiple Vitamins-Minerals (ONE-A-DAY WOMENS 50+) TABS Take 1 tablet by mouth daily in the afternoon.   simvastatin (ZOCOR) 20 MG tablet Take 20 mg by mouth daily.     Allergies:   Codeine, Fexofenadine, Loteprednol etabonate, and Sulfa antibiotics   Social History   Socioeconomic History   Marital status: Widowed    Spouse name: Not on file   Number of children: Not on file   Years of education: Not on file   Highest education level: Not on file  Occupational History   Not on file  Tobacco Use   Smoking status: Never   Smokeless tobacco: Not on file  Substance and Sexual Activity   Alcohol use: No   Drug use: No   Sexual activity: Not Currently  Other Topics Concern   Not on file  Social History Narrative   Not on file   Social Determinants of Health   Financial Resource Strain: Not on file  Food Insecurity: No Food Insecurity (04/02/2023)   Hunger Vital Sign    Worried About Running Out of Food in the Last Year: Never true    Ran Out of Food in the Last Year: Never true  Transportation Needs: No Transportation Needs (04/02/2023)   PRAPARE - Administrator, Civil Service (Medical): No    Lack of Transportation (Non-Medical): No  Physical Activity: Not on file  Stress: Not on file  Social Connections: Not on file     Family History: The patient's family history includes Alzheimer's disease in her mother; Colon cancer in her father.  ROS:   Please see the history of present illness.     All other systems reviewed and are negative.  EKGs/Labs/Other Studies Reviewed:    The following studies were reviewed today:   EKG:   04/15/2023: Normal sinus rhythm, rate 63, LVH, left axis deviation  Recent Labs: 04/03/2023: ALT 32; Hemoglobin 8.8; Platelets 252; TSH 4.296 04/05/2023: B Natriuretic Peptide 1,068.8 04/07/2023: Magnesium 1.8 04/08/2023: BUN 35; Creatinine,  Ser 1.31; Potassium 4.3; Sodium 137  Recent Lipid Panel    Component Value Date/Time   CHOL 115 04/03/2023 0212   TRIG 61 04/03/2023 0212   HDL 47 04/03/2023 0212   CHOLHDL 2.4 04/03/2023 0212   VLDL 12 04/03/2023 0212   LDLCALC 56 04/03/2023 0212    Physical Exam:    VS:  BP (!) 146/94   Pulse 63   Ht 5\' 2"  (1.575 m)   Wt 153 lb 3.2 oz (69.5 kg)   SpO2 93%   BMI 28.02 kg/m     Wt Readings from Last 3 Encounters:  04/15/23 153 lb 3.2 oz (69.5 kg)  04/09/23 183 lb 13.8 oz (83.4 kg)  01/12/23 149 lb 4 oz (67.7 kg)     GEN: in no acute distress HEENT: Normal NECK: + JVD CARDIAC: RRR, no murmurs, rubs, gallops RESPIRATORY:  Clear to auscultation without rales, wheezing or rhonchi  ABDOMEN: Soft, non-tender, non-distended MUSCULOSKELETAL:  2+  edema; No deformity  SKIN: Warm and dry NEUROLOGIC:  Alert and oriented x 3 PSYCHIATRIC:  Normal affect   ASSESSMENT:    1. Acute on chronic diastolic congestive heart failure (HCC)   2. Primary hypertension   3. Syncope and collapse   4. Sinus pause    PLAN:    Acute on chronic diastolic heart failure: Echocardiogram showed EF 55 to 60%, moderate LVH, grade 2 diastolic dysfunction, normal RV function, severe pulmonary hypertension (71 mmHg), severe left atrial enlargement, moderate right atrial enlargement, mild mitral regurgitation [place], mild aortic regurgitation.  Received diuresis during admission 03/2023.  Discharged on p.o. Lasix -She is volume overloaded on exam, 2+ lower extremity edema and JVD.  Suspect mostly right heart failure in setting of her pulmonary hypertension, lungs are clear.  Recommend admission for IV diuresis.  Her daughter reports that she has been declining and not able to take care of herself in assisted living anymore, she will need SNF placement on discharge.  Patient and daughter are agreeable with this plan, she will go to [facility]  Failure to thrive: She is currently living in assisted living but  daughter reports she needs higher level of care, unable to do ADLs at this point.  She needs SNF placement.  Recommend admission as above for acute on chronic diastolic heart failure, with plans for SNF placement on discharge  Syncope: Syncopal episode in the ED 8/27.  Unclear etiology, but noted to have markedly elevated BP up to SBP 230s during episode.  Neuro work-up including brain MRI and EEG were negative  Sinus pause: A pause up to 8 seconds during admission 03/2023, thought to be vagally mediated in setting of choking event.  Seen by EP, no indication for pacemaker  Hypertension: Admission 03/2023 with hypertensive emergency, SBP up to 230s.  She was discharged on irbesartan/amlodipine/Imdur/hydralazine/Lasix.  Beta-blocker discontinued due to bradycardia and pauses  CKD stage IIIa: Creatinine 1.3 on 04/08/2023  T2DM: A1c 6.7%.  Hyperlipidemia: Continue statin  RTC in 1 month  Medication Adjustments/Labs and Tests Ordered: Current medicines are reviewed at length with the patient today.  Concerns regarding medicines are outlined above.  Orders Placed This Encounter  Procedures   EKG 12-Lead   EKG 12-Lead   No orders of the defined types were placed in this encounter.   Patient Instructions  Medication Instructions:  No changes *If you need a refill on your cardiac medications before your next appointment, please call your pharmacy*   Lab Work: No Labs If you have labs (blood work) drawn today and your tests are completely normal, you will receive your results only by: MyChart Message (if you have MyChart) OR A paper copy in the mail If you have any lab test that is abnormal or we need to change your treatment, we will call you to review the results.   Testing/Procedures: No Testing   Follow-Up: At [facility], you and your health needs are our priority.  As part of our continuing mission to provide you with exceptional heart care, we have created designated  Provider Care Teams.  These Care Teams include your primary Cardiologist (physician) and Advanced Practice Providers (APPs -  Physician Assistants and Nurse Practitioners) who all work together to provide you with the care you need, when you need it.  We recommend signing up for the patient portal called "MyChart".  Sign up information is provided on this After Visit Summary.  MyChart is used to connect with patients for Virtual Visits (Telemedicine).  Patients are able to view lab/test results, encounter notes, upcoming appointments, etc.  Non-urgent messages can be sent to your provider as well.   To learn more about what you can do with MyChart, go to [email or web address].    Your next appointment:   Keep Scheduled Appointment  Provider:   Joni Reining, DNP, ANP       Signed, Little Ishikawa, MD  04/15/2023 2:09 PM    [facility]

## 2023-04-15 NOTE — ED Provider Triage Note (Signed)
Emergency Medicine Provider Triage Evaluation Note  Laurie Fox , a 89 y.o. female  was evaluated in triage.  Pt complains of bilateral lower extremity edema. H/o heart failure.  Started about 6 days ago per daughter.  Also endorsing shortness of breath but no chest pain. Was seen by her cardiologist earlier today.  On oxygen at night for sleep.  Denies cough and fever.  Review of Systems  Positive: See above Negative: See above  Physical Exam  BP (!) 185/133 (BP Location: Left Arm)   Pulse 69   Temp 97.6 F (36.4 C) (Oral)   Resp 16   Ht 5\' 2"  (1.575 m)   Wt 69.4 kg   SpO2 94%   BMI 27.98 kg/m  Gen:   Awake, no distress   Resp:  Normal effort  MSK:   Moves extremities without difficulty  Other:    Medical Decision Making  Medically screening exam initiated at 9:37 PM.  Appropriate orders placed.  Laurie Fox was informed that the remainder of the evaluation will be completed by another provider, this initial triage assessment does not replace that evaluation, and the importance of remaining in the ED until their evaluation is complete.  Work up started   Gareth Eagle, [place] 04/15/23 2141

## 2023-04-15 NOTE — ED Triage Notes (Signed)
Patient has edema in both legs and now in her arms. Was recently admitted in the hospital. Denies pain. Has heart failure.

## 2023-04-15 NOTE — ED Provider Notes (Signed)
[place] EMERGENCY DEPARTMENT AT [facility] Provider Note   CSN: [id number] Arrival date & time: 04/15/23  1437     History {Add pertinent medical, surgical, social history, OB history to HPI:1} Chief Complaint  Patient presents with   Edema    TENNA DINWIDDIE is a 89 y.o. female.  The history is provided by the patient, a relative and medical records.  LILINOE SANTILLI is a 89 y.o. female who presents to the Emergency Department complaining of *** Icreased lower extremity edema, fatigue, weakness.  Not able to get up to the bathroom.  Currently in ALF. [name].  5-6 days of edema, including face, arm swelling.  Larey Seat out of bed Saturday - had wrist pain, now gone.  She did miss furosemide for 10 days.  No chest pain.  Has sob for 2.5 weeks Has prn oxygen at ALF but only wears at night.  No cough.     Home Medications Prior to Admission medications   Medication Sig Start Date End Date Taking? Authorizing Provider  acetaminophen (TYLENOL) 500 MG tablet Take 1,000 mg by mouth every 8 (eight) hours as needed for moderate pain, fever or headache.    [provider]  amLODipine (NORVASC) 10 MG tablet Take 1 tablet (10 mg total) by mouth daily. 04/09/23   Ghimire, Werner Lean, MD  anastrozole (ARIMIDEX) 1 MG tablet TAKE 1 TABLET BY MOUTH  DAILY 10/13/20   Serena Croissant, MD  ASCORBIC ACID PO Take 1 tablet by mouth daily. Vitamin C, unknown strength.    [provider]  aspirin 81 MG chewable tablet Chew 81 mg by mouth 2 (two) times daily with a meal.    [provider]  famotidine (PEPCID) 20 MG tablet Take 20 mg by mouth daily.    [provider]  furosemide (LASIX) 40 MG tablet Take 1 tablet (40 mg total) by mouth daily as needed (swelling). 04/09/23   Ghimire, Werner Lean, MD  hydrALAZINE (APRESOLINE) 100 MG tablet Take 1 tablet (100 mg total) by mouth every 8 (eight) hours. 04/09/23   Ghimire, Werner Lean, MD  irbesartan (AVAPRO) 150 MG  tablet Take 1 tablet (150 mg total) by mouth daily. 04/09/23   Ghimire, Werner Lean, MD  isosorbide mononitrate (IMDUR) 30 MG 24 hr tablet Take 1 tablet (30 mg total) by mouth daily. 04/09/23   Ghimire, Werner Lean, MD  melatonin 3 MG TABS tablet Take 3 mg by mouth at bedtime.    [provider]  metFORMIN (GLUCOPHAGE) 1000 MG tablet Take 1,000 mg by mouth 2 (two) times daily. 03/26/23   [provider]  Multiple Vitamins-Minerals (ONE-A-DAY WOMENS 50+) TABS Take 1 tablet by mouth daily in the afternoon.    [provider]  simvastatin (ZOCOR) 20 MG tablet Take 20 mg by mouth daily.    [provider]      Allergies    Codeine, Fexofenadine, Loteprednol etabonate, and Sulfa antibiotics    Review of Systems   Review of Systems  All other systems reviewed and are negative.   Physical Exam Updated Vital Signs BP (!) 185/133 (BP Location: Left Arm)   Pulse 69   Temp 97.6 F (36.4 C) (Oral)   Resp 16   Ht 5\' 2"  (1.575 m)   Wt 69.4 kg   SpO2 94%   BMI 27.98 kg/m  Physical Exam Vitals and nursing note reviewed.  Constitutional:      Appearance: She is well-developed.  HENT:  Head: Normocephalic and atraumatic.  Cardiovascular:     Rate and Rhythm: Normal rate and regular rhythm.     Heart sounds: No murmur heard. Pulmonary:     Effort: Pulmonary effort is normal. No respiratory distress.     Comments: Bibasilar crackles Abdominal:     Palpations: Abdomen is soft.     Tenderness: There is no abdominal tenderness. There is no guarding or rebound.  Musculoskeletal:        General: Swelling present. No tenderness.     Comments: 2+ DP pulses bilaterally.  Chronic venous stasis changes to BLE.  There is pitting edema to BLE, left greater than right.  There is edema to BUE, left greater than right.  There is periorbital edema, right greater than left.   Skin:    General: Skin is warm and dry.  Neurological:     Mental Status: She is alert and oriented  to person, place, and time.  Psychiatric:        Behavior: Behavior normal.     ED Results / Procedures / Treatments   Labs (all labs ordered are listed, but only abnormal results are displayed) Labs Reviewed  CBC WITH DIFFERENTIAL/PLATELET - Abnormal; Notable for the following components:      Result Value   Hemoglobin 8.6 (*)    HCT 29.4 (*)    MCV 74.8 (*)    MCH 21.9 (*)    MCHC 29.3 (*)    RDW 17.6 (*)    Abs Immature Granulocytes 0.13 (*)    All other components within normal limits  BASIC METABOLIC PANEL - Abnormal; Notable for the following components:   Glucose, Bld 133 (*)    BUN 32 (*)    Creatinine, Ser 1.15 (*)    GFR, Estimated 46 (*)    All other components within normal limits  BRAIN NATRIURETIC PEPTIDE  TROPONIN I (HIGH SENSITIVITY)  TROPONIN I (HIGH SENSITIVITY)    EKG None  Radiology No results found.  Procedures Procedures  {Document cardiac monitor, telemetry assessment procedure when appropriate:1}  Medications Ordered in ED Medications - No data to display  ED Course/ Medical Decision Making/ A&P   {   Click here for ABCD2, HEART and other calculatorsREFRESH Note before signing :1}                              Medical Decision Making Amount and/or Complexity of Data Reviewed Labs: ordered.   ***  {Document critical care time when appropriate:1} {Document review of labs and clinical decision tools ie heart score, Chads2Vasc2 etc:1}  {Document your independent review of radiology images, and any outside records:1} {Document your discussion with family members, caretakers, and with consultants:1} {Document social determinants of health affecting pt's care:1} {Document your decision making why or why not admission, treatments were needed:1} Final Clinical Impression(s) / ED Diagnoses Final diagnoses:  None    Rx / DC Orders ED Discharge Orders     None

## 2023-04-16 ENCOUNTER — Other Ambulatory Visit: Payer: Self-pay

## 2023-04-16 ENCOUNTER — Inpatient Hospital Stay (HOSPITAL_COMMUNITY): Payer: Medicare Other

## 2023-04-16 DIAGNOSIS — E1122 Type 2 diabetes mellitus with diabetic chronic kidney disease: Secondary | ICD-10-CM | POA: Diagnosis present

## 2023-04-16 DIAGNOSIS — J9611 Chronic respiratory failure with hypoxia: Secondary | ICD-10-CM | POA: Diagnosis present

## 2023-04-16 DIAGNOSIS — I2510 Atherosclerotic heart disease of native coronary artery without angina pectoris: Secondary | ICD-10-CM | POA: Diagnosis present

## 2023-04-16 DIAGNOSIS — Z96641 Presence of right artificial hip joint: Secondary | ICD-10-CM | POA: Diagnosis present

## 2023-04-16 DIAGNOSIS — E785 Hyperlipidemia, unspecified: Secondary | ICD-10-CM | POA: Diagnosis present

## 2023-04-16 DIAGNOSIS — I4891 Unspecified atrial fibrillation: Secondary | ICD-10-CM | POA: Diagnosis not present

## 2023-04-16 DIAGNOSIS — N183 Chronic kidney disease, stage 3 unspecified: Secondary | ICD-10-CM | POA: Diagnosis not present

## 2023-04-16 DIAGNOSIS — I5033 Acute on chronic diastolic (congestive) heart failure: Secondary | ICD-10-CM | POA: Diagnosis present

## 2023-04-16 DIAGNOSIS — Z7901 Long term (current) use of anticoagulants: Secondary | ICD-10-CM | POA: Diagnosis not present

## 2023-04-16 DIAGNOSIS — Z7984 Long term (current) use of oral hypoglycemic drugs: Secondary | ICD-10-CM | POA: Diagnosis not present

## 2023-04-16 DIAGNOSIS — M7989 Other specified soft tissue disorders: Secondary | ICD-10-CM

## 2023-04-16 DIAGNOSIS — Z79899 Other long term (current) drug therapy: Secondary | ICD-10-CM | POA: Diagnosis not present

## 2023-04-16 DIAGNOSIS — Z800 Family history of malignant neoplasm of digestive organs: Secondary | ICD-10-CM | POA: Diagnosis not present

## 2023-04-16 DIAGNOSIS — Z923 Personal history of irradiation: Secondary | ICD-10-CM | POA: Diagnosis not present

## 2023-04-16 DIAGNOSIS — I130 Hypertensive heart and chronic kidney disease with heart failure and stage 1 through stage 4 chronic kidney disease, or unspecified chronic kidney disease: Secondary | ICD-10-CM | POA: Diagnosis present

## 2023-04-16 DIAGNOSIS — N1832 Chronic kidney disease, stage 3b: Secondary | ICD-10-CM | POA: Insufficient documentation

## 2023-04-16 DIAGNOSIS — D631 Anemia in chronic kidney disease: Secondary | ICD-10-CM | POA: Diagnosis present

## 2023-04-16 DIAGNOSIS — E669 Obesity, unspecified: Secondary | ICD-10-CM | POA: Diagnosis present

## 2023-04-16 DIAGNOSIS — K219 Gastro-esophageal reflux disease without esophagitis: Secondary | ICD-10-CM | POA: Diagnosis present

## 2023-04-16 DIAGNOSIS — Z794 Long term (current) use of insulin: Secondary | ICD-10-CM | POA: Diagnosis not present

## 2023-04-16 DIAGNOSIS — Z515 Encounter for palliative care: Secondary | ICD-10-CM | POA: Diagnosis not present

## 2023-04-16 DIAGNOSIS — E1165 Type 2 diabetes mellitus with hyperglycemia: Secondary | ICD-10-CM | POA: Diagnosis present

## 2023-04-16 DIAGNOSIS — I272 Pulmonary hypertension, unspecified: Secondary | ICD-10-CM | POA: Diagnosis present

## 2023-04-16 DIAGNOSIS — D509 Iron deficiency anemia, unspecified: Secondary | ICD-10-CM | POA: Diagnosis present

## 2023-04-16 DIAGNOSIS — Z853 Personal history of malignant neoplasm of breast: Secondary | ICD-10-CM | POA: Diagnosis not present

## 2023-04-16 DIAGNOSIS — Z66 Do not resuscitate: Secondary | ICD-10-CM | POA: Diagnosis present

## 2023-04-16 LAB — CBC
HCT: 30.3 % — ABNORMAL LOW (ref 36.0–46.0)
Hemoglobin: 9 g/dL — ABNORMAL LOW (ref 12.0–15.0)
MCH: 21.8 pg — ABNORMAL LOW (ref 26.0–34.0)
MCHC: 29.7 g/dL — ABNORMAL LOW (ref 30.0–36.0)
MCV: 73.4 fL — ABNORMAL LOW (ref 80.0–100.0)
Platelets: 274 10*3/uL (ref 150–400)
RBC: 4.13 MIL/uL (ref 3.87–5.11)
RDW: 17.5 % — ABNORMAL HIGH (ref 11.5–15.5)
WBC: 10.3 10*3/uL (ref 4.0–10.5)
nRBC: 0 % (ref 0.0–0.2)

## 2023-04-16 LAB — PHOSPHORUS: Phosphorus: 2.8 mg/dL (ref 2.5–4.6)

## 2023-04-16 LAB — COMPREHENSIVE METABOLIC PANEL
ALT: 14 U/L (ref 0–44)
AST: 16 U/L (ref 15–41)
Albumin: 3.4 g/dL — ABNORMAL LOW (ref 3.5–5.0)
Alkaline Phosphatase: 87 U/L (ref 38–126)
Anion gap: 12 (ref 5–15)
BUN: 28 mg/dL — ABNORMAL HIGH (ref 8–23)
CO2: 30 mmol/L (ref 22–32)
Calcium: 9.7 mg/dL (ref 8.9–10.3)
Chloride: 96 mmol/L — ABNORMAL LOW (ref 98–111)
Creatinine, Ser: 1.18 mg/dL — ABNORMAL HIGH (ref 0.44–1.00)
GFR, Estimated: 44 mL/min — ABNORMAL LOW (ref >60)
Glucose, Bld: 203 mg/dL — ABNORMAL HIGH (ref 70–99)
Potassium: 4.1 mmol/L (ref 3.5–5.1)
Sodium: 138 mmol/L (ref 135–145)
Total Bilirubin: 0.8 mg/dL (ref 0.3–1.2)
Total Protein: 6 g/dL — ABNORMAL LOW (ref 6.5–8.1)

## 2023-04-16 LAB — RETICULOCYTES
Immature Retic Fract: 24.2 % — ABNORMAL HIGH (ref 2.3–15.9)
RBC.: 4.1 MIL/uL (ref 3.87–5.11)
Retic Count, Absolute: 59.5 10*3/uL (ref 19.0–186.0)
Retic Ct Pct: 1.5 % (ref 0.4–3.1)

## 2023-04-16 LAB — VITAMIN B12: Vitamin B-12: 346 pg/mL (ref 180–914)

## 2023-04-16 LAB — FERRITIN: Ferritin: 22 ng/mL (ref 11–307)

## 2023-04-16 LAB — IRON AND TIBC
Iron: 33 ug/dL (ref 28–170)
Saturation Ratios: 8 % — ABNORMAL LOW (ref 10.4–31.8)
TIBC: 398 ug/dL (ref 250–450)
UIBC: 365 ug/dL

## 2023-04-16 LAB — GLUCOSE, CAPILLARY
Glucose-Capillary: 282 mg/dL — ABNORMAL HIGH (ref 70–99)
Glucose-Capillary: 172 mg/dL — ABNORMAL HIGH (ref 70–99)

## 2023-04-16 LAB — FOLATE: Folate: 23.8 ng/mL (ref >5.9)

## 2023-04-16 LAB — TSH: TSH: 14.42 u[IU]/mL — ABNORMAL HIGH (ref 0.350–4.500)

## 2023-04-16 LAB — TROPONIN I (HIGH SENSITIVITY): Troponin I (High Sensitivity): 12 ng/L (ref <18)

## 2023-04-16 LAB — MAGNESIUM: Magnesium: 1.9 mg/dL (ref 1.7–2.4)

## 2023-04-16 MED ORDER — ASPIRIN 81 MG PO CHEW
81.0000 mg | CHEWABLE_TABLET | Freq: Every day | ORAL | Status: DC
  Administered 2023-04-16 – 2023-04-17 (×2): 81 mg via ORAL
  Filled 2023-04-16 (×2): qty 1

## 2023-04-16 MED ORDER — DILTIAZEM LOAD VIA INFUSION
15.0000 mg | Freq: Once | INTRAVENOUS | Status: AC
  Administered 2023-04-16: 15 mg via INTRAVENOUS
  Filled 2023-04-16: qty 15

## 2023-04-16 MED ORDER — FAMOTIDINE 20 MG PO TABS
20.0000 mg | ORAL_TABLET | Freq: Every day | ORAL | Status: DC
  Administered 2023-04-16 – 2023-04-19 (×4): 20 mg via ORAL
  Filled 2023-04-16 (×4): qty 1

## 2023-04-16 MED ORDER — ANASTROZOLE 1 MG PO TABS
1.0000 mg | ORAL_TABLET | Freq: Every day | ORAL | Status: DC
  Administered 2023-04-16 – 2023-04-19 (×4): 1 mg via ORAL
  Filled 2023-04-16 (×4): qty 1

## 2023-04-16 MED ORDER — ENOXAPARIN SODIUM 80 MG/0.8ML IJ SOSY
1.0000 mg/kg | PREFILLED_SYRINGE | INTRAMUSCULAR | Status: DC
  Administered 2023-04-16 – 2023-04-17 (×2): 70 mg via SUBCUTANEOUS
  Filled 2023-04-16: qty 0.7
  Filled 2023-04-16: qty 0.8

## 2023-04-16 MED ORDER — ASPIRIN 81 MG PO CHEW: 81.0000 mg | CHEWABLE_TABLET | Freq: Two times a day (BID) | ORAL | Status: DC

## 2023-04-16 MED ORDER — POLYETHYLENE GLYCOL 3350 17 G PO PACK: 17.0000 g | PACK | Freq: Every day | ORAL | Status: DC | PRN

## 2023-04-16 MED ORDER — FUROSEMIDE 10 MG/ML IJ SOLN
20.0000 mg | Freq: Two times a day (BID) | INTRAMUSCULAR | Status: DC
  Administered 2023-04-16: 20 mg via INTRAVENOUS
  Filled 2023-04-16: qty 4

## 2023-04-16 MED ORDER — PROCHLORPERAZINE EDISYLATE 10 MG/2ML IJ SOLN: 5.0000 mg | Freq: Four times a day (QID) | INTRAMUSCULAR | Status: DC | PRN

## 2023-04-16 MED ORDER — SIMVASTATIN 20 MG PO TABS
20.0000 mg | ORAL_TABLET | Freq: Every day | ORAL | Status: DC
  Administered 2023-04-16 – 2023-04-18 (×3): 20 mg via ORAL
  Filled 2023-04-16 (×3): qty 1

## 2023-04-16 MED ORDER — INSULIN ASPART 100 UNIT/ML IJ SOLN
0.0000 [IU] | Freq: Every day | INTRAMUSCULAR | Status: DC
  Administered 2023-04-18: 1 [IU] via SUBCUTANEOUS
  Filled 2023-04-16: qty 0.05

## 2023-04-16 MED ORDER — EMPAGLIFLOZIN 10 MG PO TABS
10.0000 mg | ORAL_TABLET | Freq: Every day | ORAL | Status: DC
  Administered 2023-04-16 – 2023-04-19 (×4): 10 mg via ORAL
  Filled 2023-04-16 (×4): qty 1

## 2023-04-16 MED ORDER — DILTIAZEM HCL-DEXTROSE 125-5 MG/125ML-% IV SOLN (PREMIX)
5.0000 mg/h | INTRAVENOUS | Status: DC
  Administered 2023-04-16: 15 mg/h via INTRAVENOUS
  Administered 2023-04-16: 5 mg/h via INTRAVENOUS
  Filled 2023-04-16 (×4): qty 125

## 2023-04-16 MED ORDER — ACETAMINOPHEN 325 MG PO TABS: 650.0000 mg | ORAL_TABLET | Freq: Four times a day (QID) | ORAL | Status: DC | PRN

## 2023-04-16 MED ORDER — ISOSORBIDE MONONITRATE ER 30 MG PO TB24
30.0000 mg | ORAL_TABLET | Freq: Every day | ORAL | Status: DC
  Administered 2023-04-16 – 2023-04-19 (×4): 30 mg via ORAL
  Filled 2023-04-16 (×4): qty 1

## 2023-04-16 MED ORDER — ENOXAPARIN SODIUM 40 MG/0.4ML IJ SOSY: 40.0000 mg | PREFILLED_SYRINGE | INTRAMUSCULAR | Status: DC

## 2023-04-16 MED ORDER — FUROSEMIDE 10 MG/ML IJ SOLN
40.0000 mg | Freq: Two times a day (BID) | INTRAMUSCULAR | Status: AC
  Administered 2023-04-16 – 2023-04-17 (×3): 40 mg via INTRAVENOUS
  Filled 2023-04-16 (×3): qty 4

## 2023-04-16 MED ORDER — POTASSIUM CHLORIDE CRYS ER 20 MEQ PO TBCR
20.0000 meq | EXTENDED_RELEASE_TABLET | Freq: Two times a day (BID) | ORAL | Status: DC
  Administered 2023-04-16 (×2): 20 meq via ORAL
  Filled 2023-04-16 (×2): qty 1

## 2023-04-16 MED ORDER — MELATONIN 5 MG PO TABS: 5.0000 mg | ORAL_TABLET | Freq: Every evening | ORAL | Status: DC | PRN

## 2023-04-16 MED ORDER — INSULIN ASPART 100 UNIT/ML IJ SOLN
0.0000 [IU] | Freq: Three times a day (TID) | INTRAMUSCULAR | Status: DC
  Administered 2023-04-16: 5 [IU] via SUBCUTANEOUS
  Administered 2023-04-17: 1 [IU] via SUBCUTANEOUS
  Administered 2023-04-17: 2 [IU] via SUBCUTANEOUS
  Administered 2023-04-17: 1 [IU] via SUBCUTANEOUS
  Administered 2023-04-18: 2 [IU] via SUBCUTANEOUS
  Administered 2023-04-18: 3 [IU] via SUBCUTANEOUS
  Administered 2023-04-18 – 2023-04-19 (×2): 1 [IU] via SUBCUTANEOUS
  Administered 2023-04-19: 2 [IU] via SUBCUTANEOUS
  Filled 2023-04-16: qty 0.09

## 2023-04-16 NOTE — Progress Notes (Signed)
Bilateral lower extremity venous duplex has been completed. Preliminary results can be found in CV Proc through chart review.   04/16/23 9:17 AM Olen Cordial RVT

## 2023-04-16 NOTE — ED Notes (Signed)
ED TO INPATIENT HANDOFF REPORT  Name/Age/Gender Laurie Fox 89 y.o. female  Code Status    Code Status Orders  (From admission, onward)           Start     Ordered   04/16/23 0101  Full code  Continuous       Question:  By:  Answer:  Consent: discussion documented in EHR   04/16/23 0100           Code Status History     Date Active Date Inactive Code Status Order ID Comments User Context   04/03/2023 0047 04/10/2023 0157 Full Code [id number]  Tonye Royalty, DO Inpatient   01/10/2023 1540 01/15/2023 2258 Full Code [id number]  Emeline General, MD ED      Advance Directive Documentation    Flowsheet Row Most Recent Value  Type of Advance Directive Living will  Pre-existing out of facility DNR order (yellow form or pink MOST form) --  "MOST" Form in Place? --       Home/SNF/Other Home  Chief Complaint Acute on chronic diastolic (congestive) heart failure (HCC) [I50.33]  Level of Care/Admitting Diagnosis ED Disposition     ED Disposition  Admit   Condition  --   Comment  Hospital Area: [facility] [[id number]]  Level of Care: Progressive [102]  Admit to Progressive based on following criteria: CARDIOVASCULAR & THORACIC of moderate stability with acute coronary syndrome symptoms/low risk myocardial infarction/hypertensive urgency/arrhythmias/heart failure potentially compromising stability and stable post cardiovascular intervention patients.  May admit patient to [facility] or [facility] if equivalent level of care is available:: Yes  Covid Evaluation: Asymptomatic - no recent exposure (last 10 days) testing not required  Diagnosis: Acute on chronic diastolic (congestive) heart failure [facility]) [[id number]]  Admitting Physician: Darlin Drop [[id number]]  Attending Physician: Darlin Drop [[id number]]  Certification:: I certify this patient will need inpatient services for at least 2 midnights  Expected Medical Readiness: 04/18/2023           Medical History Past Medical History:  Diagnosis Date   Allergy    Arthritis    knees, osteo, shoulders,fingers   Breast cancer (HCC) 07/15/13   right breast    Diabetes mellitus without complication (HCC)    type II   GERD (gastroesophageal reflux disease)    History of radiation therapy 09/15/13-10/12/13   right breast   Hypertension    Personal history of radiation therapy    Wears glasses     Allergies Allergies  Allergen Reactions   Codeine Nausea And Vomiting    Other Reaction(s): dizziness   Fexofenadine     Other Reaction(s): h/a   Loteprednol Etabonate     Other Reaction(s): swelling & dificulty breathing, chest pain & dizziness   Sulfa Antibiotics Rash    IV Location/Drains/Wounds Patient Lines/Drains/Airways Status     Active Line/Drains/Airways     Name Placement date Placement time Site Days   Peripheral IV 04/16/23 20 G 1" Left Antecubital 04/16/23  0025  Antecubital  less than 1            Labs/Imaging Results for orders placed or performed during the hospital encounter of 04/15/23 (from the past 48 hour(s))  CBC with Differential     Status: Abnormal   Collection Time: 04/15/23  4:04 PM  Result Value Ref Range   WBC 6.4 4.0 - 10.5 K/uL   RBC 3.93 3.87 - 5.11 MIL/uL   Hemoglobin 8.6 (L) 12.0 -  15.0 g/dL    Comment: Reticulocyte Hemoglobin testing may be clinically indicated, consider ordering this additional test [id number]    HCT 29.4 (L) 36.0 - 46.0 %   MCV 74.8 (L) 80.0 - 100.0 fL   MCH 21.9 (L) 26.0 - 34.0 pg   MCHC 29.3 (L) 30.0 - 36.0 g/dL   RDW 29.5 (H) 18.8 - 41.6 %   Platelets 251 150 - 400 K/uL   nRBC 0.0 0.0 - 0.2 %   Neutrophils Relative % 71 %   Neutro Abs 4.6 1.7 - 7.7 K/uL   Lymphocytes Relative 12 %   Lymphs Abs 0.8 0.7 - 4.0 K/uL   Monocytes Relative 11 %   Monocytes Absolute 0.7 0.1 - 1.0 K/uL   Eosinophils Relative 3 %   Eosinophils Absolute 0.2 0.0 - 0.5 K/uL   Basophils Relative 1 %   Basophils Absolute 0.0  0.0 - 0.1 K/uL   Immature Granulocytes 2 %   Abs Immature Granulocytes 0.13 (H) 0.00 - 0.07 K/uL    Comment: Performed at [facility], [address], [place], [place] [address]  Basic metabolic panel     Status: Abnormal   Collection Time: 04/15/23  4:04 PM  Result Value Ref Range   Sodium 137 135 - 145 mmol/L   Potassium 4.4 3.5 - 5.1 mmol/L   Chloride 98 98 - 111 mmol/L   CO2 30 22 - 32 mmol/L   Glucose, Bld 133 (H) 70 - 99 mg/dL    Comment: Glucose reference range applies only to samples taken after fasting for at least 8 hours.   BUN 32 (H) 8 - 23 mg/dL   Creatinine, Ser 1.60 (H) 0.44 - 1.00 mg/dL   Calcium 9.8 8.9 - 10.9 mg/dL   GFR, Estimated 46 (L) >60 mL/min    Comment: (NOTE) Calculated using the CKD-EPI Creatinine Equation (2021)    Anion gap 9 5 - 15    Comment: Performed at [facility], [address] [place], [place] [address]  Brain natriuretic peptide     Status: Abnormal   Collection Time: 04/15/23  9:39 PM  Result Value Ref Range   B Natriuretic Peptide 365.7 (H) 0.0 - 100.0 pg/mL    Comment: Performed at [facility], [address] [place], [place] [address]  Troponin I (High Sensitivity)     Status: None   Collection Time: 04/15/23  9:39 PM  Result Value Ref Range   Troponin I (High Sensitivity) 12 <18 ng/L    Comment: (NOTE) Elevated high sensitivity troponin I (hsTnI) values and significant  changes across serial measurements may suggest ACS but many other  chronic and acute conditions are known to elevate hsTnI results.  Refer to the "Links" section for chest pain algorithms and additional  guidance. Performed at [facility], [address], [place], [place] [address]   Troponin I (High Sensitivity)     Status: None   Collection Time: 04/16/23 12:27 AM  Result Value Ref Range   Troponin I (High Sensitivity) 12 <18 ng/L    Comment: (NOTE) Elevated high sensitivity  troponin I (hsTnI) values and significant  changes across serial measurements may suggest ACS but many other  chronic and acute conditions are known to elevate hsTnI results.  Refer to the "Links" section for chest pain algorithms and additional  guidance. Performed at [facility], [address], [place], [place] [address]    VAS [place] LOWER EXTREMITY  VENOUS (DVT)  Result Date: 04/16/2023  Lower Venous DVT Study Patient Name:  Laurie Fox  Date of Exam:   04/16/2023 Medical Rec #: [id number]         Accession #:    [id number] Date of Birth: Dec 25, 1931         Patient Gender: F Patient Age:   58 years Exam Location:  [facility] Procedure:      VAS [place] LOWER EXTREMITY VENOUS (DVT) Referring Phys: Dow Adolph --------------------------------------------------------------------------------  Indications: Swelling.  Risk Factors: None identified. Limitations: Poor ultrasound/tissue interface. Comparison Study: No prior studies. Performing Technologist: Chanda Busing RVT  Examination Guidelines: A complete evaluation includes B-mode imaging, spectral Doppler, color Doppler, and power Doppler as needed of all accessible portions of each vessel. Bilateral testing is considered an integral part of a complete examination. Limited examinations for reoccurring indications may be performed as noted. The reflux portion of the exam is performed with the patient in reverse Trendelenburg.  +---------+---------------+---------+-----------+----------+--------------+ RIGHT    CompressibilityPhasicitySpontaneityPropertiesThrombus Aging +---------+---------------+---------+-----------+----------+--------------+ CFV      Full           Yes      Yes                                 +---------+---------------+---------+-----------+----------+--------------+ SFJ      Full                                                         +---------+---------------+---------+-----------+----------+--------------+ FV Prox  Full                                                        +---------+---------------+---------+-----------+----------+--------------+ FV Mid                  Yes      Yes                                 +---------+---------------+---------+-----------+----------+--------------+ FV Distal               Yes      Yes                                 +---------+---------------+---------+-----------+----------+--------------+ PFV      Full                                                        +---------+---------------+---------+-----------+----------+--------------+ POP      Full           Yes      Yes                                 +---------+---------------+---------+-----------+----------+--------------+ PTV      Full                                                        +---------+---------------+---------+-----------+----------+--------------+  PERO     Full                                                        +---------+---------------+---------+-----------+----------+--------------+   +---------+---------------+---------+-----------+----------+-------------------+ LEFT     CompressibilityPhasicitySpontaneityPropertiesThrombus Aging      +---------+---------------+---------+-----------+----------+-------------------+ CFV      Full           Yes      Yes                                      +---------+---------------+---------+-----------+----------+-------------------+ SFJ      Full                                                             +---------+---------------+---------+-----------+----------+-------------------+ FV Prox  Full                                                             +---------+---------------+---------+-----------+----------+-------------------+ FV Mid   Full                                                              +---------+---------------+---------+-----------+----------+-------------------+ FV Distal               Yes      Yes                                      +---------+---------------+---------+-----------+----------+-------------------+ PFV      Full                                                             +---------+---------------+---------+-----------+----------+-------------------+ POP      Full           Yes      Yes                                      +---------+---------------+---------+-----------+----------+-------------------+ PTV      Full                                                             +---------+---------------+---------+-----------+----------+-------------------+ PERO  Not well visualized +---------+---------------+---------+-----------+----------+-------------------+    Summary: RIGHT: - There is no evidence of deep vein thrombosis in the lower extremity. However, portions of this examination were limited- see technologist comments above.  - No cystic structure found in the popliteal fossa.  LEFT: - There is no evidence of deep vein thrombosis in the lower extremity. However, portions of this examination were limited- see technologist comments above.  - No cystic structure found in the popliteal fossa.  *See table(s) above for measurements and observations.    Preliminary    DG Chest Portable 1 View  Result Date: 04/15/2023 CLINICAL DATA:  Shortness of breath and lower extremity edema. EXAM: PORTABLE CHEST 1 VIEW COMPARISON:  April 05, 2023 FINDINGS: There is persistent mild to moderate severity cardiac silhouette enlargement. Marked severity calcification of the aortic arch is seen. Low lung volumes are noted with mild areas of bibasilar atelectasis. Small bilateral pleural effusions are seen. Radiopaque surgical clips are seen along the lateral aspect of the mid right chest wall. Multilevel  degenerative changes are seen throughout the thoracic spine. IMPRESSION: Low lung volumes with mild bibasilar atelectasis and small bilateral pleural effusions. Electronically Signed   By: Aram Candela M.D.   On: 04/15/2023 23:50    Pending Labs Unresulted Labs (From admission, onward)     Start     Ordered   04/17/23 0500  CBC  Tomorrow morning,   R       Placed in "And" Linked Group   04/16/23 0749   04/17/23 0500  Comprehensive metabolic panel  Tomorrow morning,   R       Placed in "And" Linked Group   04/16/23 0749   04/16/23 0720  CBC  Once,   R        04/16/23 0719   04/16/23 0720  Comprehensive metabolic panel  Once,   R        04/16/23 0719   04/16/23 0720  Magnesium  Once,   R        04/16/23 0719   04/16/23 0720  Phosphorus  Once,   R        04/16/23 0719   04/16/23 0720  TSH  Once,   R        04/16/23 0719            Vitals/Pain Today's Vitals   04/16/23 0900 04/16/23 0915 04/16/23 1000 04/16/23 1015  BP: (!) 154/87 (!) 160/81 (!) 165/87 138/82  Pulse:  (!) 125 (!) 123 (!) 136  Resp: (!) 32 13 (!) 24 17  Temp:      TempSrc:      SpO2:  95% 96% 99%  Weight:      Height:      PainSc:        Isolation Precautions No active isolations  Medications Medications  furosemide (LASIX) injection 40 mg (40 mg Intravenous Given 04/16/23 0737)  potassium chloride SA (KLOR-CON M) CR tablet 20 mEq (20 mEq Oral Given 04/16/23 0731)  famotidine (PEPCID) tablet 20 mg (20 mg Oral Given 04/16/23 1034)  simvastatin (ZOCOR) tablet 20 mg (has no administration in time range)  anastrozole (ARIMIDEX) tablet 1 mg (1 mg Oral Given 04/16/23 1034)  isosorbide mononitrate (IMDUR) 24 hr tablet 30 mg (30 mg Oral Given 04/16/23 1035)  acetaminophen (TYLENOL) tablet 650 mg (has no administration in time range)  prochlorperazine (COMPAZINE) injection 5 mg (has no administration in time range)  melatonin tablet 5 mg (has no administration in time  range)  polyethylene glycol (MIRALAX /  GLYCOLAX) packet 17 g (has no administration in time range)  diltiazem (CARDIZEM) 1 mg/mL load via infusion 15 mg (15 mg Intravenous Bolus from Bag 04/16/23 0726)    And  diltiazem (CARDIZEM) 125 mg in dextrose 5% 125 mL (1 mg/mL) infusion (15 mg/hr Intravenous Rate/Dose Change 04/16/23 0830)  enoxaparin (LOVENOX) injection 70 mg (70 mg Subcutaneous Given 04/16/23 1031)  aspirin chewable tablet 81 mg (81 mg Oral Given 04/16/23 1031)  hydrALAZINE (APRESOLINE) tablet 100 mg (100 mg Oral Given 04/16/23 0125)  furosemide (LASIX) injection 40 mg (40 mg Intravenous Given 04/16/23 0030)    Mobility walks with person assist

## 2023-04-16 NOTE — Progress Notes (Signed)
PROGRESS NOTE  Laurie Fox Laurie Fox:[id number] DOB: Mar 07, 1932   PCP: Shireen Quan, DO  Patient is from: ALF  DOA: 04/15/2023 LOS: 0  Chief complaints Chief Complaint  Patient presents with   Edema     Brief Narrative / Interim history: 89 year old F with PMH of diastolic CHF,  DM-2, CAD, HTN, HLD, CKD-3B, prolonged QT, remote breast cancer and recent hospitalization from 8/27-9/3 with syncope, uncontrolled hypertension and respiratory failure when she was discharged on 2 L returning with bilateral lower extremity edema and some shortness of breath, and admitted with working diagnosis of acute on chronic diastolic CHF.  BNP was elevated to 365.  Blood pressure elevated as well.  CXR with small bilateral pleural effusion.  Started on IV Lasix and admitted.  Patient went into A-fib with RVR later in the morning.  Started on Cardizem drip and Lovenox injection.    Subjective: Seen and examined earlier this morning.  Patient went into A-fib with RVR with HR to 140s.  She reports "some" shortness of breath and dizziness.  Denies chest pain, GI or UTI symptoms.  Grandson and great-grandson at bedside.  Discussed CODE STATUS including pros and cons of CPR and intubation.  Likes to remain full code.  Objective: Vitals:   04/16/23 0915 04/16/23 1000 04/16/23 1015 04/16/23 1129  BP: (!) 160/81 (!) 165/87 138/82 (!) 141/81  Pulse: (!) 125 (!) 123 (!) 136 (!) 122  Resp: 13 (!) 24 17 (!) 24  Temp:    98.6 F (37 C)  TempSrc:    Oral  SpO2: 95% 96% 99% 95%  Weight:      Height:        Examination:  GENERAL: No apparent distress.  Nontoxic. HEENT: MMM.  Vision and hearing grossly intact.  NECK: Supple.  No apparent JVD.  RESP:  No IWOB.  Fair aeration bilaterally. CVS: Irregular rhythm.  HR in 120s.  Heart sounds normal.  ABD/GI/GU: BS+. Abd soft, NTND.  MSK/EXT:  Moves extremities. No apparent deformity.  1+ BLE edema. SKIN: Erythema in both legs.  No increased warmth to touch. NEURO:  Awake, alert and oriented appropriately.  No apparent focal neuro deficit. PSYCH: Calm. Normal affect.   Procedures:  None  Microbiology summarized: None  Assessment and plan: Principal Problem:   Acute on chronic diastolic (congestive) heart failure (HCC) Active Problems:   HTN (hypertension)   Type 2 diabetes mellitus with chronic kidney disease, without long-term current use of insulin (HCC)   New onset atrial fibrillation (HCC)   Chronic kidney disease, stage 3b (HCC)  Acute on chronic diastolic CHF/pulmonary hypertension: Presents with BLE edema and some shortness of breath.  BNP 365 but lower than prior values.  TTE on 8/16 with LVEF of 55 to 60%, G2 DD, severe LAE, moderate RAE and RVSP of 71.2 mmHg.  Reports compliance with Lasix.  Edema could be due to [facility] and amlodipine.  Does not seem to have significant respiratory distress.  Started on IV Lasix.  Had 2 L urine output so far.  Creatinine stable. -Continue IV Lasix 40 mg twice daily -Strict intake and output, daily weight, renal functions, electrolytes  New onset A-fib with RVR: Went into A-fib with RVR to 140s.  No prior history.  CHA2DS2-VASc score > 5. -Start Cardizem drip -Subcu Lovenox for anticoagulation -Optimize electrolytes -Will involve cardiology if RVR does not improve.   Bilateral lower extremity edema and pain: Likely due to #1.  Also on amlodipine which could contribute.  She  also have some anemia.  Albumin 3.4.  Recent TSH normal.  Remote history of breast cancer -Diuretics as above -Follow lower extremity venous Doppler -Encourage leg elevation   CKD-3B: Stable -Continue monitoring while on diuretics   Hyperlipidemia GERD Resume home regimen   Controlled NIDDM-2 with hyperglycemia: A1c 6.7% on 6/6.  Seems to be metformin -Start CBG monitoring subcu insulin -May benefit from SGLT2 inhibitors moving forward.  QTc prolongation -Minimize or avoid QT prolonging drugs -Optimize electrolytes    Physical debility: From ALF. -Fall precaution -PT OT assessment  Advance care planning: Discussed goal of care with focus on CODE STATUS.  Discussed pros and cons of CPR and intubation.  She would like to remain full code.  Patient states she had discussion with her previous PCP who is retired.  -Encouraged ongoing discussion with family and PCP  Body mass index is 27.98 kg/m.           DVT prophylaxis:  On full dose anticoagulation  Code Status: Full code Family Communication: Updated patient's grandson at bedside Level of care: Progressive Status is: Inpatient Remains inpatient appropriate because: Acute CHF, new onset A-fib with RVR   Final disposition: TBD Consultants:  None  60 minutes with more than 50% spent in reviewing records, counseling patient/family and coordinating care.   Sch Meds:  Scheduled Meds:  anastrozole  1 mg Oral Daily   aspirin  81 mg Oral Daily   enoxaparin (LOVENOX) injection  1 mg/kg Subcutaneous Q24H   famotidine  20 mg Oral Daily   furosemide  40 mg Intravenous BID   isosorbide mononitrate  30 mg Oral Daily   potassium chloride  20 mEq Oral BID   simvastatin  20 mg Oral q1800   Continuous Infusions:  diltiazem (CARDIZEM) infusion 15 mg/hr (04/16/23 0830)   PRN Meds:.acetaminophen, melatonin, polyethylene glycol, prochlorperazine  Antimicrobials: Anti-infectives (From admission, onward)    None        I have personally reviewed the following labs and images: CBC: Recent Labs  Lab 04/15/23 1604 04/16/23 0800  WBC 6.4 10.3  NEUTROABS 4.6  --   HGB 8.6* 9.0*  HCT 29.4* 30.3*  MCV 74.8* 73.4*  PLT 251 274   BMP &GFR Recent Labs  Lab 04/15/23 1604 04/16/23 0800  NA 137 138  K 4.4 4.1  CL 98 96*  CO2 30 30  GLUCOSE 133* 203*  BUN 32* 28*  CREATININE 1.15* 1.18*  CALCIUM 9.8 9.7  MG  --  1.9  PHOS  --  2.8   Estimated Creatinine Clearance: 30.1 mL/min (A) (by C-G formula based on SCr of 1.18 mg/dL  (H)). Liver & Pancreas: Recent Labs  Lab 04/16/23 0800  AST 16  ALT 14  ALKPHOS 87  BILITOT 0.8  PROT 6.0*  ALBUMIN 3.4*   No results for input(s): "LIPASE", "AMYLASE" in the last 168 hours. No results for input(s): "AMMONIA" in the last 168 hours. Diabetic: No results for input(s): "HGBA1C" in the last 72 hours. Recent Labs  Lab 04/09/23 1205 04/09/23 1613  GLUCAP 241* 210*   Cardiac Enzymes: No results for input(s): "CKTOTAL", "CKMB", "CKMBINDEX", "TROPONINI" in the last 168 hours. No results for input(s): "PROBNP" in the last 8760 hours. Coagulation Profile: No results for input(s): "INR", "PROTIME" in the last 168 hours. Thyroid Function Tests: No results for input(s): "TSH", "T4TOTAL", "FREET4", "T3FREE", "THYROIDAB" in the last 72 hours. Lipid Profile: No results for input(s): "CHOL", "HDL", "LDLCALC", "TRIG", "CHOLHDL", "LDLDIRECT" in the last 72 hours.  Anemia Panel: No results for input(s): "VITAMINB12", "FOLATE", "FERRITIN", "TIBC", "IRON", "RETICCTPCT" in the last 72 hours. Urine analysis:    Component Value Date/Time   COLORURINE STRAW (A) 04/03/2023 0044   APPEARANCEUR CLEAR 04/03/2023 0044   LABSPEC 1.011 04/03/2023 0044   PHURINE 5.0 04/03/2023 0044   GLUCOSEU NEGATIVE 04/03/2023 0044   HGBUR NEGATIVE 04/03/2023 0044   BILIRUBINUR NEGATIVE 04/03/2023 0044   KETONESUR NEGATIVE 04/03/2023 0044   PROTEINUR NEGATIVE 04/03/2023 0044   NITRITE NEGATIVE 04/03/2023 0044   LEUKOCYTESUR NEGATIVE 04/03/2023 0044   Sepsis Labs: Invalid input(s): "PROCALCITONIN", "LACTICIDVEN"  Microbiology: No results found for this or any previous visit (from the past 240 hour(s)).  Radiology Studies: VAS [place] LOWER EXTREMITY VENOUS (DVT)  Result Date: 04/16/2023  Lower Venous DVT Study Patient Name:  ELDORA WEATHERS  Date of Exam:   04/16/2023 Medical Rec #: [id number]         Accession #:    [id number] Date of Birth: 1931-12-20         Patient Gender: F Patient Age:   63  years Exam Location:  [facility] Procedure:      VAS [place] LOWER EXTREMITY VENOUS (DVT) Referring Phys: Dow Adolph --------------------------------------------------------------------------------  Indications: Swelling.  Risk Factors: None identified. Limitations: Poor ultrasound/tissue interface. Comparison Study: No prior studies. Performing Technologist: Chanda Busing RVT  Examination Guidelines: A complete evaluation includes B-mode imaging, spectral Doppler, color Doppler, and power Doppler as needed of all accessible portions of each vessel. Bilateral testing is considered an integral part of a complete examination. Limited examinations for reoccurring indications may be performed as noted. The reflux portion of the exam is performed with the patient in reverse Trendelenburg.  +---------+---------------+---------+-----------+----------+--------------+ RIGHT    CompressibilityPhasicitySpontaneityPropertiesThrombus Aging +---------+---------------+---------+-----------+----------+--------------+ CFV      Full           Yes      Yes                                 +---------+---------------+---------+-----------+----------+--------------+ SFJ      Full                                                        +---------+---------------+---------+-----------+----------+--------------+ FV Prox  Full                                                        +---------+---------------+---------+-----------+----------+--------------+ FV Mid                  Yes      Yes                                 +---------+---------------+---------+-----------+----------+--------------+ FV Distal               Yes      Yes                                 +---------+---------------+---------+-----------+----------+--------------+ PFV  Full                                                        +---------+---------------+---------+-----------+----------+--------------+ POP       Full           Yes      Yes                                 +---------+---------------+---------+-----------+----------+--------------+ PTV      Full                                                        +---------+---------------+---------+-----------+----------+--------------+ PERO     Full                                                        +---------+---------------+---------+-----------+----------+--------------+   +---------+---------------+---------+-----------+----------+-------------------+ LEFT     CompressibilityPhasicitySpontaneityPropertiesThrombus Aging      +---------+---------------+---------+-----------+----------+-------------------+ CFV      Full           Yes      Yes                                      +---------+---------------+---------+-----------+----------+-------------------+ SFJ      Full                                                             +---------+---------------+---------+-----------+----------+-------------------+ FV Prox  Full                                                             +---------+---------------+---------+-----------+----------+-------------------+ FV Mid   Full                                                             +---------+---------------+---------+-----------+----------+-------------------+ FV Distal               Yes      Yes                                      +---------+---------------+---------+-----------+----------+-------------------+ PFV      Full                                                             +---------+---------------+---------+-----------+----------+-------------------+  POP      Full           Yes      Yes                                      +---------+---------------+---------+-----------+----------+-------------------+ PTV      Full                                                              +---------+---------------+---------+-----------+----------+-------------------+ PERO                                                  Not well visualized +---------+---------------+---------+-----------+----------+-------------------+    Summary: RIGHT: - There is no evidence of deep vein thrombosis in the lower extremity. However, portions of this examination were limited- see technologist comments above.  - No cystic structure found in the popliteal fossa.  LEFT: - There is no evidence of deep vein thrombosis in the lower extremity. However, portions of this examination were limited- see technologist comments above.  - No cystic structure found in the popliteal fossa.  *See table(s) above for measurements and observations.    Preliminary    DG Chest Portable 1 View  Result Date: 04/15/2023 CLINICAL DATA:  Shortness of breath and lower extremity edema. EXAM: PORTABLE CHEST 1 VIEW COMPARISON:  April 05, 2023 FINDINGS: There is persistent mild to moderate severity cardiac silhouette enlargement. Marked severity calcification of the aortic arch is seen. Low lung volumes are noted with mild areas of bibasilar atelectasis. Small bilateral pleural effusions are seen. Radiopaque surgical clips are seen along the lateral aspect of the mid right chest wall. Multilevel degenerative changes are seen throughout the thoracic spine. IMPRESSION: Low lung volumes with mild bibasilar atelectasis and small bilateral pleural effusions. Electronically Signed   By: Aram Candela M.D.   On: 04/15/2023 23:50      Kaymon Denomme T. Shreshta Medley [facility]  If 7PM-7AM, please contact night-coverage [email or web address] 04/16/2023, 11:40 AM

## 2023-04-16 NOTE — Progress Notes (Signed)
ANTICOAGULATION CONSULT NOTE - Initial Consult  Pharmacy Consult for Lovenox Indication: atrial fibrillation  Allergies  Allergen Reactions   Codeine Nausea And Vomiting    Other Reaction(s): dizziness   Fexofenadine     Other Reaction(s): h/a   Loteprednol Etabonate     Other Reaction(s): swelling & dificulty breathing, chest pain & dizziness   Sulfa Antibiotics Rash    Patient Measurements: Height: 5\' 2"  (157.5 cm) Weight: 69.4 kg (153 lb) IBW/kg (Calculated) : 50.1 Heparin Dosing Weight: 64.7 kg  Vital Signs: Temp: 98.7 F (37.1 C) (09/10 0400) Temp Source: Oral (09/10 0400) BP: 139/72 (09/10 0700) Pulse Rate: 140 (09/10 0700)  Labs: Recent Labs    04/15/23 1604 04/15/23 2139 04/16/23 0027  HGB 8.6*  --   --   HCT 29.4*  --   --   PLT 251  --   --   CREATININE 1.15*  --   --   TROPONINIHS  --  12 12    Estimated Creatinine Clearance: 30.9 mL/min (A) (by C-G formula based on SCr of 1.15 mg/dL (H)).   Medical History: Past Medical History:  Diagnosis Date   Allergy    Arthritis    knees, osteo, shoulders,fingers   Breast cancer (HCC) 07/15/13   right breast    Diabetes mellitus without complication (HCC)    type II   GERD (gastroesophageal reflux disease)    History of radiation therapy 09/15/13-10/12/13   right breast   Hypertension    Personal history of radiation therapy    Wears glasses     Medications:  Scheduled:   anastrozole  1 mg Oral Daily   aspirin  81 mg Oral BID WC   famotidine  20 mg Oral Daily   furosemide  40 mg Intravenous BID   isosorbide mononitrate  30 mg Oral Daily   potassium chloride  20 mEq Oral BID   simvastatin  20 mg Oral q1800   Assessment: 89 YO female with a history of chronic diastolic HF presenting to the [facility] ED 9/10 for IV diuresis. Patient now with new onset atrial fibrillation with RVR up to 140s and SPO2 84%. No anticoagulation noted PTA. Pharmacy consulted to dose Lovenox.   Yesterday, 9/9: Hgb 8.6 (~  baseline)--low, stable Plts 251--stable BNP 365 Scr 1.15, CrCl 30.9 ml/min  Given the patient's hgb of 8.6 (low, stable) and borderline CrCl of 30.9 ml/min, will conservatively dose Lovenox.  Goal of Therapy:  Monitor platelets by anticoagulation protocol: Yes   Plan:  Start Lovenox 1mg /kg [place] daily  Monitor renal function and s/sx of bleeding daily F/u lower extremity ultrasound results    Cherylin Mylar, PharmD Clinical Pharmacist  9/10/20247:45 AM

## 2023-04-16 NOTE — Progress Notes (Signed)
PT Cancellation Note  Patient Details Name: Laurie Fox MRN: [id number] DOB: February 24, 1932   Cancelled Treatment:    Reason Eval/Treat Not Completed: Medical issues which prohibited therapy, elevated HR,  Blanchard Kelch PT Acute Rehabilitation Services Office [phone] Weekend [phone]   Rada Hay 04/16/2023, 7:21 AM

## 2023-04-16 NOTE — Evaluation (Signed)
OT Cancellation Note  Patient Details Name: Laurie Fox MRN: [id number] DOB: 08/28/1931   Cancelled Treatment:    Reason Eval/Treat Not Completed: Medical issues which prohibited therapy Patient has elevated HR. OT to continue to follow and check back as schedule will allow.  Rosalio Loud, MS Acute Rehabilitation Department Office# [phone]  04/16/2023, 8:11 AM

## 2023-04-16 NOTE — H&P (Addendum)
History and Physical  Laurie Fox ZOX:[id number] DOB: [birth year] DOA: 04/15/2023  Referring physician: Dr. Madilyn Hook, EDP  PCP: Shireen Quan, DO  Outpatient Specialists: Cardiology. Patient coming from: Assisted living facility.  Chief Complaint: Legs swelling.  HPI: Laurie Fox is a 89 y.o. female with medical history significant for chronic diastolic CHF, type 2 diabetes, hypertension, hyperlipidemia, CKD 3B, presents to the ED due to bilateral lower extremity edema.  Denies any anginal symptoms.  In the ED noted to be significantly volume overloaded.  Recently admitted on 04/02/2023 and discharged on 04/09/2023 due to syncope, hypertensive emergency, and acute hypoxia, was discharged on 2 L nasal cannula to assisted living facility.  Elevated BNP greater than 300, right JVD, with significant peripheral edema, small bilateral pleural effusions seen on chest x-ray.  IV Lasix was initiated in the ED.  TRH, hospitalist service asked to admit for further diuresing.  ED Course: Temperature 98.7.  BP 180/67, pulse 82, respiration rate 19, O2 saturation 93% on 2 L.  Lab studies notable for serum glucose 133, BUN 32, creatinine 1.15, GFR 46.  BNP 365.  Troponin 12, repeat 12.  Review of Systems: Review of systems as noted in the HPI. All other systems reviewed and are negative.   Past Medical History:  Diagnosis Date   Allergy    Arthritis    knees, osteo, shoulders,fingers   Breast cancer (HCC) 07/15/13   right breast    Diabetes mellitus without complication (HCC)    type II   GERD (gastroesophageal reflux disease)    History of radiation therapy 09/15/13-10/12/13   right breast   Hypertension    Personal history of radiation therapy    Wears glasses    Past Surgical History:  Procedure Laterality Date   BREAST BIOPSY Right 07/15/13   bx=mass 1 0'clock, invasive mammary ca, mammary ca in situ   BREAST LUMPECTOMY     BREAST LUMPECTOMY WITH NEEDLE LOCALIZATION AND AXILLARY SENTINEL  LYMPH NODE BX Right 08/11/2013   Procedure: RIGHT BREAST LUMPECTOMY WITH NEEDLE LOCALIZATION AND AXILLARY SENTINEL LYMPH NODE BIOPSY;  Surgeon: Almond Lint, MD;  Location: [facility];  Service: General;  Laterality: Right;   COLONOSCOPY     DILATION AND CURETTAGE OF UTERUS     FRACTURE SURGERY  2006   lt foot   KNEE SURGERY  [phone]   lt and rt knee scopes   TONSILLECTOMY     TOTAL HIP ARTHROPLASTY Right 01/12/2023   Procedure: TOTAL HIP ARTHROPLASTY ANTERIOR APPROACH;  Surgeon: Samson Frederic, MD;  Location: [facility] OR;  Service: Orthopedics;  Laterality: Right;    Social History:  reports that she has never smoked. She does not have any smokeless tobacco history on file. She reports that she does not drink alcohol and does not use drugs.   Allergies  Allergen Reactions   Codeine Nausea And Vomiting    Other Reaction(s): dizziness   Fexofenadine     Other Reaction(s): h/a   Loteprednol Etabonate     Other Reaction(s): swelling & dificulty breathing, chest pain & dizziness   Sulfa Antibiotics Rash    Family History  Problem Relation Age of Onset   Alzheimer's disease Mother    Colon cancer Father       Prior to Admission medications   Medication Sig Start Date End Date Taking? Authorizing Provider  acetaminophen (TYLENOL) 500 MG tablet Take 1,000 mg by mouth every 8 (eight) hours as needed for moderate pain, fever or headache.  [provider]  amLODipine (NORVASC) 10 MG tablet Take 1 tablet (10 mg total) by mouth daily. 04/09/23   Ghimire, Werner Lean, MD  anastrozole (ARIMIDEX) 1 MG tablet TAKE 1 TABLET BY MOUTH  DAILY 10/13/20   Serena Croissant, MD  ASCORBIC ACID PO Take 1 tablet by mouth daily. Vitamin C, unknown strength.    [provider]  aspirin 81 MG chewable tablet Chew 81 mg by mouth 2 (two) times daily with a meal.    [provider]  famotidine (PEPCID) 20 MG tablet Take 20 mg by mouth daily.    [provider]   furosemide (LASIX) 40 MG tablet Take 1 tablet (40 mg total) by mouth daily as needed (swelling). 04/09/23   Ghimire, Werner Lean, MD  hydrALAZINE (APRESOLINE) 100 MG tablet Take 1 tablet (100 mg total) by mouth every 8 (eight) hours. 04/09/23   Ghimire, Werner Lean, MD  irbesartan (AVAPRO) 150 MG tablet Take 1 tablet (150 mg total) by mouth daily. 04/09/23   Ghimire, Werner Lean, MD  isosorbide mononitrate (IMDUR) 30 MG 24 hr tablet Take 1 tablet (30 mg total) by mouth daily. 04/09/23   Ghimire, Werner Lean, MD  melatonin 3 MG TABS tablet Take 3 mg by mouth at bedtime.    [provider]  metFORMIN (GLUCOPHAGE) 1000 MG tablet Take 1,000 mg by mouth 2 (two) times daily. 03/26/23   [provider]  Multiple Vitamins-Minerals (ONE-A-DAY WOMENS 50+) TABS Take 1 tablet by mouth daily in the afternoon.    [provider]  simvastatin (ZOCOR) 20 MG tablet Take 20 mg by mouth daily.    [provider]    Physical Exam: BP (!) 190/60   Pulse 74   Temp (!) 97.1 F (36.2 C) (Oral)   Resp (!) 24   Ht 5\' 2"  (1.575 m)   Wt 69.4 kg   SpO2 93%   BMI 27.98 kg/m   General: 89 y.o. year-old female well developed well nourished in no acute distress.  Alert and oriented x3. Cardiovascular: Regular rate and rhythm with no rubs or gallops.  No thyromegaly or JVD noted.  3+ pitting edema in lower extremities bilaterally. Respiratory: Clear to auscultation with no wheezes or rales. Good inspiratory effort. Abdomen: Soft nontender nondistended with normal bowel sounds x4 quadrants. Muskuloskeletal: No cyanosis or clubbing noted bilaterally Neuro: CN II-XII intact, strength, sensation, reflexes Skin: No ulcerative lesions noted or rashes Psychiatry: Judgement and insight appear normal. Mood is appropriate for condition and setting          Labs on Admission:  Basic Metabolic Panel: Recent Labs  Lab 04/15/23 1604  NA 137  K 4.4  CL 98  CO2 30  GLUCOSE 133*  BUN 32*  CREATININE  1.15*  CALCIUM 9.8   Liver Function Tests: No results for input(s): "AST", "ALT", "ALKPHOS", "BILITOT", "PROT", "ALBUMIN" in the last 168 hours. No results for input(s): "LIPASE", "AMYLASE" in the last 168 hours. No results for input(s): "AMMONIA" in the last 168 hours. CBC: Recent Labs  Lab 04/15/23 1604  WBC 6.4  NEUTROABS 4.6  HGB 8.6*  HCT 29.4*  MCV 74.8*  PLT 251   Cardiac Enzymes: No results for input(s): "CKTOTAL", "CKMB", "CKMBINDEX", "TROPONINI" in the last 168 hours.  BNP (last 3 results) Recent Labs    04/02/23 1901 04/05/23 0813 04/15/23 2139  BNP 1,788.7* 1,068.8* 365.7*    ProBNP (last 3 results) No results for input(s): "PROBNP" in the last 8760 hours.  CBG: Recent Labs  Lab 04/09/23 0853 04/09/23 1205 04/09/23 1613  GLUCAP 162* 241* 210*    Radiological Exams on Admission: DG Chest Portable 1 View  Result Date: 04/15/2023 CLINICAL DATA:  Shortness of breath and lower extremity edema. EXAM: PORTABLE CHEST 1 VIEW COMPARISON:  April 05, 2023 FINDINGS: There is persistent mild to moderate severity cardiac silhouette enlargement. Marked severity calcification of the aortic arch is seen. Low lung volumes are noted with mild areas of bibasilar atelectasis. Small bilateral pleural effusions are seen. Radiopaque surgical clips are seen along the lateral aspect of the mid right chest wall. Multilevel degenerative changes are seen throughout the thoracic spine. IMPRESSION: Low lung volumes with mild bibasilar atelectasis and small bilateral pleural effusions. Electronically Signed   By: Aram Candela M.D.   On: 04/15/2023 23:50    EKG: I independently viewed the EKG done and my findings are as followed: Sinus rhythm rate of 73.  Nonspecific ST-T changes.  QTC 512.  Assessment/Plan Present on Admission:  Acute on chronic diastolic (congestive) heart failure (HCC)  Principal Problem:   Acute on chronic diastolic (congestive) heart failure (HCC)  Acute  on chronic diastolic CHF Presented with BNP greater than 300, right JVD, bilateral pleural effusions seen on chest x-ray, 3+ pitting edema in lower extremities bilaterally. High-sensitivity troponin negative x 2.  Denies any anginal symptoms. Recent 2D echo done on 03/22/2023 revealed LVEF 55 to 60% with grade 2 diastolic dysfunction. IV diuresing started in the ED, continue IV Lasix 40 mg twice daily x 3 doses, KCl p.o. 20 mEq twice daily x 3 doses. Start strict I's and O's and daily weight Maintain MAP greater than 65 and replace electrolytes as indicated  Bilateral lower extremity edema and pain Sedentary lifestyle for the past few weeks Although symmetrical edema, due to sedentary lifestyle will need to rule out DVT Follow bilateral lower extremity Doppler ultrasound  CKD 3B At baseline Monitor urine output  Hyperlipidemia GERD Resume home regimen  Type 2 diabetes with hyperglycemia Last A1c 6.7 on 01/10/2023 Hold off home oral hypoglycemics Start insulin sliding scale  QTc prolongation Avoid QTc prolonging agents Optimize magnesium and potassium levels Monitor on telemetry  Physical debility PT OT assessment Fall precautions.   Time: 75 minutes.   DVT prophylaxis: Subcu Lovenox daily  Code Status: Full code  Family Communication: None at bedside  Disposition Plan: Admitted to progressive care unit  Consults called: None.  Admission status: Inpatient status.   Status is: Inpatient The patient requires at least 2 midnights for further evaluation and treatment of present condition.   Darlin Drop MD [facility] Pager [phone]  If 7PM-7AM, please contact night-coverage [email or web address] Password [email or web address]  04/16/2023, 1:01 AM

## 2023-04-16 NOTE — ED Notes (Signed)
Attempt made to call Dr. Ludwig Lean because pt new presentation for this visit of A. Fib with RVR up to 140s with an SPO2 of 84%. Pt reports she feels fine at this time. Dr. Madilyn Hook ER doctor notified as this nurse unable to get into contact with hospital floor team at this time.

## 2023-04-16 NOTE — ED Notes (Signed)
Called phlebotomy to collect lab work, possible delay in collection.

## 2023-04-16 NOTE — TOC Initial Note (Addendum)
Transition of Care [facility]) - Initial/Assessment Note    Patient Details  Name: Laurie Fox MRN: [id number] Date of Birth: Sep 11, 1931  Transition of Care [facility]) CM/SW Contact:    Lanier Clam, RN Phone Number: 04/16/2023, 3:06 PM  Clinical Narrative:  Confirmed w/Cee Cee tel#[phone]/fax#[phone]-from [facility] for return;ambulates w/rw, some asst,asst w/ADL's, Inep w/feeding;wears eyeglasses,uses home  02-[facility]. Await PT recc.Left vm w/Michael (son).    -3:34p [employer] rep Mitch & Laurie Fox rep Victorino Dike agree for [employer] to provide home 02 if qualifies @ d/c to deliver to [facility] with orders if needed.            Expected Discharge Plan: Assisted Living Barriers to Discharge: Continued Medical Work up   Patient Goals and CMS Choice Patient states their goals for this hospitalization and ongoing recovery are:: Return [facility] CMS Medicare.gov Compare Post Acute Care list provided to:: Patient Represenative (must comment) (Michael(son)) Choice offered to / list presented to : Adult Children [place] ownership interest in [facility].provided to:: Adult Children    Expected Discharge Plan and Services   Discharge Planning Services: CM Consult   Living arrangements for the past 2 months: Assisted Living Facility                                      Prior Living Arrangements/Services Living arrangements for the past 2 months: Assisted Living Facility Lives with:: Facility Resident Patient language and need for interpreter reviewed:: Yes Do you feel safe going back to the place where you live?: Yes      Need for Family Participation in Patient Care: Yes (Comment) Care giver support system in place?: Yes (comment) Current home services: DME (home 02,rw,cane,wlc/) Criminal Activity/Legal Involvement Pertinent to Current Situation/Hospitalization: No - Comment as needed  Activities of Daily Living Home Assistive  Devices/Equipment: Walker (specify type), Oxygen, Shower chair without back, Grab bars in shower ADL Screening (condition at time of admission) Patient's cognitive ability adequate to safely complete daily activities?: Yes Is the patient deaf or have difficulty hearing?: Yes Does the patient have difficulty seeing, even when wearing glasses/contacts?: No Does the patient have difficulty concentrating, remembering, or making decisions?: Yes Patient able to express need for assistance with ADLs?: Yes Does the patient have difficulty dressing or bathing?: No Independently performs ADLs?: Yes (appropriate for developmental age) Does the patient have difficulty walking or climbing stairs?: Yes Weakness of Legs: Both Weakness of Arms/Hands: None  Permission Sought/Granted Permission sought to share information with : Case Manager Permission granted to share information with : Yes, Verbal Permission Granted  Share Information with NAME: Case Manager           Emotional Assessment Appearance:: Appears stated age Attitude/Demeanor/Rapport: Gracious Affect (typically observed): Accepting Orientation: : Oriented to Self, Oriented to Place, Oriented to  Time, Oriented to Situation Alcohol / Substance Use: Not Applicable Psych Involvement: No (comment)  Admission diagnosis:  Acute on chronic diastolic (congestive) heart failure (HCC) [I50.33] Patient Active Problem List   Diagnosis Date Noted   Acute on chronic diastolic (congestive) heart failure (HCC) 04/16/2023   New onset atrial fibrillation (HCC) 04/16/2023   Chronic kidney disease, stage 3b (HCC) 04/16/2023   Pneumonia due to infectious organism 04/03/2023   Near syncope 04/02/2023   Malnutrition of moderate degree 01/12/2023   Closed displaced fracture of right femoral neck (HCC) 01/10/2023  HTN (hypertension) 01/10/2023   Type 2 diabetes mellitus with chronic kidney disease, without long-term current use of insulin (HCC)  01/10/2023   Hip fracture (HCC) 01/10/2023   Breast cancer of upper-inner quadrant of right female breast (HCC) 07/17/2013   PCP:  Shireen Quan, DO Pharmacy:   [id number] - [place], [place] - [address] [place] [address] Phone: [phone] Fax: [phone]  [facility]) - [place], [place] - [address] [facility] [address] [place] [address] [place] [address] Phone: [phone] Fax: [phone]  [facility] - [place], [place] - [address] [place]  [address] Phone: [phone] Fax: [phone]  [facility] - [place], [place] - [place] [address] [place] [address] Phone: [phone] Fax: [phone]     Social Determinants of Health (SDOH) Social History: SDOH Screenings   Food Insecurity: No Food Insecurity (04/16/2023)  Housing: Low Risk  (04/16/2023)  Transportation Needs: No Transportation Needs (04/16/2023)  Utilities: Not At Risk (04/16/2023)  Tobacco Use: Unknown (04/15/2023)   SDOH Interventions:     Readmission Risk Interventions    01/11/2023    2:10 PM  Readmission Risk Prevention Plan  Transportation Screening Complete  PCP or Specialist Appt within 5-7 Days Complete  Home Care Screening Complete  Medication Review (RN CM) Complete

## 2023-04-16 NOTE — ED Notes (Signed)
Message sent to Dr. Alanda Slim regarding pts high HR, and if any further action required at this time. Advised that HR in 120's is okay at this time, and to continue current drip. Pt not complaining of any s/s at this time, and appears stable.

## 2023-04-16 NOTE — Plan of Care (Signed)
  Problem: Education: Goal: Knowledge of the prescribed therapeutic regimen will improve Outcome: Progressing Goal: Understanding of discharge needs will improve Outcome: Progressing   Problem: Pain Management: Goal: Pain level will decrease with appropriate interventions Outcome: Progressing   Problem: Elimination: Goal: Will not experience complications related to urinary retention Outcome: Progressing   Problem: Pain Managment: Goal: General experience of comfort will improve Outcome: Progressing   Problem: Safety: Goal: Ability to remain free from injury will improve Outcome: Progressing

## 2023-04-17 ENCOUNTER — Other Ambulatory Visit: Payer: Self-pay

## 2023-04-17 DIAGNOSIS — I4891 Unspecified atrial fibrillation: Secondary | ICD-10-CM | POA: Diagnosis not present

## 2023-04-17 DIAGNOSIS — N183 Chronic kidney disease, stage 3 unspecified: Secondary | ICD-10-CM

## 2023-04-17 DIAGNOSIS — N1832 Chronic kidney disease, stage 3b: Secondary | ICD-10-CM

## 2023-04-17 DIAGNOSIS — E1122 Type 2 diabetes mellitus with diabetic chronic kidney disease: Secondary | ICD-10-CM | POA: Diagnosis not present

## 2023-04-17 DIAGNOSIS — I5033 Acute on chronic diastolic (congestive) heart failure: Secondary | ICD-10-CM | POA: Diagnosis not present

## 2023-04-17 DIAGNOSIS — I10 Essential (primary) hypertension: Secondary | ICD-10-CM

## 2023-04-17 LAB — GLUCOSE, CAPILLARY
Glucose-Capillary: 144 mg/dL — ABNORMAL HIGH (ref 70–99)
Glucose-Capillary: 142 mg/dL — ABNORMAL HIGH (ref 70–99)
Glucose-Capillary: 196 mg/dL — ABNORMAL HIGH (ref 70–99)
Glucose-Capillary: 169 mg/dL — ABNORMAL HIGH (ref 70–99)

## 2023-04-17 LAB — CBC
HCT: 27.3 % — ABNORMAL LOW (ref 36.0–46.0)
Hemoglobin: 8 g/dL — ABNORMAL LOW (ref 12.0–15.0)
MCH: 21.7 pg — ABNORMAL LOW (ref 26.0–34.0)
MCHC: 29.3 g/dL — ABNORMAL LOW (ref 30.0–36.0)
MCV: 74.2 fL — ABNORMAL LOW (ref 80.0–100.0)
Platelets: 241 10*3/uL (ref 150–400)
RBC: 3.68 MIL/uL — ABNORMAL LOW (ref 3.87–5.11)
RDW: 17.6 % — ABNORMAL HIGH (ref 11.5–15.5)
WBC: 5.9 10*3/uL (ref 4.0–10.5)
nRBC: 0 % (ref 0.0–0.2)

## 2023-04-17 LAB — RENAL FUNCTION PANEL
Albumin: 2.9 g/dL — ABNORMAL LOW (ref 3.5–5.0)
Anion gap: 10 (ref 5–15)
BUN: 25 mg/dL — ABNORMAL HIGH (ref 8–23)
CO2: 30 mmol/L (ref 22–32)
Calcium: 9.4 mg/dL (ref 8.9–10.3)
Chloride: 98 mmol/L (ref 98–111)
Creatinine, Ser: 1.2 mg/dL — ABNORMAL HIGH (ref 0.44–1.00)
GFR, Estimated: 44 mL/min — ABNORMAL LOW (ref >60)
Glucose, Bld: 135 mg/dL — ABNORMAL HIGH (ref 70–99)
Phosphorus: 2.7 mg/dL (ref 2.5–4.6)
Potassium: 3.7 mmol/L (ref 3.5–5.1)
Sodium: 138 mmol/L (ref 135–145)

## 2023-04-17 LAB — T4, FREE: Free T4: 0.87 ng/dL (ref 0.61–1.12)

## 2023-04-17 LAB — MAGNESIUM: Magnesium: 1.8 mg/dL (ref 1.7–2.4)

## 2023-04-17 MED ORDER — FUROSEMIDE 10 MG/ML IJ SOLN
40.0000 mg | Freq: Every day | INTRAMUSCULAR | Status: DC
  Administered 2023-04-18: 40 mg via INTRAVENOUS
  Filled 2023-04-17: qty 4

## 2023-04-17 MED ORDER — SODIUM CHLORIDE 0.9 % IV SOLN
250.0000 mg | Freq: Every day | INTRAVENOUS | Status: AC
  Administered 2023-04-17 – 2023-04-18 (×2): 250 mg via INTRAVENOUS
  Filled 2023-04-17 (×2): qty 20

## 2023-04-17 MED ORDER — METOPROLOL TARTRATE 25 MG PO TABS
25.0000 mg | ORAL_TABLET | Freq: Two times a day (BID) | ORAL | Status: DC
  Administered 2023-04-17 – 2023-04-19 (×5): 25 mg via ORAL
  Filled 2023-04-17 (×5): qty 1

## 2023-04-17 MED ORDER — HYDRALAZINE HCL 20 MG/ML IJ SOLN
5.0000 mg | Freq: Four times a day (QID) | INTRAMUSCULAR | Status: DC | PRN
  Administered 2023-04-17: 5 mg via INTRAVENOUS
  Filled 2023-04-17 (×2): qty 1

## 2023-04-17 MED ORDER — POTASSIUM CHLORIDE CRYS ER 20 MEQ PO TBCR
40.0000 meq | EXTENDED_RELEASE_TABLET | Freq: Once | ORAL | Status: AC
  Administered 2023-04-17: 40 meq via ORAL
  Filled 2023-04-17: qty 4

## 2023-04-17 MED ORDER — MAGNESIUM SULFATE 2 GM/50ML IV SOLN
2.0000 g | Freq: Once | INTRAVENOUS | Status: AC
  Administered 2023-04-17: 2 g via INTRAVENOUS
  Filled 2023-04-17: qty 50

## 2023-04-17 MED ORDER — APIXABAN 5 MG PO TABS
5.0000 mg | ORAL_TABLET | Freq: Two times a day (BID) | ORAL | Status: DC
  Administered 2023-04-18 – 2023-04-19 (×3): 5 mg via ORAL
  Filled 2023-04-17 (×3): qty 1

## 2023-04-17 NOTE — Progress Notes (Signed)
SATURATION QUALIFICATIONS: (This note is used to comply with regulatory documentation for home oxygen)  Patient Saturations on Room Air at Rest = 84%  Patient Saturations on Room Air while Ambulating = N/A  Patient Saturations on 2 Liters of oxygen while Ambulating = 88%  Please briefly explain why patient needs home oxygen: Pt requires oxygen at rest and during ambulation to maintain oxygen saturations greater than 92%.

## 2023-04-17 NOTE — Discharge Instructions (Signed)

## 2023-04-17 NOTE — Progress Notes (Signed)
PROGRESS NOTE  Laurie Fox ZOX:[id number] DOB: Aug 18, 1931   PCP: Shireen Quan, DO  Patient is from: ALF  DOA: 04/15/2023 LOS: 1  Chief complaints Chief Complaint  Patient presents with   Edema     Brief Narrative / Interim history: 89 year old F with PMH of diastolic CHF,  DM-2, CAD, HTN, HLD, CKD-3B, prolonged QT, remote breast cancer and recent hospitalization from 8/27-9/3 with syncope, uncontrolled hypertension and respiratory failure when she was discharged on 2 L returning with bilateral lower extremity edema and some shortness of breath, and admitted with working diagnosis of acute on chronic diastolic CHF.  BNP was elevated to 365.  Blood pressure elevated as well.  CXR with small bilateral pleural effusion.  Started on IV Lasix and admitted.  Patient went into A-fib with RVR later in the morning.  Started on Cardizem drip and Lovenox injection.  Patient converted to sinus rhythm and transition to metoprolol and Eliquis.  Remains on IV Lasix for CHF.    Subjective: Seen and examined earlier this morning.  No major events overnight of this morning.  Converted to sinus rhythm.  HR in low 60s.  No complaints.  Feels better but sleepy.  Patient's daughter at bedside.  Objective: Vitals:   04/17/23 1055 04/17/23 1100 04/17/23 1200 04/17/23 1242  BP:  (!) 155/55 (!) 151/91   Pulse: (!) 58 63 68   Resp: (!) 23 (!) 23 (!) 21   Temp:    97.6 F (36.4 C)  TempSrc:    Axillary  SpO2: 98% 98% 97%   Weight:      Height:        Examination:  GENERAL: No apparent distress.  Nontoxic. HEENT: MMM.  Vision and hearing grossly intact.  NECK: Supple.  No apparent JVD.  RESP:  No IWOB.  Fair aeration bilaterally. CVS: RRR.  Heart sounds normal.  ABD/GI/GU: BS+. Abd soft, NTND.  MSK/EXT:  Moves extremities. No apparent deformity.  Trace BLE edema. SKIN: Erythema in both legs.  No increased warmth to touch. NEURO: Sleepy but wakes to voice.  Fairly oriented.  No apparent focal  neuro deficit. PSYCH: Calm. Normal affect.   Procedures:  None  Microbiology summarized: None  Assessment and plan: Principal Problem:   Acute on chronic diastolic (congestive) heart failure (HCC) Active Problems:   HTN (hypertension)   Type 2 diabetes mellitus with chronic kidney disease, without long-term current use of insulin (HCC)   New onset atrial fibrillation (HCC)   Chronic kidney disease, stage 3b (HCC)  Acute on chronic diastolic CHF/PAH: Presents with BLE edema and some shortness of breath.  BNP 365 but lower than prior values.  TTE on 8/16 with LVEF of 55 to 60%, G2 DD, severe LAE, moderate RAE and RVSP of 71.2 mmHg.  Started on IV Lasix with significant improvement.  About 1.6 L UOP. -Decrease IV Lasix to 40 mg daily -Continue home Jardiance. -Strict intake and output, daily weight, renal functions, electrolytes  New onset A-fib with RVR: Was in RVR to 140s in ED.  CHA2DS2-VASc score > 5.  Now in NSR. -Transition to p.o. metoprolol and Eliquis. -Discontinue low-dose aspirin. -Optimize electrolytes   Controlled NIDDM-2 with hyperglycemia: A1c 6.7% on 6/6.  Seems to be metformin Recent Labs  Lab 04/16/23 1704 04/16/23 2041 04/17/23 0734 04/17/23 1152  GLUCAP 282* 172* 144* 142*  -Continue home Jardiance -Continue current insulin regimen  Anemia of renal disease: H&H relatively stable.  Anemia panel with iron deficiency Recent Labs  01/10/23 1347 01/12/23 0351 01/13/23 0325 01/14/23 0819 01/15/23 4098 04/02/23 1901 04/03/23 0212 04/15/23 1604 04/16/23 0800 04/17/23 0522  HGB 11.4* 10.1* 8.5* 8.4* 8.1* 8.6* 8.8* 8.6* 9.0* 8.0*  -IV ferric gluconate 250 mg x 1 -Monitor  Bilateral lower extremity edema and pain: Likely due to CHF and amlodipine.  Venous Doppler negative. -Diuretics as above -Encourage leg elevation -Discontinue amlodipine   CKD-3B: Stable -Continue monitoring while on diuretics   Hyperlipidemia GERD -Continue home  meds.  QTc prolongation -Minimize or avoid QT prolonging drugs -Optimize electrolytes   Physical debility: From ALF. -Fall precaution -PT OT assessment  History of breast cancer: Noted. -Continue anastrozole  Advance care planning: Discussed goal of care with focus on CODE STATUS.  Discussed pros and cons of CPR and intubation.  Patient's daughter in agreement with DNR and DNI but would like to discuss more with patient.  Patient is not quite alert this morning. -Consulted palliative care to facilitate conversation  Obesity Body mass index is 30.36 kg/m.           DVT prophylaxis:  On full dose anticoagulation  Code Status: Full code Family Communication: Updated patient's daughter at bedside. Level of care: Progressive Status is: Inpatient Remains inpatient appropriate because: Acute CHF, new onset A-fib with RVR   Final disposition: TBD Consultants:  Palliative medicine  55 minutes with more than 50% spent in reviewing records, counseling patient/family and coordinating care.   Sch Meds:  Scheduled Meds:  anastrozole  1 mg Oral Daily   empagliflozin  10 mg Oral Daily   enoxaparin (LOVENOX) injection  1 mg/kg Subcutaneous Q24H   famotidine  20 mg Oral Daily   [START ON 04/18/2023] furosemide  40 mg Intravenous Daily   insulin aspart  0-5 Units Subcutaneous QHS   insulin aspart  0-9 Units Subcutaneous TID WC   isosorbide mononitrate  30 mg Oral Daily   metoprolol tartrate  25 mg Oral BID   simvastatin  20 mg Oral q1800   Continuous Infusions:  ferric gluconate (FERRLECIT) IVPB 250 mg (04/17/23 1053)   PRN Meds:.acetaminophen, melatonin, polyethylene glycol, prochlorperazine  Antimicrobials: Anti-infectives (From admission, onward)    None        I have personally reviewed the following labs and images: CBC: Recent Labs  Lab 04/15/23 1604 04/16/23 0800 04/17/23 0522  WBC 6.4 10.3 5.9  NEUTROABS 4.6  --   --   HGB 8.6* 9.0* 8.0*  HCT 29.4*  30.3* 27.3*  MCV 74.8* 73.4* 74.2*  PLT 251 274 241   BMP &GFR Recent Labs  Lab 04/15/23 1604 04/16/23 0800 04/17/23 0522  NA 137 138 138  K 4.4 4.1 3.7  CL 98 96* 98  CO2 30 30 30   GLUCOSE 133* 203* 135*  BUN 32* 28* 25*  CREATININE 1.15* 1.18* 1.20*  CALCIUM 9.8 9.7 9.4  MG  --  1.9 1.8  PHOS  --  2.8 2.7   Estimated Creatinine Clearance: 30.8 mL/min (A) (by C-G formula based on SCr of 1.2 mg/dL (H)). Liver & Pancreas: Recent Labs  Lab 04/16/23 0800 04/17/23 0522  AST 16  --   ALT 14  --   ALKPHOS 87  --   BILITOT 0.8  --   PROT 6.0*  --   ALBUMIN 3.4* 2.9*   No results for input(s): "LIPASE", "AMYLASE" in the last 168 hours. No results for input(s): "AMMONIA" in the last 168 hours. Diabetic: No results for input(s): "HGBA1C" in the last 72 hours.  Recent Labs  Lab 04/16/23 1704 04/16/23 2041 04/17/23 0734 04/17/23 1152  GLUCAP 282* 172* 144* 142*   Cardiac Enzymes: No results for input(s): "CKTOTAL", "CKMB", "CKMBINDEX", "TROPONINI" in the last 168 hours. No results for input(s): "PROBNP" in the last 8760 hours. Coagulation Profile: No results for input(s): "INR", "PROTIME" in the last 168 hours. Thyroid Function Tests: Recent Labs    04/16/23 0800  TSH 14.420*   Lipid Profile: No results for input(s): "CHOL", "HDL", "LDLCALC", "TRIG", "CHOLHDL", "LDLDIRECT" in the last 72 hours. Anemia Panel: Recent Labs    04/16/23 1107  VITAMINB12 346  FOLATE 23.8  FERRITIN 22  TIBC 398  IRON 33  RETICCTPCT 1.5   Urine analysis:    Component Value Date/Time   COLORURINE STRAW (A) 04/03/2023 0044   APPEARANCEUR CLEAR 04/03/2023 0044   LABSPEC 1.011 04/03/2023 0044   PHURINE 5.0 04/03/2023 0044   GLUCOSEU NEGATIVE 04/03/2023 0044   HGBUR NEGATIVE 04/03/2023 0044   BILIRUBINUR NEGATIVE 04/03/2023 0044   KETONESUR NEGATIVE 04/03/2023 0044   PROTEINUR NEGATIVE 04/03/2023 0044   NITRITE NEGATIVE 04/03/2023 0044   LEUKOCYTESUR NEGATIVE 04/03/2023 0044    Sepsis Labs: Invalid input(s): "PROCALCITONIN", "LACTICIDVEN"  Microbiology: No results found for this or any previous visit (from the past 240 hour(s)).  Radiology Studies: No results found.    Jamyrah Saur T. Rayanne Padmanabhan [facility]  If 7PM-7AM, please contact night-coverage [email or web address] 04/17/2023, 2:22 PM

## 2023-04-17 NOTE — Evaluation (Signed)
Occupational Therapy Evaluation Patient Details Name: Laurie Fox MRN: [id number] DOB: September 20, 1931 Today's Date: 04/17/2023   History of Present Illness 89 yr old female admitted with acute on chronic HF, new onset Afib with RVR, syncope. Hx of CHF, DM, CAD, CKD 3, breast CA   Clinical Impression   The pt is currently presenting with the below listed deficits which compromise her ADL performance and overall functional independence (see OT problem list). She reported feelings of intermittent dizziness, as well as mild shortness of breath with activity. She is currently on 2L O2 via nasal cannula. She also appeared to be with general deconditioning, though not severe. She will benefit from further OT services to maximize her independence with self-care tasks & to facilitate her safe return to her ALF.       If plan is discharge home, recommend the following: A little help with bathing/dressing/bathroom;Assistance with cooking/housework;Direct supervision/assist for medications management;Assist for transportation    Functional Status Assessment  Patient has had a recent decline in their functional status and demonstrates the ability to make significant improvements in function in a reasonable and predictable amount of time.  Equipment Recommendations  None recommended by OT    Recommendations for Other Services       Precautions / Restrictions Precautions Precautions: Fall Precaution Comments: monitor O2 Restrictions Weight Bearing Restrictions: No      Mobility Bed Mobility Overal bed mobility: Needs Assistance Bed Mobility: Supine to Sit, Sit to Supine     Supine to sit: Supervision, HOB elevated, Used rails Sit to supine: Supervision        Transfers Overall transfer level: Needs assistance Equipment used: Rolling walker (2 wheels) Transfers: Sit to/from Stand Sit to Stand: Contact guard assist                  Balance     Sitting balance-Leahy Scale:  Good         Standing balance comment: CGA with RW                           ADL either performed or assessed with clinical judgement   ADL Overall ADL's : Needs assistance/impaired Eating/Feeding: Independent;Sitting   Grooming: Set up;Sitting Grooming Details (indicate cue type and reason): She performed teeth brushing seated EOB.         Upper Body Dressing : Set up;Sitting   Lower Body Dressing: Minimal assistance;Sit to/from stand   Toilet Transfer: Contact guard assist;Minimal assistance;Grab bars;Ambulation;Rolling walker (2 wheels) Toilet Transfer Details (indicate cue type and reason): at bathroom level, based on clinical judgement                 Vision Baseline Vision/History: 1 Wears glasses              Pertinent Vitals/Pain Pain Assessment Pain Assessment: No/denies pain     Extremity/Trunk Assessment Upper Extremity Assessment Upper Extremity Assessment: Overall WFL for tasks assessed   Lower Extremity Assessment Lower Extremity Assessment: Generalized weakness      Communication Communication Communication: No apparent difficulties   Cognition Arousal: Alert Behavior During Therapy: WFL for tasks assessed/performed Overall Cognitive Status: Within Functional Limits for tasks assessed                     Home Living Family/patient expects to be discharged to:: Assisted living         Home Equipment: Agricultural consultant  Prior Functioning/Environment Prior Level of Function : Independent              Mobility Comments: RW used for ambulation-walks to dining room ADLs Comments: Pt reports independence with ADLs.       OT Problem List: Decreased strength;Impaired balance (sitting and/or standing);Decreased activity tolerance;Decreased knowledge of use of DME or AE;Cardiopulmonary status limiting activity      OT Treatment/Interventions: Self-care/ADL training;DME and/or AE instruction;Therapeutic  activities;Balance training;Therapeutic exercise;Patient/family education;Energy conservation    OT Goals(Current goals can be found in the care plan section) Acute Rehab OT Goals OT Goal Formulation: With patient Time For Goal Achievement: 05/01/23 Potential to Achieve Goals: Good ADL Goals Pt Will Perform Grooming: with modified independence;standing Pt Will Perform Lower Body Dressing: with modified independence;sit to/from stand Pt Will Transfer to Toilet: with modified independence;ambulating;grab bars Pt Will Perform Toileting - Clothing Manipulation and hygiene: with modified independence;sit to/from stand  OT Frequency: Min 1X/week       AM-PAC OT "6 Clicks" Daily Activity     Outcome Measure Help from another person eating meals?: None Help from another person taking care of personal grooming?: A Little Help from another person toileting, which includes using toliet, bedpan, or urinal?: A Little Help from another person bathing (including washing, rinsing, drying)?: A Little Help from another person to put on and taking off regular upper body clothing?: A Little Help from another person to put on and taking off regular lower body clothing?: A Little 6 Click Score: 19   End of Session Equipment Utilized During Treatment: Rolling walker (2 wheels);Oxygen Nurse Communication: Mobility status  Activity Tolerance: Patient tolerated treatment well Patient left: in bed;with call bell/phone within reach;with bed alarm set  OT Visit Diagnosis: Unsteadiness on feet (R26.81);Muscle weakness (generalized) (M62.81)                Time: 1310-1336 OT Time Calculation (min): 26 min Charges:  OT General Charges $OT Visit: 1 Visit OT Evaluation $OT Eval Low Complexity: 1 Low    Kamayah Pillay L Jordany Russett, OTR/L 04/17/2023, 1:58 PM

## 2023-04-17 NOTE — Evaluation (Signed)
Physical Therapy Evaluation Patient Details Name: Laurie Fox MRN: [id number] DOB: 1931-10-17 Today's Date: 04/17/2023  History of Present Illness  89 yo female admitted with acute on chronic HF, Afib with RVR, syncope. Hx of CHF, DM, CAD, CKD, breast Ca, R THA 01/2023  Clinical Impression  On eval, pt required CGA for mobility. She walked ~75 feet with a RW. O2: 84% on RA at rest, 88% on 2L [place] O2 with ambulation. Pt tolerated activity well. She reports she was wearing O2 at ALF prior to this admission. Recommend return to ALF with HHPT f/u. Recommend daily ambulation in hallway with nursing or mobility team.       If plan is discharge home, recommend the following: Assistance with cooking/housework;Direct supervision/assist for medications management;Assist for transportation;Help with stairs or ramp for entrance;Direct supervision/assist for financial management;Two people to help with bathing/dressing/bathroom;A little help with walking and/or transfers;A little help with bathing/dressing/bathroom;Supervision due to cognitive status   Can travel by private vehicle        Equipment Recommendations None recommended by PT  Recommendations for Other Services  OT consult    Functional Status Assessment Patient has had a recent decline in their functional status and demonstrates the ability to make significant improvements in function in a reasonable and predictable amount of time.     Precautions / Restrictions Precautions Precautions: Fall Precaution Comments: monitor O2 Restrictions Weight Bearing Restrictions: No      Mobility  Bed Mobility Overal bed mobility: Needs Assistance Bed Mobility: Supine to Sit     Supine to sit: Supervision, HOB elevated     General bed mobility comments: Supv for safety, multiple lines.    Transfers Overall transfer level: Needs assistance Equipment used: Rolling walker (2 wheels) Transfers: Sit to/from Stand Sit to Stand: Contact  guard assist           General transfer comment: Cues for safety, hand placement. Increased time.    Ambulation/Gait Ambulation/Gait assistance: Contact guard assist Gait Distance (Feet): 75 Feet Assistive device: Rolling walker (2 wheels) Gait Pattern/deviations: Step-through pattern, Decreased stride length, Trunk flexed       General Gait Details: Cues for posture, proper use of RW. Pt uses preferred hand placement on walker-aware of proper hand placement. O2 88% 2L. Dyspnea 2/4  Stairs            Wheelchair Mobility     Tilt Bed    Modified Rankin (Stroke Patients Only)       Balance Overall balance assessment: Needs assistance, History of Falls         Standing balance support: During functional activity, Bilateral upper extremity supported, Reliant on assistive device for balance Standing balance-Leahy Scale: Poor                               Pertinent Vitals/Pain Pain Assessment Pain Assessment: No/denies pain    Home Living Family/patient expects to be discharged to:: Assisted living                 Home Equipment: Rolling Walker (2 wheels);Grab bars - tub/shower;Shower seat      Prior Function Prior Level of Function : Needs assist             Mobility Comments: RW for ambulation-walks to dining room ADLs Comments: Pt reports independence with ADLs, however states she sits for showering and leans to the side to wash bottom  Extremity/Trunk Assessment   Upper Extremity Assessment Upper Extremity Assessment: Defer to OT evaluation    Lower Extremity Assessment Lower Extremity Assessment: Generalized weakness    Cervical / Trunk Assessment Cervical / Trunk Assessment: Kyphotic  Communication      Cognition Arousal: Alert Behavior During Therapy: WFL for tasks assessed/performed Overall Cognitive Status: Within Functional Limits for tasks assessed                                           General Comments      Exercises     Assessment/Plan    PT Assessment Patient needs continued PT services  PT Problem List Decreased strength;Decreased range of motion;Decreased balance;Decreased mobility;Decreased coordination;Decreased knowledge of use of DME;Decreased safety awareness;Decreased knowledge of precautions       PT Treatment Interventions DME instruction;Gait training;Functional mobility training;Therapeutic activities;Therapeutic exercise;Balance training;Neuromuscular re-education;Patient/family education    PT Goals (Current goals can be found in the Care Plan section)  Acute Rehab PT Goals PT Goal Formulation: With patient Time For Goal Achievement: 05/01/23 Potential to Achieve Goals: Good    Frequency Min 1X/week     Co-evaluation               AM-PAC PT "6 Clicks" Mobility  Outcome Measure Help needed turning from your back to your side while in a flat bed without using bedrails?: A Little Help needed moving from lying on your back to sitting on the side of a flat bed without using bedrails?: A Little Help needed moving to and from a bed to a chair (including a wheelchair)?: A Little Help needed standing up from a chair using your arms (e.g., wheelchair or bedside chair)?: A Little Help needed to walk in hospital room?: A Little Help needed climbing 3-5 steps with a railing? : Total 6 Click Score: 16    End of Session Equipment Utilized During Treatment: Oxygen;Gait belt Activity Tolerance: Patient tolerated treatment well Patient left: in bed;with call bell/phone within reach (sitting EOB)   PT Visit Diagnosis: Unsteadiness on feet (R26.81);Difficulty in walking, not elsewhere classified (R26.2);Muscle weakness (generalized) (M62.81)    Time: 9528-4132 PT Time Calculation (min) (ACUTE ONLY): 35 min   Charges:   PT Evaluation $PT Eval Low Complexity: 1 Low PT Treatments $Gait Training: 8-22 mins PT General Charges $$ ACUTE PT  VISIT: 1 Visit            Faye Ramsay, PT Acute Rehabilitation  Office: [phone]

## 2023-04-17 NOTE — Progress Notes (Signed)
ANTICOAGULATION CONSULT NOTE - Follow Up  Pharmacy Consult for Lovenox > apixaban Indication: atrial fibrillation  Allergies  Allergen Reactions   Codeine Nausea And Vomiting    Other Reaction(s): dizziness   Fexofenadine     Other Reaction(s): h/a   Loteprednol Etabonate     Other Reaction(s): swelling & dificulty breathing, chest pain & dizziness   Sulfa Antibiotics Rash    Patient Measurements: Height: 5\' 2"  (157.5 cm) Weight: 75.3 kg (166 lb 0.1 oz) IBW/kg (Calculated) : 50.1  Vital Signs: Temp: 97.6 F (36.4 C) (09/11 1242) Temp Source: Axillary (09/11 1242) BP: 174/60 (09/11 1408) Pulse Rate: 65 (09/11 1408)  Labs: Recent Labs    04/15/23 1604 04/15/23 2139 04/16/23 0027 04/16/23 0800 04/17/23 0522  HGB 8.6*  --   --  9.0* 8.0*  HCT 29.4*  --   --  30.3* 27.3*  PLT 251  --   --  274 241  CREATININE 1.15*  --   --  1.18* 1.20*  TROPONINIHS  --  12 12  --   --     Estimated Creatinine Clearance: 30.8 mL/min (A) (by C-G formula based on SCr of 1.2 mg/dL (H)).   Medical History: Past Medical History:  Diagnosis Date   Allergy    Arthritis    knees, osteo, shoulders,fingers   Breast cancer (HCC) 07/15/13   right breast    Diabetes mellitus without complication (HCC)    type II   GERD (gastroesophageal reflux disease)    History of radiation therapy 09/15/13-10/12/13   right breast   Hypertension    Personal history of radiation therapy    Wears glasses     Medications: No anticoagulants PTA  Assessment: 89 YO female with a history of chronic diastolic HF presenting to the [facility] ED 9/10 for IV diuresis. Patient now with new onset atrial fibrillation with RVR up to 140s and SPO2 84%. No anticoagulation noted PTA. Venous dopplers negative.   Pt started on therapeutic enoxaparin on admission, pharmacy now consulted to transition to apixaban.   Today, 04/17/23 CBC: Hgb low and decreased, Plt WNL Age: 89, SCr 1.2, total body weight 75 kg  Pt currently  anticoagulated with enoxaparin 1 mg/kg subQ q24h. Last dose: 04/17/23 @ 0814   Plan:  Discontinue enoxaparin. Start apixaban 24 hours after last dose of enoxaparin Apixaban 5 mg PO BID CBC with AM labs tomorrow  Cindi Carbon, PharmD 04/17/23 2:53 PM

## 2023-04-17 NOTE — TOC Progression Note (Signed)
Transition of Care [facility]) - Progression Note    Patient Details  Name: Laurie Fox MRN: [id number] Date of Birth: 1931-09-19  [facility] of [facility]) CM/SW Contact  Kayden Amend, Olegario Messier, RN Phone Number: 04/17/2023, 3:55 PM  Clinical Narrative: PT recc HHPT;spoke to dtr who has concerns about mother's ability to return needing more help;left vm w/[facility]-ALF rep C.C. about concerns & their HHPT/OT. Await return call.      Expected Discharge Plan: Assisted Living Barriers to Discharge: Continued Medical Work up  Expected Discharge Plan and Services   Discharge Planning Services: CM Consult   Living arrangements for the past 2 months: Assisted Living Facility                                       Social Determinants of Health (SDOH) Interventions SDOH Screenings   Food Insecurity: No Food Insecurity (04/16/2023)  Housing: Low Risk  (04/16/2023)  Transportation Needs: No Transportation Needs (04/16/2023)  Utilities: Not At Risk (04/16/2023)  Tobacco Use: Unknown (04/15/2023)    Readmission Risk Interventions    04/16/2023    3:14 PM 01/11/2023    2:10 PM  Readmission Risk Prevention Plan  Transportation Screening Complete Complete  PCP or Specialist Appt within 5-7 Days  Complete  PCP or Specialist Appt within 3-5 Days Complete   Home Care Screening  Complete  Medication Review (RN CM)  Complete  HRI or Home Care Consult Complete   Social Work Consult for Recovery Care Planning/Counseling Complete   Palliative Care Screening Not Applicable   Medication Review Oceanographer) Complete

## 2023-04-18 DIAGNOSIS — N1832 Chronic kidney disease, stage 3b: Secondary | ICD-10-CM | POA: Diagnosis not present

## 2023-04-18 DIAGNOSIS — I5033 Acute on chronic diastolic (congestive) heart failure: Secondary | ICD-10-CM | POA: Diagnosis not present

## 2023-04-18 LAB — CBC
HCT: 29.6 % — ABNORMAL LOW (ref 36.0–46.0)
Hemoglobin: 8.6 g/dL — ABNORMAL LOW (ref 12.0–15.0)
MCH: 21.6 pg — ABNORMAL LOW (ref 26.0–34.0)
MCHC: 29.1 g/dL — ABNORMAL LOW (ref 30.0–36.0)
MCV: 74.2 fL — ABNORMAL LOW (ref 80.0–100.0)
Platelets: 273 10*3/uL (ref 150–400)
RBC: 3.99 MIL/uL (ref 3.87–5.11)
RDW: 17.6 % — ABNORMAL HIGH (ref 11.5–15.5)
WBC: 10.3 10*3/uL (ref 4.0–10.5)
nRBC: 0 % (ref 0.0–0.2)

## 2023-04-18 LAB — GLUCOSE, CAPILLARY
Glucose-Capillary: 143 mg/dL — ABNORMAL HIGH (ref 70–99)
Glucose-Capillary: 157 mg/dL — ABNORMAL HIGH (ref 70–99)
Glucose-Capillary: 211 mg/dL — ABNORMAL HIGH (ref 70–99)
Glucose-Capillary: 138 mg/dL — ABNORMAL HIGH (ref 70–99)
Glucose-Capillary: 169 mg/dL — ABNORMAL HIGH (ref 70–99)

## 2023-04-18 LAB — BASIC METABOLIC PANEL
Anion gap: 8 (ref 5–15)
BUN: 23 mg/dL (ref 8–23)
CO2: 30 mmol/L (ref 22–32)
Calcium: 9.4 mg/dL (ref 8.9–10.3)
Chloride: 100 mmol/L (ref 98–111)
Creatinine, Ser: 1.28 mg/dL — ABNORMAL HIGH (ref 0.44–1.00)
GFR, Estimated: 40 mL/min — ABNORMAL LOW (ref >60)
Glucose, Bld: 140 mg/dL — ABNORMAL HIGH (ref 70–99)
Potassium: 3.9 mmol/L (ref 3.5–5.1)
Sodium: 138 mmol/L (ref 135–145)

## 2023-04-18 MED ORDER — HYDRALAZINE HCL 20 MG/ML IJ SOLN
10.0000 mg | INTRAMUSCULAR | Status: DC | PRN
  Administered 2023-04-19: 10 mg via INTRAVENOUS
  Filled 2023-04-18: qty 1

## 2023-04-18 MED ORDER — FUROSEMIDE 20 MG PO TABS
20.0000 mg | ORAL_TABLET | Freq: Every day | ORAL | Status: DC
  Administered 2023-04-19: 20 mg via ORAL
  Filled 2023-04-18: qty 1

## 2023-04-18 MED ORDER — HYDRALAZINE HCL 20 MG/ML IJ SOLN
10.0000 mg | Freq: Once | INTRAMUSCULAR | Status: AC
  Administered 2023-04-18: 10 mg via INTRAVENOUS
  Filled 2023-04-18: qty 1

## 2023-04-18 MED ORDER — FUROSEMIDE 20 MG PO TABS: 20.0000 mg | ORAL_TABLET | Freq: Every day | ORAL | Status: DC

## 2023-04-18 NOTE — Progress Notes (Addendum)
[facility] PROGRESS NOTE    Progress Note  Laurie Fox  KVQ:[id number] DOB: 06/26/1932 DOA: 04/15/2023 PCP: Shireen Quan, DO     Brief Narrative:   Laurie Fox is an 89 y.o. female past medical history of chronic diastolic heart failure, diabetes mellitus type 2, CAD, essential hypertension, chronic kidney disease stage IIIb remote history of breast cancer recently discharged from the hospital 04/09/2023 for syncope and  respiratory failure discharged on 2 L of oxygen.  Comes in with elevated BNP JVD lower extremity edema bilateral pleural effusion started on IV Lasix admitted for acute heart failure she A-fib with RVR started on IV Cardizem drip converted back to sinus rhythm transition to metoprolol continue on Eliquis.   Assessment/Plan:   Acute on chronic diastolic (congestive) heart failure (HCC) Elevated BNP, bilateral pleural effusion last 2D echo showed an EF of 55% grade 2 diastolic dysfunction. Venous Doppler was negative for DVT.  Amlodipine has been discontinued. Started on IV Lasix with good urine output of about 4.5 L. Continue strict I's and O's and daily weights, continue home dose of Jardiance. Basic metabolic panels pending this morning. Continue IV Lasix basic metabolic panels pending this morning positive JVD  New onset A-fib with RVR: With a chads Vascor greater than 5. Continue metoprolol and Eliquis rate control.  Controlled diabetes mellitus type 2 with hyperglycemia and chronic kidney disease without long-term use of insulin: With an A1c of 6.7. Continue Jardiance and sliding scale insulin.  Anemia of chronic renal disease/microcytic anemia: Received IV iron.  Hemoglobin has remained relatively stable.  Chronic kidney disease stage IIIb: Creatinine stable continue to monitor.  Hyperlipidemia/GERD: Continue current medication.  Abnormal TSH: Repeat in 4 to 6 weeks as an outpatient.  Prolonged QTc: Try to keep potassium greater than 4  magnesium greater than 2.  Physical debility: From an ALF PT OT evaluation, will need home health PT.  History of breast cancer: Noted continue anastrozole.  Advance care planning: She is a DNR/DNI.  Obesity: Noted.   HTN (hypertension)  DVT prophylaxis: eliquis Family Communication:none Status is: Inpatient Remains inpatient appropriate because: Acute diastolic heart failure    Code Status:     Code Status Orders  (From admission, onward)           Start     Ordered   04/16/23 0101  Full code  Continuous       Question:  By:  Answer:  Consent: discussion documented in EHR   04/16/23 0100           Code Status History     Date Active Date Inactive Code Status Order ID Comments User Context   04/03/2023 0047 04/10/2023 0157 Full Code [id number]  Tonye Royalty, DO Inpatient   01/10/2023 1540 01/15/2023 2258 Full Code [id number]  Emeline General, MD ED      Advance Directive Documentation    Flowsheet Row Most Recent Value  Type of Advance Directive Living will  Pre-existing out of facility DNR order (yellow form or pink MOST form) --  "MOST" Form in Place? --         IV Access:   Peripheral IV   Procedures and diagnostic studies:   VAS [place] LOWER EXTREMITY VENOUS (DVT)  Result Date: 04/16/2023  Lower Venous DVT Study Patient Name:  Laurie Fox  Date of Exam:   04/16/2023 Medical Rec #: [id number]         Accession #:    [id number] Date of Birth:  15-Jan-1932         Patient Gender: F Patient Age:   25 years Exam Location:  [facility] Procedure:      VAS [place] LOWER EXTREMITY VENOUS (DVT) Referring Phys: CAROLE HALL --------------------------------------------------------------------------------  Indications: Swelling.  Risk Factors: None identified. Limitations: Poor ultrasound/tissue interface. Comparison Study: No prior studies. Performing Technologist: Chanda Busing RVT  Examination Guidelines: A complete evaluation includes B-mode imaging,  spectral Doppler, color Doppler, and power Doppler as needed of all accessible portions of each vessel. Bilateral testing is considered an integral part of a complete examination. Limited examinations for reoccurring indications may be performed as noted. The reflux portion of the exam is performed with the patient in reverse Trendelenburg.  +---------+---------------+---------+-----------+----------+--------------+ RIGHT    CompressibilityPhasicitySpontaneityPropertiesThrombus Aging +---------+---------------+---------+-----------+----------+--------------+ CFV      Full           Yes      Yes                                 +---------+---------------+---------+-----------+----------+--------------+ SFJ      Full                                                        +---------+---------------+---------+-----------+----------+--------------+ FV Prox  Full                                                        +---------+---------------+---------+-----------+----------+--------------+ FV Mid                  Yes      Yes                                 +---------+---------------+---------+-----------+----------+--------------+ FV Distal               Yes      Yes                                 +---------+---------------+---------+-----------+----------+--------------+ PFV      Full                                                        +---------+---------------+---------+-----------+----------+--------------+ POP      Full           Yes      Yes                                 +---------+---------------+---------+-----------+----------+--------------+ PTV      Full                                                        +---------+---------------+---------+-----------+----------+--------------+  PERO     Full                                                        +---------+---------------+---------+-----------+----------+--------------+    +---------+---------------+---------+-----------+----------+-------------------+ LEFT     CompressibilityPhasicitySpontaneityPropertiesThrombus Aging      +---------+---------------+---------+-----------+----------+-------------------+ CFV      Full           Yes      Yes                                      +---------+---------------+---------+-----------+----------+-------------------+ SFJ      Full                                                             +---------+---------------+---------+-----------+----------+-------------------+ FV Prox  Full                                                             +---------+---------------+---------+-----------+----------+-------------------+ FV Mid   Full                                                             +---------+---------------+---------+-----------+----------+-------------------+ FV Distal               Yes      Yes                                      +---------+---------------+---------+-----------+----------+-------------------+ PFV      Full                                                             +---------+---------------+---------+-----------+----------+-------------------+ POP      Full           Yes      Yes                                      +---------+---------------+---------+-----------+----------+-------------------+ PTV      Full                                                             +---------+---------------+---------+-----------+----------+-------------------+ PERO  Not well visualized +---------+---------------+---------+-----------+----------+-------------------+     Summary: RIGHT: - There is no evidence of deep vein thrombosis in the lower extremity. However, portions of this examination were limited- see technologist comments above.  - No cystic structure found in the popliteal fossa.  LEFT: - There is  no evidence of deep vein thrombosis in the lower extremity. However, portions of this examination were limited- see technologist comments above.  - No cystic structure found in the popliteal fossa.  *See table(s) above for measurements and observations. Electronically signed by Lemar Livings MD on 04/16/2023 at 5:39:51 PM.    Final      Medical Consultants:   None.   Subjective:    Laurie Fox relates her breathing is about the same as yesterday.  Objective:    Vitals:   04/18/23 0103 04/18/23 0224 04/18/23 0631 04/18/23 0700  BP: (!) 164/51 (!) 169/63 (!) 178/55 (!) 183/56  Pulse:    73  Resp:    (!) 24  Temp:  (!) 97.5 F (36.4 C) 98.1 F (36.7 C)   TempSrc:  Oral Axillary   SpO2:    (!) 87%  Weight:   73.1 kg   Height:       SpO2: (!) 87 % O2 Flow Rate (L/min): 4 L/min   Intake/Output Summary (Last 24 hours) at 04/18/2023 0838 Last data filed at 04/18/2023 0634 Gross per 24 hour  Intake 1280 ml  Output 2500 ml  Net -1220 ml   Filed Weights   04/15/23 1456 04/17/23 0439 04/18/23 0631  Weight: 69.4 kg 75.3 kg 73.1 kg    Exam: General exam: In no acute distress. Respiratory system: Good air movement and clear to auscultation. Cardiovascular system: S1 & S2 heard, RRR.  Positive JVD Gastrointestinal system: Abdomen is nondistended, soft and nontender.  Extremities: Trace edema Skin: No rashes, lesions or ulcers Psychiatry: Judgement and insight appear normal. Mood & affect appropriate.    Data Reviewed:    Labs: Basic Metabolic Panel: Recent Labs  Lab 04/15/23 1604 04/16/23 0800 04/17/23 0522  NA 137 138 138  K 4.4 4.1 3.7  CL 98 96* 98  CO2 30 30 30   GLUCOSE 133* 203* 135*  BUN 32* 28* 25*  CREATININE 1.15* 1.18* 1.20*  CALCIUM 9.8 9.7 9.4  MG  --  1.9 1.8  PHOS  --  2.8 2.7   GFR Estimated Creatinine Clearance: 30.3 mL/min (A) (by C-G formula based on SCr of 1.2 mg/dL (H)). Liver Function Tests: Recent Labs  Lab 04/16/23 0800  04/17/23 0522  AST 16  --   ALT 14  --   ALKPHOS 87  --   BILITOT 0.8  --   PROT 6.0*  --   ALBUMIN 3.4* 2.9*   No results for input(s): "LIPASE", "AMYLASE" in the last 168 hours. No results for input(s): "AMMONIA" in the last 168 hours. Coagulation profile No results for input(s): "INR", "PROTIME" in the last 168 hours. COVID-19 Labs  Recent Labs    04/16/23 1107  FERRITIN 22    No results found for: "SARSCOV2NAA"  CBC: Recent Labs  Lab 04/15/23 1604 04/16/23 0800 04/17/23 0522 04/18/23 0511  WBC 6.4 10.3 5.9 10.3  NEUTROABS 4.6  --   --   --   HGB 8.6* 9.0* 8.0* 8.6*  HCT 29.4* 30.3* 27.3* 29.6*  MCV 74.8* 73.4* 74.2* 74.2*  PLT 251 274 241 273   Cardiac Enzymes: No results for input(s): "CKTOTAL", "CKMB", "CKMBINDEX", "TROPONINI" in the last  168 hours. BNP (last 3 results) No results for input(s): "PROBNP" in the last 8760 hours. CBG: Recent Labs  Lab 04/17/23 1152 04/17/23 1633 04/17/23 2140 04/18/23 0125 04/18/23 0756  GLUCAP 142* 196* 169* 143* 157*   D-Dimer: No results for input(s): "DDIMER" in the last 72 hours. Hgb A1c: No results for input(s): "HGBA1C" in the last 72 hours. Lipid Profile: No results for input(s): "CHOL", "HDL", "LDLCALC", "TRIG", "CHOLHDL", "LDLDIRECT" in the last 72 hours. Thyroid function studies: Recent Labs    04/16/23 0800  TSH 14.420*   Anemia work up: Recent Labs    04/16/23 1107  VITAMINB12 346  FOLATE 23.8  FERRITIN 22  TIBC 398  IRON 33  RETICCTPCT 1.5   Sepsis Labs: Recent Labs  Lab 04/15/23 1604 04/16/23 0800 04/17/23 0522 04/18/23 0511  WBC 6.4 10.3 5.9 10.3   Microbiology No results found for this or any previous visit (from the past 240 hour(s)).   Medications:    anastrozole  1 mg Oral Daily   apixaban  5 mg Oral BID   empagliflozin  10 mg Oral Daily   famotidine  20 mg Oral Daily   furosemide  40 mg Intravenous Daily   insulin aspart  0-5 Units Subcutaneous QHS   insulin aspart   0-9 Units Subcutaneous TID WC   isosorbide mononitrate  30 mg Oral Daily   metoprolol tartrate  25 mg Oral BID   simvastatin  20 mg Oral q1800   Continuous Infusions:  ferric gluconate (FERRLECIT) IVPB 250 mg (04/17/23 1053)      LOS: 2 days   Marinda Elk  [facility]  04/18/2023, 8:38 AM

## 2023-04-18 NOTE — Progress Notes (Signed)
OT Cancellation Note  Patient Details Name: Laurie Fox MRN: [id number] DOB: 1932-04-02   Cancelled Treatment:     Pt declined due to fatigue/preferring to rest. Will continue efforts.   Reuben Likes, OTR/L 04/18/2023, 5:15 PM

## 2023-04-18 NOTE — Plan of Care (Signed)
  Problem: Education: Goal: Knowledge of the prescribed therapeutic regimen will improve Outcome: Progressing Goal: Understanding of discharge needs will improve Outcome: Progressing Goal: Individualized Educational Video(s) Outcome: Progressing   Problem: Activity: Goal: Ability to avoid complications of mobility impairment will improve Outcome: Progressing Goal: Ability to tolerate increased activity will improve Outcome: Progressing   Problem: Clinical Measurements: Goal: Postoperative complications will be avoided or minimized Outcome: Progressing   Problem: Pain Management: Goal: Pain level will decrease with appropriate interventions Outcome: Progressing   Problem: Skin Integrity: Goal: Will show signs of wound healing Outcome: Progressing   Problem: Education: Goal: Ability to describe self-care measures that may prevent or decrease complications (Diabetes Survival Skills Education) will improve Outcome: Progressing Goal: Individualized Educational Video(s) Outcome: Progressing   Problem: Coping: Goal: Ability to adjust to condition or change in health will improve Outcome: Progressing   Problem: Fluid Volume: Goal: Ability to maintain a balanced intake and output will improve Outcome: Progressing   Problem: Health Behavior/Discharge Planning: Goal: Ability to identify and utilize available resources and services will improve Outcome: Progressing Goal: Ability to manage health-related needs will improve Outcome: Progressing   Problem: Metabolic: Goal: Ability to maintain appropriate glucose levels will improve Outcome: Progressing   Problem: Nutritional: Goal: Maintenance of adequate nutrition will improve Outcome: Progressing Goal: Progress toward achieving an optimal weight will improve Outcome: Progressing   Problem: Skin Integrity: Goal: Risk for impaired skin integrity will decrease Outcome: Progressing   Problem: Tissue Perfusion: Goal:  Adequacy of tissue perfusion will improve Outcome: Progressing   Problem: Education: Goal: Knowledge of General Education information will improve Description: Including pain rating scale, medication(s)/side effects and non-pharmacologic comfort measures Outcome: Progressing   Problem: Health Behavior/Discharge Planning: Goal: Ability to manage health-related needs will improve Outcome: Progressing   Problem: Clinical Measurements: Goal: Ability to maintain clinical measurements within normal limits will improve Outcome: Progressing Goal: Will remain free from infection Outcome: Progressing Goal: Diagnostic test results will improve Outcome: Progressing Goal: Respiratory complications will improve Outcome: Progressing Goal: Cardiovascular complication will be avoided Outcome: Progressing   Problem: Activity: Goal: Risk for activity intolerance will decrease Outcome: Progressing   Problem: Nutrition: Goal: Adequate nutrition will be maintained Outcome: Progressing   Problem: Coping: Goal: Level of anxiety will decrease Outcome: Progressing   Problem: Elimination: Goal: Will not experience complications related to bowel motility Outcome: Progressing Goal: Will not experience complications related to urinary retention Outcome: Progressing   Problem: Pain Managment: Goal: General experience of comfort will improve Outcome: Progressing   Problem: Safety: Goal: Ability to remain free from injury will improve Outcome: Progressing   Problem: Skin Integrity: Goal: Risk for impaired skin integrity will decrease Outcome: Progressing   

## 2023-04-18 NOTE — TOC Progression Note (Signed)
Transition of Care [facility]) - Progression Note    Patient Details  Name: Laurie Fox MRN: [id number] Date of Birth: Mar 09, 1932  [facility] CM/SW Contact  Sebastain Fishbaugh, Olegario Messier, RN Phone Number: 04/18/2023, 10:12 AM  Clinical Narrative:  Sherron Monday to dtr Chales Abrahams Kluger(dtr) about d/c plans-agree to return to [name] rep [facility]/OT/adding aide -already active rep Brandi aware;Noted on 02-if home 02 needed @ ALF will need documented 02 sats within 48hrs of d/c & home 02 order w/liter flow,duration,route-[facility] rep Marthann Schiller already following. PTAR @ d/c when stable.MD updated.    Expected Discharge Plan: Assisted Living Barriers to Discharge: Continued Medical Work up  Expected Discharge Plan and Services   Discharge Planning Services: CM Consult   Living arrangements for the past 2 months: Assisted Living Facility                                       Social Determinants of Health (SDOH) Interventions SDOH Screenings   Food Insecurity: No Food Insecurity (04/16/2023)  Housing: Low Risk  (04/16/2023)  Transportation Needs: No Transportation Needs (04/16/2023)  Utilities: Not At Risk (04/16/2023)  Tobacco Use: Unknown (04/15/2023)    Readmission Risk Interventions    04/16/2023    3:14 PM 01/11/2023    2:10 PM  Readmission Risk Prevention Plan  Transportation Screening Complete Complete  PCP or Specialist Appt within 5-7 Days  Complete  PCP or Specialist Appt within 3-5 Days Complete   Home Care Screening  Complete  Medication Review (RN CM)  Complete  HRI or Home Care Consult Complete   Social Work Consult for Recovery Care Planning/Counseling Complete   Palliative Care Screening Not Applicable   Medication Review Oceanographer) Complete

## 2023-04-19 DIAGNOSIS — N183 Chronic kidney disease, stage 3 unspecified: Secondary | ICD-10-CM | POA: Diagnosis not present

## 2023-04-19 DIAGNOSIS — N1832 Chronic kidney disease, stage 3b: Secondary | ICD-10-CM | POA: Diagnosis not present

## 2023-04-19 DIAGNOSIS — E1122 Type 2 diabetes mellitus with diabetic chronic kidney disease: Secondary | ICD-10-CM | POA: Diagnosis not present

## 2023-04-19 DIAGNOSIS — I5033 Acute on chronic diastolic (congestive) heart failure: Secondary | ICD-10-CM | POA: Diagnosis not present

## 2023-04-19 LAB — BASIC METABOLIC PANEL
Anion gap: 10 (ref 5–15)
BUN: 21 mg/dL (ref 8–23)
CO2: 32 mmol/L (ref 22–32)
Calcium: 9.3 mg/dL (ref 8.9–10.3)
Chloride: 96 mmol/L — ABNORMAL LOW (ref 98–111)
Creatinine, Ser: 1.07 mg/dL — ABNORMAL HIGH (ref 0.44–1.00)
GFR, Estimated: 50 mL/min — ABNORMAL LOW (ref >60)
Glucose, Bld: 132 mg/dL — ABNORMAL HIGH (ref 70–99)
Potassium: 3.4 mmol/L — ABNORMAL LOW (ref 3.5–5.1)
Sodium: 138 mmol/L (ref 135–145)

## 2023-04-19 LAB — GLUCOSE, CAPILLARY
Glucose-Capillary: 147 mg/dL — ABNORMAL HIGH (ref 70–99)
Glucose-Capillary: 169 mg/dL — ABNORMAL HIGH (ref 70–99)

## 2023-04-19 MED ORDER — IRBESARTAN 150 MG PO TABS
150.0000 mg | ORAL_TABLET | Freq: Every day | ORAL | Status: DC
  Administered 2023-04-19: 150 mg via ORAL
  Filled 2023-04-19: qty 1

## 2023-04-19 MED ORDER — IRBESARTAN 150 MG PO TABS: 75.0000 mg | ORAL_TABLET | Freq: Every day | ORAL | Status: DC

## 2023-04-19 MED ORDER — METOPROLOL TARTRATE 25 MG PO TABS: 25.0000 mg | ORAL_TABLET | Freq: Two times a day (BID) | ORAL | 0 refills | Status: AC

## 2023-04-19 MED ORDER — APIXABAN 5 MG PO TABS: 5.0000 mg | ORAL_TABLET | Freq: Two times a day (BID) | ORAL | 0 refills | Status: AC

## 2023-04-19 MED ORDER — POTASSIUM CHLORIDE CRYS ER 20 MEQ PO TBCR
40.0000 meq | EXTENDED_RELEASE_TABLET | Freq: Two times a day (BID) | ORAL | Status: DC
  Administered 2023-04-19: 40 meq via ORAL
  Filled 2023-04-19: qty 2

## 2023-04-19 MED ORDER — EMPAGLIFLOZIN 10 MG PO TABS: 10.0000 mg | ORAL_TABLET | Freq: Every day | ORAL | 0 refills | Status: AC

## 2023-04-19 NOTE — Discharge Summary (Signed)
and MIPs were obtained to evaluate the vascular anatomy. RADIATION DOSE REDUCTION: This exam was performed according to the departmental dose-optimization program which includes automated exposure control, adjustment of the mA and/or kV according to patient size and/or use of iterative reconstruction technique. CONTRAST:  75mL OMNIPAQUE IOHEXOL 350 MG/ML SOLN COMPARISON:  Chest radiograph dated 03/19/2023. FINDINGS: Cardiovascular: Mild cardiomegaly. No pericardial effusion. There is coronary vascular calcification and calcification of the mitral annulus. Moderate atherosclerotic calcification of the thoracic  aorta. No pulmonary artery embolus identified. Mediastinum/Nodes: No hilar adenopathy. The esophagus is grossly unremarkable. No mediastinal fluid collection. Lungs/Pleura: Small bilateral pleural effusions and bibasilar atelectasis. Pneumonia is not excluded. Clinical correlation is recommended. There is no pneumothorax. The central airways are patent. Upper Abdomen: No acute abnormality. Musculoskeletal: Diffuse subcutaneous edema and anasarca. Osteopenia with degenerative changes. No acute osseous pathology. Multiple surgical clips in the right breast. Review of the MIP images confirms the above findings. IMPRESSION: 1. No CT evidence of pulmonary embolism. 2. Small bilateral pleural effusions and bibasilar atelectasis. Pneumonia is not excluded. 3.  Aortic Atherosclerosis (ICD10-I70.0). Electronically Signed   By: Elgie Collard M.D.   On: 04/02/2023 20:31   CT HEAD WO CONTRAST  Result Date: 04/02/2023 CLINICAL DATA:  Altered mental status. EXAM: CT HEAD WITHOUT CONTRAST TECHNIQUE: Contiguous axial images were obtained from the base of the skull through the vertex without intravenous contrast. RADIATION DOSE REDUCTION: This exam was performed according to the departmental dose-optimization program which includes automated exposure control, adjustment of the mA and/or kV according to patient size and/or use of iterative reconstruction technique. COMPARISON:  January 06, 2023 FINDINGS: Brain: There is mild cerebral atrophy with widening of the extra-axial spaces and ventricular dilatation. There are areas of decreased attenuation within the white matter tracts of the supratentorial brain, consistent with microvascular disease changes. Vascular: There is marked severity bilateral cavernous carotid artery calcification. Skull: Normal. Negative for fracture or focal lesion. Sinuses/Orbits: No acute finding. Other: None. IMPRESSION: 1. Generalized cerebral atrophy with chronic white matter small vessel ischemic  changes. 2. No acute intracranial abnormality. Electronically Signed   By: Aram Candela M.D.   On: 04/02/2023 19:53   ECHOCARDIOGRAM COMPLETE  Result Date: 03/22/2023    ECHOCARDIOGRAM REPORT   Patient Name:   Laurie Fox Date of Exam: 03/22/2023 Medical Rec #:  [id number]        Height:       63.0 in Accession #:    [id number]       Weight:       149.3 lb Date of Birth:  [birth year]        BSA:          1.707 m Patient Age:    89 years         BP:           206/61 mmHg Patient Gender: F                HR:           59 bpm. Exam Location:  Inpatient Procedure: 2D Echo, 3D Echo, Color Doppler, Cardiac Doppler and Strain Analysis Indications:    I10 Hypertension  History:        Patient has prior history of Echocardiogram examinations, most                 recent 01/11/2023. Risk Factors:Hypertension and Diabetes. Breast  and MIPs were obtained to evaluate the vascular anatomy. RADIATION DOSE REDUCTION: This exam was performed according to the departmental dose-optimization program which includes automated exposure control, adjustment of the mA and/or kV according to patient size and/or use of iterative reconstruction technique. CONTRAST:  75mL OMNIPAQUE IOHEXOL 350 MG/ML SOLN COMPARISON:  Chest radiograph dated 03/19/2023. FINDINGS: Cardiovascular: Mild cardiomegaly. No pericardial effusion. There is coronary vascular calcification and calcification of the mitral annulus. Moderate atherosclerotic calcification of the thoracic  aorta. No pulmonary artery embolus identified. Mediastinum/Nodes: No hilar adenopathy. The esophagus is grossly unremarkable. No mediastinal fluid collection. Lungs/Pleura: Small bilateral pleural effusions and bibasilar atelectasis. Pneumonia is not excluded. Clinical correlation is recommended. There is no pneumothorax. The central airways are patent. Upper Abdomen: No acute abnormality. Musculoskeletal: Diffuse subcutaneous edema and anasarca. Osteopenia with degenerative changes. No acute osseous pathology. Multiple surgical clips in the right breast. Review of the MIP images confirms the above findings. IMPRESSION: 1. No CT evidence of pulmonary embolism. 2. Small bilateral pleural effusions and bibasilar atelectasis. Pneumonia is not excluded. 3.  Aortic Atherosclerosis (ICD10-I70.0). Electronically Signed   By: Elgie Collard M.D.   On: 04/02/2023 20:31   CT HEAD WO CONTRAST  Result Date: 04/02/2023 CLINICAL DATA:  Altered mental status. EXAM: CT HEAD WITHOUT CONTRAST TECHNIQUE: Contiguous axial images were obtained from the base of the skull through the vertex without intravenous contrast. RADIATION DOSE REDUCTION: This exam was performed according to the departmental dose-optimization program which includes automated exposure control, adjustment of the mA and/or kV according to patient size and/or use of iterative reconstruction technique. COMPARISON:  January 06, 2023 FINDINGS: Brain: There is mild cerebral atrophy with widening of the extra-axial spaces and ventricular dilatation. There are areas of decreased attenuation within the white matter tracts of the supratentorial brain, consistent with microvascular disease changes. Vascular: There is marked severity bilateral cavernous carotid artery calcification. Skull: Normal. Negative for fracture or focal lesion. Sinuses/Orbits: No acute finding. Other: None. IMPRESSION: 1. Generalized cerebral atrophy with chronic white matter small vessel ischemic  changes. 2. No acute intracranial abnormality. Electronically Signed   By: Aram Candela M.D.   On: 04/02/2023 19:53   ECHOCARDIOGRAM COMPLETE  Result Date: 03/22/2023    ECHOCARDIOGRAM REPORT   Patient Name:   Laurie Fox Date of Exam: 03/22/2023 Medical Rec #:  [id number]        Height:       63.0 in Accession #:    [id number]       Weight:       149.3 lb Date of Birth:  [birth year]        BSA:          1.707 m Patient Age:    89 years         BP:           206/61 mmHg Patient Gender: F                HR:           59 bpm. Exam Location:  Inpatient Procedure: 2D Echo, 3D Echo, Color Doppler, Cardiac Doppler and Strain Analysis Indications:    I10 Hypertension  History:        Patient has prior history of Echocardiogram examinations, most                 recent 01/11/2023. Risk Factors:Hypertension and Diabetes. Breast  Physician Discharge Summary  Laurie Fox ZOX:[id number] DOB: 1932/01/01 DOA: 04/15/2023  PCP: Shireen Quan, DO  Admit date: 04/15/2023 Discharge date: 04/19/2023  Admitted From: Home Disposition:  Home  Recommendations for Outpatient Follow-up:  Follow up with PCP in 1-2 weeks Please obtain BMP and TSH and free T4 in 6 weeks postdischarge.  Home Health:Yes Equipment/Devices:None  Discharge Condition:Stable CODE STATUS:Full Diet recommendation: Heart Healthy   Brief/Interim Summary:  89 y.o. female past medical history of chronic diastolic heart failure, diabetes mellitus type 2, CAD, essential hypertension, chronic kidney disease stage IIIb remote history of breast cancer recently discharged from the hospital 04/09/2023 for syncope and  respiratory failure discharged on 2 L of oxygen.  Comes in with elevated BNP JVD lower extremity edema bilateral pleural effusion started on IV Lasix admitted for acute heart failure she A-fib with RVR started on IV Cardizem drip converted back to sinus rhythm transition to metoprolol continue on Eliquis.    Discharge Diagnoses:  Principal Problem:   Acute on chronic diastolic (congestive) heart failure (HCC) Active Problems:   HTN (hypertension)   Type 2 diabetes mellitus with chronic kidney disease, without long-term current use of insulin (HCC)   New onset atrial fibrillation (HCC)   Chronic kidney disease, stage 3b (HCC)  Acute on chronic diastolic heart failure: Elevated BNP bilateral pleural effusions 2D echo showed an EF of 55% with grade 2 diastolic dysfunction. Lower extremity Doppler was negative for DVT, due to her lower extremity swelling amlodipine was discontinued. She was started on IV Lasix she diuresed about 6.5 L. She was also started on her home dose of Jardiance. Her creatinine remained stable she will continue Lasix daily at home, continue ARB, hydralazine,  and Imdur. She will go home on oxygen on 2 L for the next 2 weeks.   Hopefully she will be weaned off as an outpatient  New onset atrial fibrillation: With a chads Vascor greater than 5. She was started on metoprolol and Eliquis she has been rate controlled she continue Eliquis as an outpatient.  Controlled diabetes mellitus type 2 with hyperglycemia/chronic kidney disease without long-term use of insulin: She will continue Jardiance at home she required minimal insulin in house. No changes made to her medication.  Anemia of chronic disease/microcytic anemia: She received IV iron and her hemoglobin remained stable follow-up with PCP as an outpatient.  Chronic kidney stage IIIb: Creatinine remained at baseline.  Hyperlipidemia: No changes made to her medication.  Abnormal TSH: Will need to be repeated in 4 to 6 weeks as an outpatient.  Prolonged QTc: Her potassium was kept greater than 4 magnesium greater than 2.  Advance care planning: She remains a full code  Obesity: Noted.  Discharge Instructions  Discharge Instructions     Diet - low sodium heart healthy   Complete by: As directed    Increase activity slowly   Complete by: As directed       Allergies as of 04/19/2023       Reactions   Codeine Nausea And Vomiting   Other Reaction(s): dizziness   Fexofenadine    Other Reaction(s): h/a   Loteprednol Etabonate    Other Reaction(s): swelling & dificulty breathing, chest pain & dizziness   Sulfa Antibiotics Rash        Medication List     STOP taking these medications    amLODipine 10 MG tablet Commonly known as: NORVASC   ASCORBIC ACID PO   furosemide 40 MG tablet Commonly known as: LASIX  and MIPs were obtained to evaluate the vascular anatomy. RADIATION DOSE REDUCTION: This exam was performed according to the departmental dose-optimization program which includes automated exposure control, adjustment of the mA and/or kV according to patient size and/or use of iterative reconstruction technique. CONTRAST:  75mL OMNIPAQUE IOHEXOL 350 MG/ML SOLN COMPARISON:  Chest radiograph dated 03/19/2023. FINDINGS: Cardiovascular: Mild cardiomegaly. No pericardial effusion. There is coronary vascular calcification and calcification of the mitral annulus. Moderate atherosclerotic calcification of the thoracic  aorta. No pulmonary artery embolus identified. Mediastinum/Nodes: No hilar adenopathy. The esophagus is grossly unremarkable. No mediastinal fluid collection. Lungs/Pleura: Small bilateral pleural effusions and bibasilar atelectasis. Pneumonia is not excluded. Clinical correlation is recommended. There is no pneumothorax. The central airways are patent. Upper Abdomen: No acute abnormality. Musculoskeletal: Diffuse subcutaneous edema and anasarca. Osteopenia with degenerative changes. No acute osseous pathology. Multiple surgical clips in the right breast. Review of the MIP images confirms the above findings. IMPRESSION: 1. No CT evidence of pulmonary embolism. 2. Small bilateral pleural effusions and bibasilar atelectasis. Pneumonia is not excluded. 3.  Aortic Atherosclerosis (ICD10-I70.0). Electronically Signed   By: Elgie Collard M.D.   On: 04/02/2023 20:31   CT HEAD WO CONTRAST  Result Date: 04/02/2023 CLINICAL DATA:  Altered mental status. EXAM: CT HEAD WITHOUT CONTRAST TECHNIQUE: Contiguous axial images were obtained from the base of the skull through the vertex without intravenous contrast. RADIATION DOSE REDUCTION: This exam was performed according to the departmental dose-optimization program which includes automated exposure control, adjustment of the mA and/or kV according to patient size and/or use of iterative reconstruction technique. COMPARISON:  January 06, 2023 FINDINGS: Brain: There is mild cerebral atrophy with widening of the extra-axial spaces and ventricular dilatation. There are areas of decreased attenuation within the white matter tracts of the supratentorial brain, consistent with microvascular disease changes. Vascular: There is marked severity bilateral cavernous carotid artery calcification. Skull: Normal. Negative for fracture or focal lesion. Sinuses/Orbits: No acute finding. Other: None. IMPRESSION: 1. Generalized cerebral atrophy with chronic white matter small vessel ischemic  changes. 2. No acute intracranial abnormality. Electronically Signed   By: Aram Candela M.D.   On: 04/02/2023 19:53   ECHOCARDIOGRAM COMPLETE  Result Date: 03/22/2023    ECHOCARDIOGRAM REPORT   Patient Name:   Laurie Fox Date of Exam: 03/22/2023 Medical Rec #:  [id number]        Height:       63.0 in Accession #:    [id number]       Weight:       149.3 lb Date of Birth:  [birth year]        BSA:          1.707 m Patient Age:    89 years         BP:           206/61 mmHg Patient Gender: F                HR:           59 bpm. Exam Location:  Inpatient Procedure: 2D Echo, 3D Echo, Color Doppler, Cardiac Doppler and Strain Analysis Indications:    I10 Hypertension  History:        Patient has prior history of Echocardiogram examinations, most                 recent 01/11/2023. Risk Factors:Hypertension and Diabetes. Breast  Physician Discharge Summary  Laurie Fox ZOX:[id number] DOB: 1932/01/01 DOA: 04/15/2023  PCP: Shireen Quan, DO  Admit date: 04/15/2023 Discharge date: 04/19/2023  Admitted From: Home Disposition:  Home  Recommendations for Outpatient Follow-up:  Follow up with PCP in 1-2 weeks Please obtain BMP and TSH and free T4 in 6 weeks postdischarge.  Home Health:Yes Equipment/Devices:None  Discharge Condition:Stable CODE STATUS:Full Diet recommendation: Heart Healthy   Brief/Interim Summary:  89 y.o. female past medical history of chronic diastolic heart failure, diabetes mellitus type 2, CAD, essential hypertension, chronic kidney disease stage IIIb remote history of breast cancer recently discharged from the hospital 04/09/2023 for syncope and  respiratory failure discharged on 2 L of oxygen.  Comes in with elevated BNP JVD lower extremity edema bilateral pleural effusion started on IV Lasix admitted for acute heart failure she A-fib with RVR started on IV Cardizem drip converted back to sinus rhythm transition to metoprolol continue on Eliquis.    Discharge Diagnoses:  Principal Problem:   Acute on chronic diastolic (congestive) heart failure (HCC) Active Problems:   HTN (hypertension)   Type 2 diabetes mellitus with chronic kidney disease, without long-term current use of insulin (HCC)   New onset atrial fibrillation (HCC)   Chronic kidney disease, stage 3b (HCC)  Acute on chronic diastolic heart failure: Elevated BNP bilateral pleural effusions 2D echo showed an EF of 55% with grade 2 diastolic dysfunction. Lower extremity Doppler was negative for DVT, due to her lower extremity swelling amlodipine was discontinued. She was started on IV Lasix she diuresed about 6.5 L. She was also started on her home dose of Jardiance. Her creatinine remained stable she will continue Lasix daily at home, continue ARB, hydralazine,  and Imdur. She will go home on oxygen on 2 L for the next 2 weeks.   Hopefully she will be weaned off as an outpatient  New onset atrial fibrillation: With a chads Vascor greater than 5. She was started on metoprolol and Eliquis she has been rate controlled she continue Eliquis as an outpatient.  Controlled diabetes mellitus type 2 with hyperglycemia/chronic kidney disease without long-term use of insulin: She will continue Jardiance at home she required minimal insulin in house. No changes made to her medication.  Anemia of chronic disease/microcytic anemia: She received IV iron and her hemoglobin remained stable follow-up with PCP as an outpatient.  Chronic kidney stage IIIb: Creatinine remained at baseline.  Hyperlipidemia: No changes made to her medication.  Abnormal TSH: Will need to be repeated in 4 to 6 weeks as an outpatient.  Prolonged QTc: Her potassium was kept greater than 4 magnesium greater than 2.  Advance care planning: She remains a full code  Obesity: Noted.  Discharge Instructions  Discharge Instructions     Diet - low sodium heart healthy   Complete by: As directed    Increase activity slowly   Complete by: As directed       Allergies as of 04/19/2023       Reactions   Codeine Nausea And Vomiting   Other Reaction(s): dizziness   Fexofenadine    Other Reaction(s): h/a   Loteprednol Etabonate    Other Reaction(s): swelling & dificulty breathing, chest pain & dizziness   Sulfa Antibiotics Rash        Medication List     STOP taking these medications    amLODipine 10 MG tablet Commonly known as: NORVASC   ASCORBIC ACID PO   furosemide 40 MG tablet Commonly known as: LASIX  Physician Discharge Summary  Laurie Fox ZOX:[id number] DOB: 1932/01/01 DOA: 04/15/2023  PCP: Shireen Quan, DO  Admit date: 04/15/2023 Discharge date: 04/19/2023  Admitted From: Home Disposition:  Home  Recommendations for Outpatient Follow-up:  Follow up with PCP in 1-2 weeks Please obtain BMP and TSH and free T4 in 6 weeks postdischarge.  Home Health:Yes Equipment/Devices:None  Discharge Condition:Stable CODE STATUS:Full Diet recommendation: Heart Healthy   Brief/Interim Summary:  89 y.o. female past medical history of chronic diastolic heart failure, diabetes mellitus type 2, CAD, essential hypertension, chronic kidney disease stage IIIb remote history of breast cancer recently discharged from the hospital 04/09/2023 for syncope and  respiratory failure discharged on 2 L of oxygen.  Comes in with elevated BNP JVD lower extremity edema bilateral pleural effusion started on IV Lasix admitted for acute heart failure she A-fib with RVR started on IV Cardizem drip converted back to sinus rhythm transition to metoprolol continue on Eliquis.    Discharge Diagnoses:  Principal Problem:   Acute on chronic diastolic (congestive) heart failure (HCC) Active Problems:   HTN (hypertension)   Type 2 diabetes mellitus with chronic kidney disease, without long-term current use of insulin (HCC)   New onset atrial fibrillation (HCC)   Chronic kidney disease, stage 3b (HCC)  Acute on chronic diastolic heart failure: Elevated BNP bilateral pleural effusions 2D echo showed an EF of 55% with grade 2 diastolic dysfunction. Lower extremity Doppler was negative for DVT, due to her lower extremity swelling amlodipine was discontinued. She was started on IV Lasix she diuresed about 6.5 L. She was also started on her home dose of Jardiance. Her creatinine remained stable she will continue Lasix daily at home, continue ARB, hydralazine,  and Imdur. She will go home on oxygen on 2 L for the next 2 weeks.   Hopefully she will be weaned off as an outpatient  New onset atrial fibrillation: With a chads Vascor greater than 5. She was started on metoprolol and Eliquis she has been rate controlled she continue Eliquis as an outpatient.  Controlled diabetes mellitus type 2 with hyperglycemia/chronic kidney disease without long-term use of insulin: She will continue Jardiance at home she required minimal insulin in house. No changes made to her medication.  Anemia of chronic disease/microcytic anemia: She received IV iron and her hemoglobin remained stable follow-up with PCP as an outpatient.  Chronic kidney stage IIIb: Creatinine remained at baseline.  Hyperlipidemia: No changes made to her medication.  Abnormal TSH: Will need to be repeated in 4 to 6 weeks as an outpatient.  Prolonged QTc: Her potassium was kept greater than 4 magnesium greater than 2.  Advance care planning: She remains a full code  Obesity: Noted.  Discharge Instructions  Discharge Instructions     Diet - low sodium heart healthy   Complete by: As directed    Increase activity slowly   Complete by: As directed       Allergies as of 04/19/2023       Reactions   Codeine Nausea And Vomiting   Other Reaction(s): dizziness   Fexofenadine    Other Reaction(s): h/a   Loteprednol Etabonate    Other Reaction(s): swelling & dificulty breathing, chest pain & dizziness   Sulfa Antibiotics Rash        Medication List     STOP taking these medications    amLODipine 10 MG tablet Commonly known as: NORVASC   ASCORBIC ACID PO   furosemide 40 MG tablet Commonly known as: LASIX  Physician Discharge Summary  Laurie Fox ZOX:[id number] DOB: 1932/01/01 DOA: 04/15/2023  PCP: Shireen Quan, DO  Admit date: 04/15/2023 Discharge date: 04/19/2023  Admitted From: Home Disposition:  Home  Recommendations for Outpatient Follow-up:  Follow up with PCP in 1-2 weeks Please obtain BMP and TSH and free T4 in 6 weeks postdischarge.  Home Health:Yes Equipment/Devices:None  Discharge Condition:Stable CODE STATUS:Full Diet recommendation: Heart Healthy   Brief/Interim Summary:  89 y.o. female past medical history of chronic diastolic heart failure, diabetes mellitus type 2, CAD, essential hypertension, chronic kidney disease stage IIIb remote history of breast cancer recently discharged from the hospital 04/09/2023 for syncope and  respiratory failure discharged on 2 L of oxygen.  Comes in with elevated BNP JVD lower extremity edema bilateral pleural effusion started on IV Lasix admitted for acute heart failure she A-fib with RVR started on IV Cardizem drip converted back to sinus rhythm transition to metoprolol continue on Eliquis.    Discharge Diagnoses:  Principal Problem:   Acute on chronic diastolic (congestive) heart failure (HCC) Active Problems:   HTN (hypertension)   Type 2 diabetes mellitus with chronic kidney disease, without long-term current use of insulin (HCC)   New onset atrial fibrillation (HCC)   Chronic kidney disease, stage 3b (HCC)  Acute on chronic diastolic heart failure: Elevated BNP bilateral pleural effusions 2D echo showed an EF of 55% with grade 2 diastolic dysfunction. Lower extremity Doppler was negative for DVT, due to her lower extremity swelling amlodipine was discontinued. She was started on IV Lasix she diuresed about 6.5 L. She was also started on her home dose of Jardiance. Her creatinine remained stable she will continue Lasix daily at home, continue ARB, hydralazine,  and Imdur. She will go home on oxygen on 2 L for the next 2 weeks.   Hopefully she will be weaned off as an outpatient  New onset atrial fibrillation: With a chads Vascor greater than 5. She was started on metoprolol and Eliquis she has been rate controlled she continue Eliquis as an outpatient.  Controlled diabetes mellitus type 2 with hyperglycemia/chronic kidney disease without long-term use of insulin: She will continue Jardiance at home she required minimal insulin in house. No changes made to her medication.  Anemia of chronic disease/microcytic anemia: She received IV iron and her hemoglobin remained stable follow-up with PCP as an outpatient.  Chronic kidney stage IIIb: Creatinine remained at baseline.  Hyperlipidemia: No changes made to her medication.  Abnormal TSH: Will need to be repeated in 4 to 6 weeks as an outpatient.  Prolonged QTc: Her potassium was kept greater than 4 magnesium greater than 2.  Advance care planning: She remains a full code  Obesity: Noted.  Discharge Instructions  Discharge Instructions     Diet - low sodium heart healthy   Complete by: As directed    Increase activity slowly   Complete by: As directed       Allergies as of 04/19/2023       Reactions   Codeine Nausea And Vomiting   Other Reaction(s): dizziness   Fexofenadine    Other Reaction(s): h/a   Loteprednol Etabonate    Other Reaction(s): swelling & dificulty breathing, chest pain & dizziness   Sulfa Antibiotics Rash        Medication List     STOP taking these medications    amLODipine 10 MG tablet Commonly known as: NORVASC   ASCORBIC ACID PO   furosemide 40 MG tablet Commonly known as: LASIX  Physician Discharge Summary  Laurie Fox ZOX:[id number] DOB: 1932/01/01 DOA: 04/15/2023  PCP: Shireen Quan, DO  Admit date: 04/15/2023 Discharge date: 04/19/2023  Admitted From: Home Disposition:  Home  Recommendations for Outpatient Follow-up:  Follow up with PCP in 1-2 weeks Please obtain BMP and TSH and free T4 in 6 weeks postdischarge.  Home Health:Yes Equipment/Devices:None  Discharge Condition:Stable CODE STATUS:Full Diet recommendation: Heart Healthy   Brief/Interim Summary:  89 y.o. female past medical history of chronic diastolic heart failure, diabetes mellitus type 2, CAD, essential hypertension, chronic kidney disease stage IIIb remote history of breast cancer recently discharged from the hospital 04/09/2023 for syncope and  respiratory failure discharged on 2 L of oxygen.  Comes in with elevated BNP JVD lower extremity edema bilateral pleural effusion started on IV Lasix admitted for acute heart failure she A-fib with RVR started on IV Cardizem drip converted back to sinus rhythm transition to metoprolol continue on Eliquis.    Discharge Diagnoses:  Principal Problem:   Acute on chronic diastolic (congestive) heart failure (HCC) Active Problems:   HTN (hypertension)   Type 2 diabetes mellitus with chronic kidney disease, without long-term current use of insulin (HCC)   New onset atrial fibrillation (HCC)   Chronic kidney disease, stage 3b (HCC)  Acute on chronic diastolic heart failure: Elevated BNP bilateral pleural effusions 2D echo showed an EF of 55% with grade 2 diastolic dysfunction. Lower extremity Doppler was negative for DVT, due to her lower extremity swelling amlodipine was discontinued. She was started on IV Lasix she diuresed about 6.5 L. She was also started on her home dose of Jardiance. Her creatinine remained stable she will continue Lasix daily at home, continue ARB, hydralazine,  and Imdur. She will go home on oxygen on 2 L for the next 2 weeks.   Hopefully she will be weaned off as an outpatient  New onset atrial fibrillation: With a chads Vascor greater than 5. She was started on metoprolol and Eliquis she has been rate controlled she continue Eliquis as an outpatient.  Controlled diabetes mellitus type 2 with hyperglycemia/chronic kidney disease without long-term use of insulin: She will continue Jardiance at home she required minimal insulin in house. No changes made to her medication.  Anemia of chronic disease/microcytic anemia: She received IV iron and her hemoglobin remained stable follow-up with PCP as an outpatient.  Chronic kidney stage IIIb: Creatinine remained at baseline.  Hyperlipidemia: No changes made to her medication.  Abnormal TSH: Will need to be repeated in 4 to 6 weeks as an outpatient.  Prolonged QTc: Her potassium was kept greater than 4 magnesium greater than 2.  Advance care planning: She remains a full code  Obesity: Noted.  Discharge Instructions  Discharge Instructions     Diet - low sodium heart healthy   Complete by: As directed    Increase activity slowly   Complete by: As directed       Allergies as of 04/19/2023       Reactions   Codeine Nausea And Vomiting   Other Reaction(s): dizziness   Fexofenadine    Other Reaction(s): h/a   Loteprednol Etabonate    Other Reaction(s): swelling & dificulty breathing, chest pain & dizziness   Sulfa Antibiotics Rash        Medication List     STOP taking these medications    amLODipine 10 MG tablet Commonly known as: NORVASC   ASCORBIC ACID PO   furosemide 40 MG tablet Commonly known as: LASIX  and MIPs were obtained to evaluate the vascular anatomy. RADIATION DOSE REDUCTION: This exam was performed according to the departmental dose-optimization program which includes automated exposure control, adjustment of the mA and/or kV according to patient size and/or use of iterative reconstruction technique. CONTRAST:  75mL OMNIPAQUE IOHEXOL 350 MG/ML SOLN COMPARISON:  Chest radiograph dated 03/19/2023. FINDINGS: Cardiovascular: Mild cardiomegaly. No pericardial effusion. There is coronary vascular calcification and calcification of the mitral annulus. Moderate atherosclerotic calcification of the thoracic  aorta. No pulmonary artery embolus identified. Mediastinum/Nodes: No hilar adenopathy. The esophagus is grossly unremarkable. No mediastinal fluid collection. Lungs/Pleura: Small bilateral pleural effusions and bibasilar atelectasis. Pneumonia is not excluded. Clinical correlation is recommended. There is no pneumothorax. The central airways are patent. Upper Abdomen: No acute abnormality. Musculoskeletal: Diffuse subcutaneous edema and anasarca. Osteopenia with degenerative changes. No acute osseous pathology. Multiple surgical clips in the right breast. Review of the MIP images confirms the above findings. IMPRESSION: 1. No CT evidence of pulmonary embolism. 2. Small bilateral pleural effusions and bibasilar atelectasis. Pneumonia is not excluded. 3.  Aortic Atherosclerosis (ICD10-I70.0). Electronically Signed   By: Elgie Collard M.D.   On: 04/02/2023 20:31   CT HEAD WO CONTRAST  Result Date: 04/02/2023 CLINICAL DATA:  Altered mental status. EXAM: CT HEAD WITHOUT CONTRAST TECHNIQUE: Contiguous axial images were obtained from the base of the skull through the vertex without intravenous contrast. RADIATION DOSE REDUCTION: This exam was performed according to the departmental dose-optimization program which includes automated exposure control, adjustment of the mA and/or kV according to patient size and/or use of iterative reconstruction technique. COMPARISON:  January 06, 2023 FINDINGS: Brain: There is mild cerebral atrophy with widening of the extra-axial spaces and ventricular dilatation. There are areas of decreased attenuation within the white matter tracts of the supratentorial brain, consistent with microvascular disease changes. Vascular: There is marked severity bilateral cavernous carotid artery calcification. Skull: Normal. Negative for fracture or focal lesion. Sinuses/Orbits: No acute finding. Other: None. IMPRESSION: 1. Generalized cerebral atrophy with chronic white matter small vessel ischemic  changes. 2. No acute intracranial abnormality. Electronically Signed   By: Aram Candela M.D.   On: 04/02/2023 19:53   ECHOCARDIOGRAM COMPLETE  Result Date: 03/22/2023    ECHOCARDIOGRAM REPORT   Patient Name:   Laurie Fox Date of Exam: 03/22/2023 Medical Rec #:  [id number]        Height:       63.0 in Accession #:    [id number]       Weight:       149.3 lb Date of Birth:  [birth year]        BSA:          1.707 m Patient Age:    89 years         BP:           206/61 mmHg Patient Gender: F                HR:           59 bpm. Exam Location:  Inpatient Procedure: 2D Echo, 3D Echo, Color Doppler, Cardiac Doppler and Strain Analysis Indications:    I10 Hypertension  History:        Patient has prior history of Echocardiogram examinations, most                 recent 01/11/2023. Risk Factors:Hypertension and Diabetes. Breast  Physician Discharge Summary  Laurie Fox ZOX:[id number] DOB: 1932/01/01 DOA: 04/15/2023  PCP: Shireen Quan, DO  Admit date: 04/15/2023 Discharge date: 04/19/2023  Admitted From: Home Disposition:  Home  Recommendations for Outpatient Follow-up:  Follow up with PCP in 1-2 weeks Please obtain BMP and TSH and free T4 in 6 weeks postdischarge.  Home Health:Yes Equipment/Devices:None  Discharge Condition:Stable CODE STATUS:Full Diet recommendation: Heart Healthy   Brief/Interim Summary:  89 y.o. female past medical history of chronic diastolic heart failure, diabetes mellitus type 2, CAD, essential hypertension, chronic kidney disease stage IIIb remote history of breast cancer recently discharged from the hospital 04/09/2023 for syncope and  respiratory failure discharged on 2 L of oxygen.  Comes in with elevated BNP JVD lower extremity edema bilateral pleural effusion started on IV Lasix admitted for acute heart failure she A-fib with RVR started on IV Cardizem drip converted back to sinus rhythm transition to metoprolol continue on Eliquis.    Discharge Diagnoses:  Principal Problem:   Acute on chronic diastolic (congestive) heart failure (HCC) Active Problems:   HTN (hypertension)   Type 2 diabetes mellitus with chronic kidney disease, without long-term current use of insulin (HCC)   New onset atrial fibrillation (HCC)   Chronic kidney disease, stage 3b (HCC)  Acute on chronic diastolic heart failure: Elevated BNP bilateral pleural effusions 2D echo showed an EF of 55% with grade 2 diastolic dysfunction. Lower extremity Doppler was negative for DVT, due to her lower extremity swelling amlodipine was discontinued. She was started on IV Lasix she diuresed about 6.5 L. She was also started on her home dose of Jardiance. Her creatinine remained stable she will continue Lasix daily at home, continue ARB, hydralazine,  and Imdur. She will go home on oxygen on 2 L for the next 2 weeks.   Hopefully she will be weaned off as an outpatient  New onset atrial fibrillation: With a chads Vascor greater than 5. She was started on metoprolol and Eliquis she has been rate controlled she continue Eliquis as an outpatient.  Controlled diabetes mellitus type 2 with hyperglycemia/chronic kidney disease without long-term use of insulin: She will continue Jardiance at home she required minimal insulin in house. No changes made to her medication.  Anemia of chronic disease/microcytic anemia: She received IV iron and her hemoglobin remained stable follow-up with PCP as an outpatient.  Chronic kidney stage IIIb: Creatinine remained at baseline.  Hyperlipidemia: No changes made to her medication.  Abnormal TSH: Will need to be repeated in 4 to 6 weeks as an outpatient.  Prolonged QTc: Her potassium was kept greater than 4 magnesium greater than 2.  Advance care planning: She remains a full code  Obesity: Noted.  Discharge Instructions  Discharge Instructions     Diet - low sodium heart healthy   Complete by: As directed    Increase activity slowly   Complete by: As directed       Allergies as of 04/19/2023       Reactions   Codeine Nausea And Vomiting   Other Reaction(s): dizziness   Fexofenadine    Other Reaction(s): h/a   Loteprednol Etabonate    Other Reaction(s): swelling & dificulty breathing, chest pain & dizziness   Sulfa Antibiotics Rash        Medication List     STOP taking these medications    amLODipine 10 MG tablet Commonly known as: NORVASC   ASCORBIC ACID PO   furosemide 40 MG tablet Commonly known as: LASIX

## 2023-04-19 NOTE — [place] FL2 (Signed)
[place] MEDICAID FL2 LEVEL OF CARE FORM     IDENTIFICATION  Patient Name: Laurie Fox Birthdate: 1932/06/19 Sex: female Admission Date (Current Location): 04/15/2023  [facility] [place] Number:  Producer, television/film/video and Address:  [facility],  [address] [place], [place] [address]      Provider Number: [id number]  Attending Physician Name and Address:  David Stall, Darin Engels, MD  Relative Name and Phone Number:  Fransisca Kaufmann    Current Level of Care: Hospital Recommended Level of Care: Assisted Living Facility Prior Approval Number:    Date Approved/Denied:   PASRR Number: [id number]  Discharge Plan: Other (Comment) [name])    Current Diagnoses: Patient Active Problem List   Diagnosis Date Noted   Acute on chronic diastolic (congestive) heart failure (HCC) 04/16/2023   New onset atrial fibrillation (HCC) 04/16/2023   Chronic kidney disease, stage 3b (HCC) 04/16/2023   Pneumonia due to infectious organism 04/03/2023   Near syncope 04/02/2023   Malnutrition of moderate degree 01/12/2023   Closed displaced fracture of right femoral neck (HCC) 01/10/2023   HTN (hypertension) 01/10/2023   Type 2 diabetes mellitus with chronic kidney disease, without long-term current use of insulin (HCC) 01/10/2023   Hip fracture (HCC) 01/10/2023   Breast cancer of upper-inner quadrant of right female breast (HCC) 07/17/2013    Orientation RESPIRATION BLADDER Height & Weight     Self, Time, Situation, Place  O2 Incontinent Weight: 72.2 kg Height:  5\' 2"  (157.5 cm)  BEHAVIORAL SYMPTOMS/MOOD NEUROLOGICAL BOWEL NUTRITION STATUS      Continent Diet (Regular)  AMBULATORY STATUS COMMUNICATION OF NEEDS Skin     Verbally                         Personal Care Assistance Level of Assistance  Bathing, Feeding, Dressing   Feeding assistance: Limited assistance Dressing Assistance: Limited assistance     Functional Limitations Info  Sight, Hearing, Speech  Sight Info: Impaired (eyeglasses) Hearing Info: Impaired Speech Info: Impaired    SPECIAL CARE FACTORS FREQUENCY  PT (By licensed PT), OT (By licensed OT)     PT Frequency: Home Health OT Frequency: Home Health            Contractures Contractures Info: Not present    Additional Factors Info  Code Status, Allergies Code Status Info: Full Allergies Info: Codeine, Fexofenadine, Loteprednol Etabonate, Sulfa Antibiotics           Current Medications (04/19/2023):  This is the current hospital active medication list Current Facility-Administered Medications  Medication Dose Route Frequency Provider Last Rate Last Admin   acetaminophen (TYLENOL) tablet 650 mg  650 mg Oral Q6H PRN Dow Adolph N, DO       anastrozole (ARIMIDEX) tablet 1 mg  1 mg Oral Daily [place], Carole N, DO   1 mg at 04/19/23 0933   apixaban (ELIQUIS) tablet 5 mg  5 mg Oral BID Cindi Carbon, RPH   5 mg at 04/19/23 0932   empagliflozin (JARDIANCE) tablet 10 mg  10 mg Oral Daily Candelaria Stagers T, MD   10 mg at 04/19/23 0932   famotidine (PEPCID) tablet 20 mg  20 mg Oral Daily Dow Adolph N, DO   20 mg at 04/19/23 6606   furosemide (LASIX) tablet 20 mg  20 mg Oral Daily Marinda Elk, MD   20 mg at 04/19/23 0933   hydrALAZINE (APRESOLINE) injection 10 mg  10 mg Intravenous  Q4H PRN Anthoney Harada, NP   10 mg at 04/19/23 0142   insulin aspart (novoLOG) injection 0-5 Units  0-5 Units Subcutaneous QHS Candelaria Stagers T, MD   1 Units at 04/18/23 2208   insulin aspart (novoLOG) injection 0-9 Units  0-9 Units Subcutaneous TID WC Candelaria Stagers T, MD   1 Units at 04/19/23 0800   irbesartan (AVAPRO) tablet 150 mg  150 mg Oral Daily Marinda Elk, MD       isosorbide mononitrate (IMDUR) 24 hr tablet 30 mg  30 mg Oral Daily [place], Carole N, DO   30 mg at 04/19/23 0932   melatonin tablet 5 mg  5 mg Oral QHS PRN Dow Adolph N, DO       metoprolol tartrate (LOPRESSOR) tablet 25 mg  25 mg Oral BID Candelaria Stagers T, MD   25 mg  at 04/19/23 0932   polyethylene glycol (MIRALAX / GLYCOLAX) packet 17 g  17 g Oral Daily PRN Dow Adolph N, DO       potassium chloride SA (KLOR-CON M) CR tablet 40 mEq  40 mEq Oral BID Marinda Elk, MD       prochlorperazine (COMPAZINE) injection 5 mg  5 mg Intravenous Q6H PRN Dow Adolph N, DO       simvastatin (ZOCOR) tablet 20 mg  20 mg Oral q1800 Dow Adolph N, DO   20 mg at 04/18/23 1748     Discharge Medications: Please see discharge summary for a list of discharge medications.  Relevant Imaging Results:  Relevant Lab Results:   Additional Information ss#[id number]  Elyce Zollinger, Olegario Messier, [place]

## 2023-04-19 NOTE — TOC Transition Note (Signed)
Transition of Care [facility]) - CM/SW Discharge Note   Patient Details  Name: Laurie Fox MRN: [id number] Date of Birth: 11/15/1931  Transition of [facility]) CM/SW Contact:  Lanier Clam, RN Phone Number: 04/19/2023, 12:01 PM   Clinical Narrative:  d/c summary faxed to [facility] rep Eddie North accepted back going to rm#[phone],report tel#[phone]. [employer] rep Mitch aware of home 02 orders already active. [facility] rep Brandi HHPT/OT/aide.PTAR called. No further CM needs.     Final next level of care: Assisted Living Barriers to Discharge: No Barriers Identified   Patient Goals and CMS Choice CMS Medicare.gov Compare Post Acute Care list provided to:: Patient Represenative (must comment) (Michael(son)) Choice offered to / list presented to : Adult Children  Discharge Placement     Existing PASRR number confirmed : 04/15/23          Patient chooses bed at:  [name])   Name of family member notified: Chales Abrahams Kluger Patient and family notified of of transfer: 04/19/23  Discharge Plan and Services Additional resources added to the After Visit Summary for     Discharge Planning Services: CM Consult                      HH Arranged: PT, OT, Nurse's Aide HH Agency: [facility] Date [facility] Agency Contacted: 04/19/23 Time HH Agency Contacted: 1200 Representative spoke with at [facility] Agency: Merry Proud  Social Determinants of Health (SDOH) Interventions SDOH Screenings   Food Insecurity: No Food Insecurity (04/16/2023)  Housing: Low Risk  (04/16/2023)  Transportation Needs: No Transportation Needs (04/16/2023)  Utilities: Not At Risk (04/16/2023)  Tobacco Use: Unknown (04/15/2023)     Readmission Risk Interventions    04/16/2023    3:14 PM 01/11/2023    2:10 PM  Readmission Risk Prevention Plan  Transportation Screening Complete Complete  PCP or Specialist Appt within 5-7 Days  Complete  PCP or Specialist Appt within 3-5 Days Complete   Home Care  Screening  Complete  Medication Review (RN CM)  Complete  HRI or Home Care Consult Complete   Social Work Consult for Recovery Care Planning/Counseling Complete   Palliative Care Screening Not Applicable   Medication Review Oceanographer) Complete

## 2023-04-24 ENCOUNTER — Other Ambulatory Visit: Payer: Self-pay

## 2023-05-17 ENCOUNTER — Telehealth: Payer: Self-pay | Admitting: Cardiology

## 2023-05-17 NOTE — Telephone Encounter (Signed)
Called and spoke with Victorino Dike at [facility] concerning patient. She reports patient still having shortness of breath, edema bilaterally but no oozing noted today and confusion. Pt is on 2 liters of O2 at this time but will not keep it on consistently.She also stated pt legs are red but not warm to touch. Pt was seen by Home health nurse yesterday. Her daughter called to advised of her current state and asked if pt is on Lasix. Advised her Lasix is not on current medication list. Attempted to call [name] back and recommend pt be seen in the ED. Will try calling back.

## 2023-05-17 NOTE — Telephone Encounter (Signed)
Attempting to call [name] to speak to the nurse taking care of pt with no answer. Left message to return call.

## 2023-05-17 NOTE — Telephone Encounter (Signed)
Pt c/o swelling: STAT is pt has developed SOB within 24 hours  How much weight have you gained and in what time span?  Laurie Fox states patient has actually lost weight since she saw her. On 92/23 she weighed 158 lbs and yesterday when she saw patient she weighed 149 lbs.  If swelling, where is the swelling located?  +2 pitting edema in right leg +3 pitting edema with moderate weeping in left leg  Are you currently taking a fluid pill?  Furosemide 20 MG once daily  Are you currently SOB?   Do you have a log of your daily weights (if so, list)?   Have you gained 3 pounds in a day or 5 pounds in a week?   Have you traveled recently?  No--penny states she tried to advise patient to elevate her legs but she does not comprehend. She states a call may be returned to her and/or [name] [phone]) with advisement.

## 2023-05-17 NOTE — Telephone Encounter (Signed)
Called and spoke with patient's daughter who report the nurse form her mothers PCP office called to report patient was having SOB, leg swelling and a lot of oozing. Attempted to call facility where pt is living but they were in a meeting. Will call back to speak with nurse.

## 2023-05-19 NOTE — Progress Notes (Unsigned)
Cardiology Clinic Note   Patient Name: Laurie Fox Date of Encounter: 05/21/2023  Primary Care Provider:  Shireen Quan, DO Primary Cardiologist: Needs to be established-seen by Dr. Elberta Fox and Dr. Bjorn Fox during recent hospitalization  Patient Profile    89 year old female with history of hypertension, breast cancer, hyperlipidemia, type 2 diabetes, chronic grade 2 diastolic CHF, severe pulmonary hypertension, and chronic kidney disease stage III.  Was seen by Dr. Sharrell Fox on 04/05/2023 during recent hospitalization with admission for syncope and bradycardia.  But on initial evaluation in ED she had A-fib RVR and converted to normal sinus rhythm on diltiazem drip.    She apparently had transient unresponsiveness in her primary care provider's office and was sent to the ED.  She is found to be very hypertensive with systolic blood pressure 230 mmHg.  She was ruled out for CVA.  She did have a sinus pause which they felt was related to vagal episode in the setting of getting choked up on pills she was taking earlier that day causing her to pass out.  She was not found to be a candidate for pacemaker insertion, and AV nodal blocking agents were recommended to be avoided.  She was found to have aspiration pneumonia which was treated with abx.  She was also noted to be hypokalemic with potassium of 3.1 which was repleted.  Past Medical History    Past Medical History:  Diagnosis Date   Allergy    Arthritis    knees, osteo, shoulders,fingers   Breast cancer (HCC) 07/15/13   right breast    Diabetes mellitus without complication (HCC)    type II   GERD (gastroesophageal reflux disease)    History of radiation therapy 09/15/13-10/12/13   right breast   Hypertension    Personal history of radiation therapy    Wears glasses    Past Surgical History:  Procedure Laterality Date   BREAST BIOPSY Right 07/15/13   bx=mass 1 0'clock, invasive mammary ca, mammary ca in situ   BREAST  LUMPECTOMY     BREAST LUMPECTOMY WITH NEEDLE LOCALIZATION AND AXILLARY SENTINEL LYMPH NODE BX Right 08/11/2013   Procedure: RIGHT BREAST LUMPECTOMY WITH NEEDLE LOCALIZATION AND AXILLARY SENTINEL LYMPH NODE BIOPSY;  Surgeon: Almond Lint, MD;  Location: [facility];  Service: General;  Laterality: Right;   COLONOSCOPY     DILATION AND CURETTAGE OF UTERUS     FRACTURE SURGERY  2006   lt foot   KNEE SURGERY  [phone]   lt and rt knee scopes   TONSILLECTOMY     TOTAL HIP ARTHROPLASTY Right 01/12/2023   Procedure: TOTAL HIP ARTHROPLASTY ANTERIOR APPROACH;  Surgeon: Samson Frederic, MD;  Location: [facility] OR;  Service: Orthopedics;  Laterality: Right;    Allergies  Allergies  Allergen Reactions   Codeine Nausea And Vomiting    Other Reaction(s): dizziness   Fexofenadine     Other Reaction(s): h/a   Loteprednol Etabonate     Other Reaction(s): swelling & dificulty breathing, chest pain & dizziness   Sulfa Antibiotics Rash    History of Present Illness    Laurie Fox comes to the office today status post hospitalization after having a syncopal episode and a period of unresponsiveness in her primary care physician's office.  She had choked on some pills and they believe this was related to vasovagal episode.  She is also found to have A-fib RVR started on diltiazem drip and converted back to normal sinus rhythm.  She was continued  on Eliquis.  She was seen by EP and was not a candidate for pacemaker.  She was ruled out for CVA.  She also has failure to thrive according to her daughter has not been able to take care of herself with plan skilled nursing facility placement.  Echocardiogram revealed grade 2 diastolic dysfunction with preserved systolic function EF 55 to 60%..  She was discharged on 04/19/2023.   She is currently residing at [facility] in assisted living.  She remains on oxygen via nasal cannula and would like to have a portable tank so that she can go into the dining room  and have dinner with the other residents.  Currently she is on a very large tank which she states is cumbersome. She has a long line for that nasal cannula which she is underfoot entangled.  She is undergoing physical rehabilitation and is slowly making progress.  Her appetite has improved some.  She has had some nosebleeds on occasion which is likely related to the O2 via nasal cannula drying out nasal passages.  She offers no cardiac complaints at this time.  Home Medications    Current Outpatient Medications  Medication Sig Dispense Refill   acetaminophen (TYLENOL) 500 MG tablet Take 1,000 mg by mouth every 8 (eight) hours as needed for moderate pain, fever or headache.     anastrozole (ARIMIDEX) 1 MG tablet TAKE 1 TABLET BY MOUTH  DAILY 90 tablet 3   apixaban (ELIQUIS) 5 MG TABS tablet Take 1 tablet (5 mg total) by mouth 2 (two) times daily. 60 tablet 0   empagliflozin (JARDIANCE) 10 MG TABS tablet Take 1 tablet (10 mg total) by mouth daily. 30 tablet 0   famotidine (PEPCID) 20 MG tablet Take 20 mg by mouth daily.     furosemide (LASIX) 20 MG tablet Take 20 mg by mouth daily.     hydrALAZINE (APRESOLINE) 100 MG tablet Take 1 tablet (100 mg total) by mouth every 8 (eight) hours. 90 tablet 0   irbesartan (AVAPRO) 150 MG tablet Take 1 tablet (150 mg total) by mouth daily. 30 tablet 0   isosorbide mononitrate (IMDUR) 30 MG 24 hr tablet Take 1 tablet (30 mg total) by mouth daily. 30 tablet 0   melatonin 3 MG TABS tablet Take 3 mg by mouth at bedtime.     metFORMIN (GLUCOPHAGE) 1000 MG tablet Take 1,000 mg by mouth 2 (two) times daily.     metoprolol tartrate (LOPRESSOR) 25 MG tablet Take 1 tablet (25 mg total) by mouth 2 (two) times daily. 60 tablet 0   Multiple Vitamins-Minerals (ONE-A-DAY WOMENS 50+) TABS Take 1 tablet by mouth daily in the afternoon.     simvastatin (ZOCOR) 20 MG tablet Take 20 mg by mouth daily.     No current facility-administered medications for this visit.     Family  History    Family History  Problem Relation Age of Onset   Alzheimer's disease Mother    Colon cancer Father    She indicated that her mother is deceased. She indicated that her father is deceased.  Social History    Social History   Socioeconomic History   Marital status: Widowed    Spouse name: Not on file   Number of children: Not on file   Years of education: Not on file   Highest education level: Not on file  Occupational History   Not on file  Tobacco Use   Smoking status: Never   Smokeless tobacco: Not on  file  Substance and Sexual Activity   Alcohol use: No   Drug use: No   Sexual activity: Not Currently  Other Topics Concern   Not on file  Social History Narrative   Not on file   Social Determinants of Health   Financial Resource Strain: Not on file  Food Insecurity: No Food Insecurity (04/16/2023)   Hunger Vital Sign    Worried About Running Out of Food in the Last Year: Never true    Ran Out of Food in the Last Year: Never true  Transportation Needs: No Transportation Needs (04/16/2023)   PRAPARE - Administrator, Civil Service (Medical): No    Lack of Transportation (Non-Medical): No  Physical Activity: Not on file  Stress: Not on file  Social Connections: Not on file  Intimate Partner Violence: Not At Risk (04/16/2023)   Humiliation, Afraid, Rape, and Kick questionnaire    Fear of Current or Ex-Partner: No    Emotionally Abused: No    Physically Abused: No    Sexually Abused: No     Review of Systems    General:  No chills, fever, night sweats or weight changes.  Cardiovascular:  No chest pain, dyspnea on exertion, chronic dependent edema, orthopnea, palpitations, paroxysmal nocturnal dyspnea. Dermatological: No rash, lesions/masses Respiratory: No cough, dyspnea Urologic: No hematuria, dysuria Abdominal:   No nausea, vomiting, diarrhea, bright red blood per rectum, melena, or hematemesis Neurologic:  No visual changes, wkns, changes  in mental status. All other systems reviewed and are otherwise negative except as noted above.   Physical Exam    VS:  BP (!) 168/52 (BP Location: Left Arm, Patient Position: Sitting, Cuff Size: Normal)   Pulse 60   Ht 5\' 2"  (1.575 m)   Wt 144 lb 3.2 oz (65.4 kg)   SpO2 95%   BMI 26.37 kg/m  , BMI Body mass index is 26.37 kg/m.     GEN: Well nourished, well developed, in no acute distress.  Sitting in a wheelchair HEENT: normal.   Neck: Supple, no JVD, carotid bruits, or masses. Cardiac: IRRR, no murmurs, rubs, or gallops. No clubbing, cyanosis, 2+ pretibial edema with some weeping noted.  Radials 2+  /DP/PT 2+ difficult to palpate due to edema Respiratory:  Respirations regular and unlabored, clear to auscultation bilaterally. GI: Soft, nontender, nondistended, BS + x 4. MS: no deformity or atrophy. Skin: warm and dry, no rash.  Pale Neuro:  Strength and sensation are intact.  Hard of hearing Psych: Normal affect.    Lab Results  Component Value Date   WBC 10.3 04/18/2023   HGB 8.6 (L) 04/18/2023   HCT 29.6 (L) 04/18/2023   MCV 74.2 (L) 04/18/2023   PLT 273 04/18/2023   Lab Results  Component Value Date   CREATININE 1.07 (H) 04/19/2023   BUN 21 04/19/2023   NA 138 04/19/2023   K 3.4 (L) 04/19/2023   CL 96 (L) 04/19/2023   CO2 32 04/19/2023   Lab Results  Component Value Date   ALT 14 04/16/2023   AST 16 04/16/2023   ALKPHOS 87 04/16/2023   BILITOT 0.8 04/16/2023   Lab Results  Component Value Date   CHOL 115 04/03/2023   HDL 47 04/03/2023   LDLCALC 56 04/03/2023   TRIG 61 04/03/2023   CHOLHDL 2.4 04/03/2023    Lab Results  Component Value Date   HGBA1C 6.7 (H) 01/10/2023     Review of Prior Studies     Echocardiogram  03/22/2023 1. Left ventricular ejection fraction, by estimation, is 55 to 60%. Left  ventricular ejection fraction by 3D volume is 55 %. The left ventricle has  normal function. The left ventricle has no regional wall motion   abnormalities. There is moderate  concentric left ventricular hypertrophy. Left ventricular diastolic  parameters are consistent with Grade II diastolic dysfunction  (pseudonormalization). The average left ventricular global longitudinal  strain is -17.2 %. The global longitudinal strain is   abnormal.   2. Right ventricular systolic function is normal. The right ventricular  size is moderately enlarged. There is severely elevated pulmonary artery  systolic pressure. The estimated right ventricular systolic pressure is  71.2 mmHg.   3. Left atrial size was severely dilated.   4. Right atrial size was moderately dilated.   5. The mitral valve is degenerative. Mild mitral valve regurgitation. No  evidence of mitral stenosis. Moderate to severe mitral annular  calcification.   6. The aortic valve is tricuspid. Aortic valve regurgitation is mild.  Aortic valve sclerosis is present, with no evidence of aortic valve  stenosis.   7. The inferior vena cava is dilated in size with <50% respiratory  variability, suggesting right atrial pressure of 15 mmHg  Assessment & Plan   1.  Paroxysmal atrial fibrillation: Remains on apixaban 5 mg twice daily.  Add aspirin 81 mg daily.  I will discontinue the aspirin.  Heart rate is well-controlled on metoprolol tartrate 25 mg twice daily.  I will check a CBC today as most recent CBC was done 1 month ago and she was anemic with a hemoglobin of 8.6.  2.  Chronic diastolic CHF: Dependent edema is noted with some weeping.  She is on Lasix 20 mg in the a.m. and is getting good results.  I have advised her to wear support hose.  She states she cannot get them on and they are very uncomfortable.  There are other support hose that are zippered which may be easier for her to wear.  Her daughter who is with her is going to investigate this.  Low-sodium diet is important.  She is also on hydralazine which could also add to dependent edema.  Keeping legs elevated is much  as possible would be helpful.  3.  Hypertension: Blood pressure is elevated today in the office.  Blood pressures are checked at [facility].  She states that they are much lower away from a providers office.  Continue isosorbide mononitrate, hydralazine 100 mg every 8 hours, metoprolol and irbesartan 150 mg  daily.  Goal of blood pressure 140/70.  She has a follow-up appoint with primary care next week.  If blood pressure is elevated may need to or increase irbesartan.  Low-sodium diet is recommended.  4.  Type 2 diabetes with chronic kidney disease: Checking BMET today.  She is on Jardiance 10 mg daily.  Most recent labs on 04/19/2023, creatinine 1.07 GFR 50.         Signed, Bettey Mare. Liborio Nixon, ANP, AACC   05/21/2023 4:12 PM      Office [phone] Fax [phone]  Notice: This dictation was prepared with Dragon dictation along with smaller phrase technology. Any transcriptional errors that result from this process are unintentional and may not be corrected upon review.

## 2023-05-19 NOTE — Telephone Encounter (Signed)
Agree, she needs to be seen.  Looks like has appointment with Joni Reining on 10/15.  However if worsening shortness of breath needs to go to ED

## 2023-05-21 ENCOUNTER — Encounter: Payer: Self-pay | Admitting: Adult Health

## 2023-05-21 ENCOUNTER — Ambulatory Visit: Payer: Medicare Other | Attending: Adult Health | Admitting: Adult Health

## 2023-05-21 VITALS — BP 168/52 | HR 60 | Ht 62.0 in | Wt 144.2 lb

## 2023-05-21 DIAGNOSIS — D649 Anemia, unspecified: Secondary | ICD-10-CM | POA: Diagnosis not present

## 2023-05-21 DIAGNOSIS — I5032 Chronic diastolic (congestive) heart failure: Secondary | ICD-10-CM | POA: Diagnosis not present

## 2023-05-21 DIAGNOSIS — Z79899 Other long term (current) drug therapy: Secondary | ICD-10-CM

## 2023-05-21 DIAGNOSIS — I10 Essential (primary) hypertension: Secondary | ICD-10-CM | POA: Diagnosis not present

## 2023-05-21 DIAGNOSIS — I480 Paroxysmal atrial fibrillation: Secondary | ICD-10-CM

## 2023-05-21 NOTE — Patient Instructions (Addendum)
Medication Instructions:  Stop Aspirin *If you need a refill on your cardiac medications before your next appointment, please call your pharmacy*   Lab Work: BMET, CBC If you have labs (blood work) drawn today and your tests are completely normal, you will receive your results only by: MyChart Message (if you have MyChart) OR A paper copy in the mail If you have any lab test that is abnormal or we need to change your treatment, we will call you to review the results.   Testing/Procedures: No Testing   Follow-Up: At [facility], you and your health needs are our priority.  As part of our continuing mission to provide you with exceptional heart care, we have created designated Provider Care Teams.  These Care Teams include your primary Cardiologist (physician) and Advanced Practice Providers (APPs -  Physician Assistants and Nurse Practitioners) who all work together to provide you with the care you need, when you need it.  We recommend signing up for the patient portal called "MyChart".  Sign up information is provided on this After Visit Summary.  MyChart is used to connect with patients for Virtual Visits (Telemedicine).  Patients are able to view lab/test results, encounter notes, upcoming appointments, etc.  Non-urgent messages can be sent to your provider as well.   To learn more about what you can do with MyChart, go to [email or web address].    Your next appointment:   6 month(s)  Provider:   Epifanio Lesches, MD

## 2023-05-22 LAB — BASIC METABOLIC PANEL
BUN/Creatinine Ratio: 15 (ref 12–28)
BUN: 18 mg/dL (ref 8–27)
CO2: 30 mmol/L — ABNORMAL HIGH (ref 20–29)
Calcium: 9.7 mg/dL (ref 8.7–10.3)
Chloride: 97 mmol/L (ref 96–106)
Creatinine, Ser: 1.22 mg/dL — ABNORMAL HIGH (ref 0.57–1.00)
Glucose: 179 mg/dL — ABNORMAL HIGH (ref 70–99)
Potassium: 4 mmol/L (ref 3.5–5.2)
Sodium: 143 mmol/L (ref 134–144)
eGFR: 42 mL/min/{1.73_m2} — ABNORMAL LOW (ref >59)

## 2023-05-22 LAB — CBC
Hematocrit: 33 % — ABNORMAL LOW (ref 34.0–46.6)
Hemoglobin: 9.5 g/dL — ABNORMAL LOW (ref 11.1–15.9)
MCH: 23.2 pg — ABNORMAL LOW (ref 26.6–33.0)
MCHC: 28.8 g/dL — ABNORMAL LOW (ref 31.5–35.7)
MCV: 81 fL (ref 79–97)
Platelets: 245 10*3/uL (ref 150–450)
RBC: 4.1 x10E6/uL (ref 3.77–5.28)
RDW: 21.6 % — ABNORMAL HIGH (ref 11.7–15.4)
WBC: 5.1 10*3/uL (ref 3.4–10.8)

## 2023-05-24 ENCOUNTER — Telehealth: Payer: Self-pay

## 2023-05-24 NOTE — Telephone Encounter (Addendum)
Called patient regarding results. Left message for patient to call office.----- Message from Joni Reining sent at 05/23/2023  7:25 AM EDT ----- I have reviewed her labs. The kidney function has worsened since last blood draw. Would decrease lasix to take every other day. Hgb is improved from last blood draw, from  8.6 to 9.5.  Continue current regimen as directed except for lasix dosing,.

## 2023-05-29 ENCOUNTER — Telehealth: Payer: Self-pay

## 2023-05-29 NOTE — Telephone Encounter (Addendum)
Results viewed by patient via Mychart.----- Message from Joni Reining sent at 05/23/2023  7:25 AM EDT ----- I have reviewed her labs. The kidney function has worsened since last blood draw. Would decrease lasix to take every other day. Hgb is improved from last blood draw, from  8.6 to 9.5.  Continue current regimen as directed except for lasix dosing,.

## 2023-07-16 ENCOUNTER — Ambulatory Visit: Payer: Medicare Other | Admitting: Podiatry

## 2023-07-16 DIAGNOSIS — B351 Tinea unguium: Secondary | ICD-10-CM

## 2023-07-16 DIAGNOSIS — M79675 Pain in left toe(s): Secondary | ICD-10-CM

## 2023-07-16 DIAGNOSIS — E1151 Type 2 diabetes mellitus with diabetic peripheral angiopathy without gangrene: Secondary | ICD-10-CM | POA: Diagnosis not present

## 2023-07-16 DIAGNOSIS — L03116 Cellulitis of left lower limb: Secondary | ICD-10-CM

## 2023-07-16 DIAGNOSIS — M79674 Pain in right toe(s): Secondary | ICD-10-CM

## 2023-07-16 DIAGNOSIS — D492 Neoplasm of unspecified behavior of bone, soft tissue, and skin: Secondary | ICD-10-CM | POA: Diagnosis not present

## 2023-07-16 MED ORDER — CEPHALEXIN 500 MG PO CAPS: 500.0000 mg | ORAL_CAPSULE | Freq: Two times a day (BID) | ORAL | 0 refills | Status: AC

## 2023-07-16 NOTE — Progress Notes (Unsigned)
Subjective:  Patient ID: Laurie Fox, female    DOB: [birth year],  MRN: [id number]  Laurie Fox presents to clinic today for:  Chief Complaint  Patient presents with   Nail Problem    She is here to establish and get her nails trimmed, she reports she is diabetic and would like to her feet checked out, She was seen at [facility] about 2 weeks ago for what she was told was  boil on her left lower leg, Its causing a lot of pain on the left foot, some numbness and swelling in the foot. She thinks she may have 1 more dose of ABT.    Patient notes nails are thick and elongated, causing pain in shoe gear when ambulating.  Patient notes she had a boil on the medial left leg which was drained recently.  As she is almost finished with the antibiotics for this but she is concerned because it is starting to get painful again and the redness is coming back.  She does not have any upcoming appointments for this condition.  As she may need to follow-up with urgent care but is not sure.  Patient resides in a residential facility  PCP is Lind Covert, MD.  Last seen 07/01/23.  Past Medical History:  Diagnosis Date   Allergy    Arthritis    knees, osteo, shoulders,fingers   Breast cancer (HCC) 07/15/13   right breast    Diabetes mellitus without complication (HCC)    type II   GERD (gastroesophageal reflux disease)    History of radiation therapy 09/15/13-10/12/13   right breast   Hypertension    Personal history of radiation therapy    Wears glasses     Allergies  Allergen Reactions   Loteprednol Etabonate     Other Reaction(s): swelling & dificulty breathing, chest pain & dizziness   Fexofenadine     Other Reaction(s): h/a   Codeine Nausea And Vomiting    Other Reaction(s): dizziness   Sulfa Antibiotics Rash   Objective:  Laurie Fox is a pleasant 89 y.o. female in NAD. AAO x 3.  Vascular Examination: Patient has palpable DP pulse, absent PT pulse bilateral.  Delayed capillary  refill bilateral toes.  Sparse digital hair bilateral.  Proximal to distal cooling WNL bilateral.  Mild edema left leg with localized erythema around the skin lesion on the medial aspect of the middle one third of the leg.  There is an escharotic lesion in the center with no active drainage at this time.  It appears crusty.  Dermatological Examination: Interspaces are clear with no open lesions noted bilateral.  Skin is shiny and atrophic bilateral.  Nails are 3-5mm thick, with yellowish/brown discoloration, subungual debris and distal onycholysis x10.  There is pain with compression of nails x10.       Latest Ref Rng & Units 01/10/2023    3:39 PM  Hemoglobin A1C  Hemoglobin-A1c 4.8 - 5.6 % 6.7    Patient qualifies for at-risk foot care because of diabetes with PVD.  Assessment/Plan: 1. Pain due to onychomycosis of toenails of both feet   2. Cellulitis of left leg   3. Skin neoplasm   4. Type II diabetes mellitus with peripheral circulatory disorder (HCC)     Meds ordered this encounter  Medications   cephALEXin (KEFLEX) 500 MG capsule    Sig: Take 1 capsule (500 mg total) by mouth 2 (two) times daily.    Dispense:  14 capsule  Refill:  0   Mycotic nails x10 were sharply debrided with sterile nail nippers and power debriding burr to decrease bulk and length.  Informed the patient that we cannot treat the boil as this is out of our licensed area of practice in [place].  She will either need to follow-up with her PCP or with urgent care for this.  However, her Keflex was renewed so that she can have continued antibiotic coverage until she can be seen by 1 of these providers.  Encouraged patient to have the area biopsied if it does not resolve.  Return in about 3 months (around 10/14/2023) for RFC.   Clerance Lav, DPM, FACFAS [facility]     [address] [place], [place] [address]                Office [phone]   Fax [phone]

## 2023-08-14 ENCOUNTER — Ambulatory Visit (HOSPITAL_BASED_OUTPATIENT_CLINIC_OR_DEPARTMENT_OTHER)
Admission: RE | Admit: 2023-08-14 | Discharge: 2023-08-14 | Disposition: A | Discharge status: Home / Self Care | Payer: Medicare Other | Source: Ambulatory Visit | Attending: Physician Assistant | Admitting: Physician Assistant

## 2023-08-14 ENCOUNTER — Other Ambulatory Visit (HOSPITAL_BASED_OUTPATIENT_CLINIC_OR_DEPARTMENT_OTHER): Payer: Self-pay | Admitting: Physician Assistant

## 2023-08-14 DIAGNOSIS — M79605 Pain in left leg: Secondary | ICD-10-CM | POA: Diagnosis present

## 2023-09-09 DIAGNOSIS — R2689 Other abnormalities of gait and mobility: Secondary | ICD-10-CM | POA: Diagnosis not present

## 2023-09-09 DIAGNOSIS — M6259 Muscle wasting and atrophy, not elsewhere classified, multiple sites: Secondary | ICD-10-CM | POA: Diagnosis not present

## 2023-09-09 DIAGNOSIS — R278 Other lack of coordination: Secondary | ICD-10-CM | POA: Diagnosis not present

## 2023-09-10 DIAGNOSIS — I480 Paroxysmal atrial fibrillation: Secondary | ICD-10-CM | POA: Diagnosis not present

## 2023-09-10 DIAGNOSIS — M6259 Muscle wasting and atrophy, not elsewhere classified, multiple sites: Secondary | ICD-10-CM | POA: Diagnosis not present

## 2023-09-10 DIAGNOSIS — I10 Essential (primary) hypertension: Secondary | ICD-10-CM | POA: Diagnosis not present

## 2023-09-10 DIAGNOSIS — N1831 Chronic kidney disease, stage 3a: Secondary | ICD-10-CM | POA: Diagnosis not present

## 2023-09-10 DIAGNOSIS — E1122 Type 2 diabetes mellitus with diabetic chronic kidney disease: Secondary | ICD-10-CM | POA: Diagnosis not present

## 2023-09-10 DIAGNOSIS — D649 Anemia, unspecified: Secondary | ICD-10-CM | POA: Diagnosis not present

## 2023-09-10 DIAGNOSIS — R2681 Unsteadiness on feet: Secondary | ICD-10-CM | POA: Diagnosis not present

## 2023-09-12 DIAGNOSIS — M6259 Muscle wasting and atrophy, not elsewhere classified, multiple sites: Secondary | ICD-10-CM | POA: Diagnosis not present

## 2023-09-12 DIAGNOSIS — R278 Other lack of coordination: Secondary | ICD-10-CM | POA: Diagnosis not present

## 2023-09-12 DIAGNOSIS — R2689 Other abnormalities of gait and mobility: Secondary | ICD-10-CM | POA: Diagnosis not present

## 2023-09-13 DIAGNOSIS — R2681 Unsteadiness on feet: Secondary | ICD-10-CM | POA: Diagnosis not present

## 2023-09-13 DIAGNOSIS — M6259 Muscle wasting and atrophy, not elsewhere classified, multiple sites: Secondary | ICD-10-CM | POA: Diagnosis not present

## 2023-09-16 DIAGNOSIS — R2689 Other abnormalities of gait and mobility: Secondary | ICD-10-CM | POA: Diagnosis not present

## 2023-09-16 DIAGNOSIS — R278 Other lack of coordination: Secondary | ICD-10-CM | POA: Diagnosis not present

## 2023-09-16 DIAGNOSIS — M6259 Muscle wasting and atrophy, not elsewhere classified, multiple sites: Secondary | ICD-10-CM | POA: Diagnosis not present

## 2023-09-17 DIAGNOSIS — R2681 Unsteadiness on feet: Secondary | ICD-10-CM | POA: Diagnosis not present

## 2023-09-17 DIAGNOSIS — M6259 Muscle wasting and atrophy, not elsewhere classified, multiple sites: Secondary | ICD-10-CM | POA: Diagnosis not present

## 2023-09-19 DIAGNOSIS — R278 Other lack of coordination: Secondary | ICD-10-CM | POA: Diagnosis not present

## 2023-09-19 DIAGNOSIS — R2689 Other abnormalities of gait and mobility: Secondary | ICD-10-CM | POA: Diagnosis not present

## 2023-09-19 DIAGNOSIS — M6259 Muscle wasting and atrophy, not elsewhere classified, multiple sites: Secondary | ICD-10-CM | POA: Diagnosis not present

## 2023-09-20 DIAGNOSIS — R2681 Unsteadiness on feet: Secondary | ICD-10-CM | POA: Diagnosis not present

## 2023-09-20 DIAGNOSIS — M6259 Muscle wasting and atrophy, not elsewhere classified, multiple sites: Secondary | ICD-10-CM | POA: Diagnosis not present

## 2023-09-23 DIAGNOSIS — R2689 Other abnormalities of gait and mobility: Secondary | ICD-10-CM | POA: Diagnosis not present

## 2023-09-23 DIAGNOSIS — R278 Other lack of coordination: Secondary | ICD-10-CM | POA: Diagnosis not present

## 2023-09-23 DIAGNOSIS — M6259 Muscle wasting and atrophy, not elsewhere classified, multiple sites: Secondary | ICD-10-CM | POA: Diagnosis not present

## 2023-09-24 DIAGNOSIS — M6259 Muscle wasting and atrophy, not elsewhere classified, multiple sites: Secondary | ICD-10-CM | POA: Diagnosis not present

## 2023-09-24 DIAGNOSIS — R2681 Unsteadiness on feet: Secondary | ICD-10-CM | POA: Diagnosis not present

## 2023-09-25 DIAGNOSIS — R2689 Other abnormalities of gait and mobility: Secondary | ICD-10-CM | POA: Diagnosis not present

## 2023-09-25 DIAGNOSIS — M6259 Muscle wasting and atrophy, not elsewhere classified, multiple sites: Secondary | ICD-10-CM | POA: Diagnosis not present

## 2023-09-25 DIAGNOSIS — R278 Other lack of coordination: Secondary | ICD-10-CM | POA: Diagnosis not present

## 2023-09-30 DIAGNOSIS — M6259 Muscle wasting and atrophy, not elsewhere classified, multiple sites: Secondary | ICD-10-CM | POA: Diagnosis not present

## 2023-09-30 DIAGNOSIS — R2689 Other abnormalities of gait and mobility: Secondary | ICD-10-CM | POA: Diagnosis not present

## 2023-09-30 DIAGNOSIS — R278 Other lack of coordination: Secondary | ICD-10-CM | POA: Diagnosis not present

## 2023-10-02 DIAGNOSIS — R2681 Unsteadiness on feet: Secondary | ICD-10-CM | POA: Diagnosis not present

## 2023-10-02 DIAGNOSIS — M6259 Muscle wasting and atrophy, not elsewhere classified, multiple sites: Secondary | ICD-10-CM | POA: Diagnosis not present

## 2023-10-04 DIAGNOSIS — M6259 Muscle wasting and atrophy, not elsewhere classified, multiple sites: Secondary | ICD-10-CM | POA: Diagnosis not present

## 2023-10-04 DIAGNOSIS — R2681 Unsteadiness on feet: Secondary | ICD-10-CM | POA: Diagnosis not present

## 2023-10-09 DIAGNOSIS — R2681 Unsteadiness on feet: Secondary | ICD-10-CM | POA: Diagnosis not present

## 2023-10-09 DIAGNOSIS — M6259 Muscle wasting and atrophy, not elsewhere classified, multiple sites: Secondary | ICD-10-CM | POA: Diagnosis not present

## 2023-10-14 ENCOUNTER — Ambulatory Visit: Payer: Medicare Other | Admitting: Podiatry

## 2023-10-16 DIAGNOSIS — R2681 Unsteadiness on feet: Secondary | ICD-10-CM | POA: Diagnosis not present

## 2023-10-16 DIAGNOSIS — M6259 Muscle wasting and atrophy, not elsewhere classified, multiple sites: Secondary | ICD-10-CM | POA: Diagnosis not present

## 2023-10-21 ENCOUNTER — Ambulatory Visit: Admitting: Podiatry

## 2023-10-29 ENCOUNTER — Encounter: Admitting: Podiatry

## 2023-10-29 ENCOUNTER — Ambulatory Visit: Admitting: Podiatry

## 2023-10-29 DIAGNOSIS — L989 Disorder of the skin and subcutaneous tissue, unspecified: Secondary | ICD-10-CM | POA: Diagnosis not present

## 2023-10-29 NOTE — Progress Notes (Signed)
Patient did not show for scheduled appointment today.

## 2023-11-05 ENCOUNTER — Encounter: Payer: Self-pay | Admitting: Podiatry

## 2023-11-05 ENCOUNTER — Ambulatory Visit: Admitting: Podiatry

## 2023-11-05 VITALS — Ht 62.0 in | Wt 144.2 lb

## 2023-11-05 DIAGNOSIS — M79674 Pain in right toe(s): Secondary | ICD-10-CM | POA: Diagnosis not present

## 2023-11-05 DIAGNOSIS — M79675 Pain in left toe(s): Secondary | ICD-10-CM

## 2023-11-05 DIAGNOSIS — B351 Tinea unguium: Secondary | ICD-10-CM

## 2023-11-05 NOTE — Progress Notes (Unsigned)
 Subjective:  Patient ID: Laurie Fox, female    DOB: August 24, 1931,  MRN: [id number]   Laurie Fox presents to clinic today for:  Chief Complaint  Patient presents with   Nail Problem    Pt is here for RFC.   Patient notes nails are thick, discolored, elongated and painful in shoegear when trying to ambulate.  Upon entering the exam room, the patient was not in the exam chair.  She was sitting in the guest chair with her shoes and socks on.  The patient was asked if there had been an issue or if everything was okay.  She stated that nothing was wrong.  She was then asked to return to the exam chair and remove her socks and shoes for her visit.  PCP is Laurie Covert, MD.  Past Medical History:  Diagnosis Date   Allergy    Arthritis    knees, osteo, shoulders,fingers   Breast cancer (HCC) 07/15/13   right breast    Diabetes mellitus without complication (HCC)    type II   GERD (gastroesophageal reflux disease)    History of radiation therapy 09/15/13-10/12/13   right breast   Hypertension    Personal history of radiation therapy    Wears glasses     Past Surgical History:  Procedure Laterality Date   BREAST BIOPSY Right 07/15/13   bx=mass 1 0'clock, invasive mammary ca, mammary ca in situ   BREAST LUMPECTOMY     BREAST LUMPECTOMY WITH NEEDLE LOCALIZATION AND AXILLARY SENTINEL LYMPH NODE BX Right 08/11/2013   Procedure: RIGHT BREAST LUMPECTOMY WITH NEEDLE LOCALIZATION AND AXILLARY SENTINEL LYMPH NODE BIOPSY;  Surgeon: Laurie Lint, MD;  Location: [facility];  Service: General;  Laterality: Right;   COLONOSCOPY     DILATION AND CURETTAGE OF UTERUS     FRACTURE SURGERY  2006   lt foot   KNEE SURGERY  [phone]   lt and rt knee scopes   TONSILLECTOMY     TOTAL HIP ARTHROPLASTY Right 01/12/2023   Procedure: TOTAL HIP ARTHROPLASTY ANTERIOR APPROACH;  Surgeon: Laurie Frederic, MD;  Location: [facility] OR;  Service: Orthopedics;  Laterality: Right;    Allergies   Allergen Reactions   Loteprednol Etabonate     Other Reaction(s): swelling & dificulty breathing, chest pain & dizziness   Fexofenadine     Other Reaction(s): h/a   Codeine Nausea And Vomiting    Other Reaction(s): dizziness   Sulfa Antibiotics Rash    Review of Systems: Negative except as noted in the HPI.  Objective:  Laurie Fox is a pleasant 89 y.o. female in NAD. AAO x 3.  Vascular Examination: Capillary refill time is 3-5 seconds to toes bilateral.  1/4 palpable pedal pulses b/l LE. Digital hair sparse b/l.  Skin temperature gradient WNL b/l. Marland Kitchen   Dermatological Examination: Pedal skin with decreased turgor, texture and tone b/l. No open wounds. No interdigital macerations b/l. Toenails x10 are 3mm thick, discolored, dystrophic with subungual debris. There is pain with compression of the nail plates.  They are elongated x10     Latest Ref Rng & Units 01/10/2023    3:39 PM  Hemoglobin A1C  Hemoglobin-A1c 4.8 - 5.6 % 6.7    Assessment/Plan: 1. Pain due to onychomycosis of toenails of both feet     The mycotic toenails were sharply debrided x10 with sterile nail nippers and a power debriding burr to decrease bulk/thickness and length.    Return  in about 3 months (around 02/04/2024) for [facility].   Laurie Fox, DPM, FACFAS [facility]     [address] [place], [place] [address]                Office [phone]  Fax [phone]

## 2024-03-02 DIAGNOSIS — I10 Essential (primary) hypertension: Secondary | ICD-10-CM | POA: Diagnosis not present

## 2024-03-02 DIAGNOSIS — Z01818 Encounter for other preprocedural examination: Secondary | ICD-10-CM | POA: Diagnosis not present

## 2024-03-02 DIAGNOSIS — I480 Paroxysmal atrial fibrillation: Secondary | ICD-10-CM | POA: Diagnosis not present

## 2024-03-10 ENCOUNTER — Encounter: Payer: Self-pay | Admitting: Podiatry

## 2024-03-10 ENCOUNTER — Ambulatory Visit: Admitting: Podiatry

## 2024-03-10 DIAGNOSIS — M79674 Pain in right toe(s): Secondary | ICD-10-CM | POA: Diagnosis not present

## 2024-03-10 DIAGNOSIS — E1151 Type 2 diabetes mellitus with diabetic peripheral angiopathy without gangrene: Secondary | ICD-10-CM | POA: Diagnosis not present

## 2024-03-10 DIAGNOSIS — B351 Tinea unguium: Secondary | ICD-10-CM

## 2024-03-10 DIAGNOSIS — M79675 Pain in left toe(s): Secondary | ICD-10-CM | POA: Diagnosis not present

## 2024-03-10 NOTE — Progress Notes (Signed)
 This patient returns to my office for at risk foot care.  This patient requires this care by a professional since this patient will be at risk due to having diabetes,CKD and coagulation defect.This patient is unable to cut nails herself since the patient cannot reach her nails.These nails are painful walking and wearing shoes.  This patient presents for at risk foot care today.  General Appearance  Alert, conversant and in no acute stress.  Vascular  Dorsalis pedis and posterior tibial  pulses are palpable  bilaterally.  Capillary return is within normal limits  bilaterally. Temperature is within normal limits  bilaterally.  Neurologic  Senn-Weinstein monofilament wire test within normal limits  bilaterally. Muscle power within normal limits bilaterally.  Nails Thick disfigured discolored nails with subungual debris  from hallux to fifth toes bilaterally. No evidence of bacterial infection or drainage bilaterally.  Orthopedic  No limitations of motion  feet .  No crepitus or effusions noted.  No bony pathology or digital deformities noted.  Skin  normotropic skin with no porokeratosis noted bilaterally.  No signs of infections or ulcers noted.     Onychomycosis  Pain in right toes  Pain in left toes  Consent was obtained for treatment procedures.   Mechanical debridement of nails 1-5  bilaterally performed with a nail nipper.  Filed with dremel without incident.    Return office visit    3 months                  Told patient to return for periodic foot care and evaluation due to potential at risk complications.   Cordella Bold DPM

## 2024-06-09 ENCOUNTER — Ambulatory Visit: Admitting: Podiatry

## 2024-06-09 ENCOUNTER — Encounter: Payer: Self-pay | Admitting: Podiatry

## 2024-06-09 DIAGNOSIS — M79674 Pain in right toe(s): Secondary | ICD-10-CM

## 2024-06-09 DIAGNOSIS — M79675 Pain in left toe(s): Secondary | ICD-10-CM

## 2024-06-09 DIAGNOSIS — B351 Tinea unguium: Secondary | ICD-10-CM

## 2024-06-09 DIAGNOSIS — E1151 Type 2 diabetes mellitus with diabetic peripheral angiopathy without gangrene: Secondary | ICD-10-CM

## 2024-06-09 NOTE — Progress Notes (Signed)
 This patient returns to my office for at risk foot care.  This patient requires this care by a professional since this patient will be at risk due to having diabetes,CKD and coagulation defect.This patient is unable to cut nails herself since the patient cannot reach her nails.These nails are painful walking and wearing shoes.  This patient presents for at risk foot care today.  General Appearance  Alert, conversant and in no acute stress.  Vascular  Dorsalis pedis and posterior tibial  pulses are palpable  bilaterally.  Capillary return is within normal limits  bilaterally. Temperature is within normal limits  bilaterally.  Neurologic  Senn-Weinstein monofilament wire test within normal limits  bilaterally. Muscle power within normal limits bilaterally.  Nails Thick disfigured discolored nails with subungual debris  from hallux to fifth toes bilaterally. No evidence of bacterial infection or drainage bilaterally.  Orthopedic  No limitations of motion  feet .  No crepitus or effusions noted.  No bony pathology or digital deformities noted.  Skin  normotropic skin with no porokeratosis noted bilaterally.  No signs of infections or ulcers noted.     Onychomycosis  Pain in right toes  Pain in left toes  Consent was obtained for treatment procedures.   Mechanical debridement of nails 1-5  bilaterally performed with a nail nipper.  Filed with dremel without incident.    Return office visit    3 months                  Told patient to return for periodic foot care and evaluation due to potential at risk complications.   Cordella Bold DPM

## 2024-09-08 ENCOUNTER — Ambulatory Visit: Admitting: Podiatry
